# Patient Record
Sex: Male | Born: 1945 | Race: White | Hispanic: No | Marital: Married | Smoking: Former smoker
Health system: Southern US, Community
[De-identification: ages and names within clinical notes are randomized; demographics above are authoritative.]

## PROBLEM LIST (undated history)

## (undated) DIAGNOSIS — C801 Malignant (primary) neoplasm, unspecified: Secondary | ICD-10-CM

## (undated) DIAGNOSIS — I10 Essential (primary) hypertension: Secondary | ICD-10-CM

## (undated) DIAGNOSIS — J449 Chronic obstructive pulmonary disease, unspecified: Secondary | ICD-10-CM

## (undated) DIAGNOSIS — G4730 Sleep apnea, unspecified: Secondary | ICD-10-CM

## (undated) HISTORY — PX: EYE SURGERY: SHX253

## (undated) HISTORY — PX: CHOLECYSTECTOMY: SHX55

---

## 2009-01-17 ENCOUNTER — Ambulatory Visit: Payer: Self-pay | Admitting: Ophthalmology

## 2009-01-29 ENCOUNTER — Ambulatory Visit: Payer: Self-pay | Admitting: Ophthalmology

## 2014-11-06 ENCOUNTER — Ambulatory Visit: Payer: Self-pay | Admitting: Ophthalmology

## 2014-11-20 ENCOUNTER — Ambulatory Visit: Payer: Self-pay | Admitting: Ophthalmology

## 2015-03-20 NOTE — Op Note (Signed)
PATIENT NAME:  James Hodge, James Hodge MR#:  [id number] DATE OF BIRTH:  02-16-1946  DATE OF PROCEDURE:  11/20/2014  PREOPERATIVE DIAGNOSIS:  Nuclear sclerotic cataract of the right eye.   POSTOPERATIVE DIAGNOSIS:  Nuclear sclerotic cataract of the right eye.   OPERATIVE PROCEDURE:  Cataract extraction by phacoemulsification with implant of intraocular lens to right eye.   SURGEON:  Birder Robson, MD.   ANESTHESIA:  1. Managed anesthesia care.  2. Topical tetracaine drops followed by 2% Xylocaine jelly applied in the preoperative holding area.   COMPLICATIONS:  None.   TECHNIQUE:   Stop and chop.    DESCRIPTION OF PROCEDURE:  The patient was examined and consented in the preoperative holding area where the aforementioned topical anesthesia was applied to the right eye and then brought back to the Operating Room where the right eye was prepped and draped in the usual sterile ophthalmic fashion and a lid speculum was placed. A paracentesis was created with the side port blade and the anterior chamber was filled with viscoelastic. A near clear corneal incision was performed with the steel keratome. A continuous curvilinear capsulorrhexis was performed with a cystotome followed by the capsulorrhexis forceps. Hydrodissection and hydrodelineation were carried out with BSS on a blunt cannula. The lens was removed in a stop and chop technique and the remaining cortical material was removed with the irrigation-aspiration handpiece. The capsular bag was inflated with viscoelastic and the Alcon SN60WF 24.0-diopter lens, serial number [id number] was placed in the capsular bag without complication. The remaining viscoelastic was removed from the eye with the irrigation-aspiration handpiece. The wounds were hydrated. The anterior chamber was flushed with Miostat and the eye was inflated to physiologic pressure. 0.1 mL of cefuroxime concentration 10 mg/mL was placed in the anterior chamber. The wounds were found to be  water tight. The eye was dressed with Vigamox. The patient was given protective glasses to wear throughout the day and a shield with which to sleep tonight. The patient was also given drops with which to begin a drop regimen today and will follow-up with me in one day.    ____________________________ Livingston Diones. Lamont Glasscock, MD wlp:bu D: 11/20/2014 13:56:07 ET T: 11/20/2014 14:55:48 ET JOB#: [id number]  cc: Doroteo Nickolson L. Aurelius Gildersleeve, MD, <Dictator> Livingston Diones Avenir Lozinski MD ELECTRONICALLY SIGNED 11/26/2014 9:19

## 2020-03-26 ENCOUNTER — Other Ambulatory Visit: Payer: Self-pay | Admitting: General Surgery

## 2020-04-10 ENCOUNTER — Other Ambulatory Visit: Payer: Self-pay

## 2020-04-10 ENCOUNTER — Other Ambulatory Visit
Admission: RE | Admit: 2020-04-10 | Discharge: 2020-04-10 | Disposition: A | Discharge status: Home / Self Care | Payer: Medicare HMO | Source: Ambulatory Visit | Attending: General Surgery | Admitting: General Surgery

## 2020-04-10 DIAGNOSIS — Z01812 Encounter for preprocedural laboratory examination: Secondary | ICD-10-CM | POA: Diagnosis present

## 2020-04-10 DIAGNOSIS — Z20822 Contact with and (suspected) exposure to covid-19: Secondary | ICD-10-CM | POA: Insufficient documentation

## 2020-04-11 ENCOUNTER — Encounter: Payer: Self-pay | Admitting: General Surgery

## 2020-04-11 LAB — SARS CORONAVIRUS 2 (TAT 6-24 HRS): SARS Coronavirus 2: NEGATIVE

## 2020-04-12 ENCOUNTER — Ambulatory Visit: Payer: Medicare HMO | Admitting: Certified Registered Nurse Anesthetist

## 2020-04-12 ENCOUNTER — Encounter: Payer: Self-pay | Admitting: General Surgery

## 2020-04-12 ENCOUNTER — Other Ambulatory Visit: Payer: Self-pay | Admitting: General Surgery

## 2020-04-12 ENCOUNTER — Other Ambulatory Visit: Payer: Self-pay

## 2020-04-12 ENCOUNTER — Encounter
Admission: RE | Disposition: A | Discharge status: Home / Self Care | Payer: Self-pay | Source: Ambulatory Visit | Attending: General Surgery

## 2020-04-12 ENCOUNTER — Ambulatory Visit
Admission: RE | Admit: 2020-04-12 | Discharge: 2020-04-12 | Disposition: A | Discharge status: Home / Self Care | Payer: Medicare HMO | Source: Ambulatory Visit | Attending: General Surgery | Admitting: General Surgery

## 2020-04-12 DIAGNOSIS — R140 Abdominal distension (gaseous): Secondary | ICD-10-CM

## 2020-04-12 DIAGNOSIS — D509 Iron deficiency anemia, unspecified: Secondary | ICD-10-CM | POA: Diagnosis not present

## 2020-04-12 DIAGNOSIS — J449 Chronic obstructive pulmonary disease, unspecified: Secondary | ICD-10-CM | POA: Diagnosis not present

## 2020-04-12 DIAGNOSIS — K621 Rectal polyp: Secondary | ICD-10-CM | POA: Diagnosis not present

## 2020-04-12 DIAGNOSIS — Z79899 Other long term (current) drug therapy: Secondary | ICD-10-CM | POA: Insufficient documentation

## 2020-04-12 DIAGNOSIS — Z87891 Personal history of nicotine dependence: Secondary | ICD-10-CM | POA: Insufficient documentation

## 2020-04-12 HISTORY — DX: Chronic obstructive pulmonary disease, unspecified: J44.9

## 2020-04-12 HISTORY — PX: COLONOSCOPY WITH PROPOFOL: SHX5780

## 2020-04-12 HISTORY — PX: ESOPHAGOGASTRODUODENOSCOPY (EGD) WITH PROPOFOL: SHX5813

## 2020-04-12 SURGERY — COLONOSCOPY WITH PROPOFOL
Anesthesia: General

## 2020-04-12 MED ORDER — PROPOFOL 10 MG/ML IV BOLUS
INTRAVENOUS | Status: DC | PRN
  Administered 2020-04-12: 60 mg via INTRAVENOUS
  Administered 2020-04-12: 20 mg via INTRAVENOUS

## 2020-04-12 MED ORDER — PROPOFOL 500 MG/50ML IV EMUL
INTRAVENOUS | Status: AC
  Filled 2020-04-12: qty 50

## 2020-04-12 MED ORDER — PHENYLEPHRINE HCL (PRESSORS) 10 MG/ML IV SOLN
INTRAVENOUS | Status: DC | PRN
  Administered 2020-04-12: 100 ug via INTRAVENOUS

## 2020-04-12 MED ORDER — PHENYLEPHRINE HCL (PRESSORS) 10 MG/ML IV SOLN
INTRAVENOUS | Status: AC
  Filled 2020-04-12: qty 1

## 2020-04-12 MED ORDER — ONDANSETRON HCL 4 MG/2ML IJ SOLN
INTRAMUSCULAR | Status: DC | PRN
  Administered 2020-04-12: 4 mg via INTRAVENOUS

## 2020-04-12 MED ORDER — GLYCOPYRROLATE 0.2 MG/ML IJ SOLN
INTRAMUSCULAR | Status: AC
  Filled 2020-04-12: qty 1

## 2020-04-12 MED ORDER — LIDOCAINE HCL (CARDIAC) PF 100 MG/5ML IV SOSY
PREFILLED_SYRINGE | INTRAVENOUS | Status: DC | PRN
  Administered 2020-04-12: 100 mg via INTRAVENOUS

## 2020-04-12 MED ORDER — LIDOCAINE HCL (PF) 2 % IJ SOLN
INTRAMUSCULAR | Status: AC
  Filled 2020-04-12: qty 5

## 2020-04-12 MED ORDER — PROPOFOL 500 MG/50ML IV EMUL
INTRAVENOUS | Status: DC | PRN
  Administered 2020-04-12: 200 ug/kg/min via INTRAVENOUS

## 2020-04-12 MED ORDER — SODIUM CHLORIDE 0.9 % IV SOLN
INTRAVENOUS | Status: DC
  Administered 2020-04-12: 1000 mL via INTRAVENOUS

## 2020-04-12 MED ORDER — ONDANSETRON HCL 4 MG/2ML IJ SOLN
INTRAMUSCULAR | Status: AC
  Filled 2020-04-12: qty 2

## 2020-04-12 NOTE — Anesthesia Preprocedure Evaluation (Signed)
Anesthesia Evaluation  Patient identified by MRN, date of birth, ID band Patient awake    Reviewed: Allergy & Precautions, H&P , NPO status , Patient's Chart, lab work & pertinent test results, reviewed documented beta blocker date and time   Airway Mallampati: II   Neck ROM: full    Dental  (+) Poor Dentition   Pulmonary COPD,  COPD inhaler, former smoker,    Pulmonary exam normal        Cardiovascular Exercise Tolerance: Good negative cardio ROS Normal cardiovascular exam Rhythm:regular Rate:Normal     Neuro/Psych negative neurological ROS  negative psych ROS   GI/Hepatic negative GI ROS, Neg liver ROS,   Endo/Other  negative endocrine ROS  Renal/GU negative Renal ROS  negative genitourinary   Musculoskeletal   Abdominal   Peds  Hematology negative hematology ROS (+)   Anesthesia Other Findings Past Medical History: No date: COPD (chronic obstructive pulmonary disease) ([place]) Past Surgical History: No date: CHOLECYSTECTOMY     Comment:  Around 2001 No date: EYE SURGERY BMI    Body Mass Index: 27.76 kg/m     Reproductive/Obstetrics negative OB ROS                             Anesthesia Physical Anesthesia Plan  ASA: III  Anesthesia Plan: General   Post-op Pain Management:    Induction:   PONV Risk Score and Plan:   Airway Management Planned:   Additional Equipment:   Intra-op Plan:   Post-operative Plan:   Informed Consent: I have reviewed the patients History and Physical, chart, labs and discussed the procedure including the risks, benefits and alternatives for the proposed anesthesia with the patient or authorized representative who has indicated his/her understanding and acceptance.     Dental Advisory Given  Plan Discussed with: CRNA  Anesthesia Plan Comments:         Anesthesia Quick Evaluation

## 2020-04-12 NOTE — Op Note (Signed)
[facility] Gastroenterology Patient Name: James Hodge Procedure Date: 04/12/2020 7:21 AM MRN: [id number] Account #: [id number] Date of Birth: [birth year] Admit Type: Outpatient Age: 74 Room: [facility] ENDO ROOM 1 Gender: Male Note Status: Finalized Procedure:             Upper GI endoscopy Indications:           Iron deficiency anemia Providers:             Robert Bellow, MD Referring MD:          Leona Carry. Hall Busing, MD (Referring MD) Medicines:             Monitored Anesthesia Care Complications:         No immediate complications. Procedure:             Pre-Anesthesia Assessment:                        - Prior to the procedure, a History and Physical was                         performed, and patient medications, allergies and                         sensitivities were reviewed. The patient's tolerance                         of previous anesthesia was reviewed.                        - The risks and benefits of the procedure and the                         sedation options and risks were discussed with the                         patient. All questions were answered and informed                         consent was obtained.                        After obtaining informed consent, the endoscope was                         passed under direct vision. Throughout the procedure,                         the patient's blood pressure, pulse, and oxygen                         saturations were monitored continuously. The Endoscope                         was introduced through the mouth, and advanced to the                         third part of duodenum. The upper GI endoscopy was  accomplished without difficulty. The patient tolerated                         the procedure well. Findings:      The esophagus was normal.      The stomach was normal.      The examined duodenum was normal. Impression:            - Normal esophagus.    - Normal stomach.                        - Normal examined duodenum.                        - No specimens collected. Recommendation:        - Perform a colonoscopy today. Procedure Code(s):     --- Professional ---                        [phone], Esophagogastroduodenoscopy, flexible,                         transoral; diagnostic, including collection of                         specimen(s) by brushing or washing, when performed                         (separate procedure) Diagnosis Code(s):     --- Professional ---                        D50.9, Iron deficiency anemia, unspecified CPT copyright 2019 American Medical Association. All rights reserved. The codes documented in this report are preliminary and upon coder review may  be revised to meet current compliance requirements. Robert Bellow, MD 04/12/2020 8:14:53 AM This report has been signed electronically. Number of Addenda: 0 Note Initiated On: 04/12/2020 7:21 AM      [facility]

## 2020-04-12 NOTE — Op Note (Signed)
[facility] Gastroenterology Patient Name: James Hodge Procedure Date: 04/12/2020 7:20 AM MRN: [id number] Account #: [id number] Date of Birth: 05-12-1946 Admit Type: Outpatient Age: 74 Room: [facility] ENDO ROOM 1 Gender: Male Note Status: Finalized Procedure:             Colonoscopy Indications:           Iron deficiency anemia Providers:             Robert Bellow, MD Referring MD:          Leona Carry. Hall Busing, MD (Referring MD) Medicines:             Monitored Anesthesia Care Complications:         No immediate complications. Procedure:             Pre-Anesthesia Assessment:                        - Prior to the procedure, a History and Physical was                         performed, and patient medications, allergies and                         sensitivities were reviewed. The patient's tolerance                         of previous anesthesia was reviewed.                        - The risks and benefits of the procedure and the                         sedation options and risks were discussed with the                         patient. All questions were answered and informed                         consent was obtained.                        After obtaining informed consent, the colonoscope was                         passed under direct vision. Throughout the procedure,                         the patient's blood pressure, pulse, and oxygen                         saturations were monitored continuously. The                         Colonoscope was introduced through the anus with the                         intention of advancing to the ascending colon. The                         scope was advanced  to the hepatic flexure before the                         procedure was aborted. Medications were given. The                         colonoscopy was somewhat difficult due to inadequate                         bowel prep. Successful completion of the procedure was                    aided by changing the patient to a supine position.                         The patient tolerated the procedure well. The quality                         of the bowel preparation was unsatisfactory. Findings:      A 12 mm polyp was found in the rectum. The polyp was sessile. Impression:            - Preparation of the colon was unsatisfactory.                        - One 12 mm polyp in the rectum.                        - No specimens collected. Recommendation:        - Repeat colonoscopy at appointment to be scheduled                         because the examination was incomplete and because the                         bowel preparation was poor. Procedure Code(s):     --- Professional ---                        [phone], 38, Colonoscopy, flexible; diagnostic,                         including collection of specimen(s) by brushing or                         washing, when performed (separate procedure) Diagnosis Code(s):     --- Professional ---                        K62.1, Rectal polyp                        D50.9, Iron deficiency anemia, unspecified CPT copyright 2019 American Medical Association. All rights reserved. The codes documented in this report are preliminary and upon coder review may  be revised to meet current compliance requirements. Robert Bellow, MD 04/12/2020 8:43:50 AM This report has been signed electronically. Number of Addenda: 0 Note Initiated On: 04/12/2020 7:20 AM Total Procedure Duration: 0 hours 23 minutes 32 seconds  Estimated Blood Loss:  Estimated blood loss: none.      [place]  [facility]

## 2020-04-12 NOTE — H&P (Signed)
Kalyan Scavetta [id number] July 14, 1946     HPI:  New onset anemia, HGB 8.2.  Vomited with prep, but reports stools clear. For upper and lower endoscopy.   Medications Prior to Admission  Medication Sig Dispense Refill Last Dose  . lisinopril (ZESTRIL) 10 MG tablet Take 10 mg by mouth daily.   04/11/2020 at Unknown time  . tiotropium (SPIRIVA) 18 MCG inhalation capsule Place 18 mcg into inhaler and inhale as needed.   04/11/2020 at Unknown time   Not on File Past Medical History:  Diagnosis Date  . COPD (chronic obstructive pulmonary disease) ([place])    Past Surgical History:  Procedure Laterality Date  . CHOLECYSTECTOMY     Around 2001  . EYE SURGERY     Social History   Socioeconomic History  . Marital status: Married    Spouse name: Not on file  . Number of children: Not on file  . Years of education: Not on file  . Highest education level: Not on file  Occupational History  . Not on file  Tobacco Use  . Smoking status: Former Smoker    Years: 3.00    Types: Cigarettes    Quit date: 11/23/1968    Years since quitting: 51.4  . Smokeless tobacco: Never Used  Substance and Sexual Activity  . Alcohol use: Yes  . Drug use: Never  . Sexual activity: Not on file  Other Topics Concern  . Not on file  Social History Narrative  . Not on file   Social Determinants of Health   Financial Resource Strain:   . Difficulty of Paying Living Expenses:   Food Insecurity:   . Worried About Charity fundraiser in the Last Year:   . Arboriculturist in the Last Year:   Transportation Needs:   . Film/video editor (Medical):   Marland Kitchen Lack of Transportation (Non-Medical):   Physical Activity:   . Days of Exercise per Week:   . Minutes of Exercise per Session:   Stress:   . Feeling of Stress :   Social Connections:   . Frequency of Communication with Friends and Family:   . Frequency of Social Gatherings with Friends and Family:   . Attends Religious Services:   . Active Member of Clubs  or Organizations:   . Attends Archivist Meetings:   Marland Kitchen Marital Status:   Intimate Partner Violence:   . Fear of Current or Ex-Partner:   . Emotionally Abused:   Marland Kitchen Physically Abused:   . Sexually Abused:    Social History   Social History Narrative  . Not on file     ROS: Negative.     PE: HEENT: Negative. Lungs: Clear. Cardio: RR.   Assessment/Plan:  Proceed with planned endoscopy.   Forest Gleason [facility] 04/12/2020

## 2020-04-12 NOTE — Transfer of Care (Signed)
Immediate Anesthesia Transfer of Care Note  Patient: James Hodge  Procedure(s) Performed: COLONOSCOPY WITH PROPOFOL (N/A ) ESOPHAGOGASTRODUODENOSCOPY (EGD) WITH PROPOFOL (N/A )  Patient Location: PACU  Anesthesia Type:General  Level of Consciousness: sedated  Airway & Oxygen Therapy: Patient Spontanous Breathing  Post-op Assessment: Report given to RN and Post -op Vital signs reviewed and stable  Post vital signs: Reviewed and stable  Last Vitals:  Vitals Value Taken Time  BP    Temp    Pulse    Resp    SpO2      Last Pain:  Vitals:   04/12/20 0705  TempSrc: Temporal  PainSc: 0-No pain         Complications: No apparent anesthesia complications

## 2020-04-12 NOTE — Anesthesia Postprocedure Evaluation (Signed)
Anesthesia Post Note  Patient: James Hodge  Procedure(s) Performed: COLONOSCOPY WITH PROPOFOL (N/A ) ESOPHAGOGASTRODUODENOSCOPY (EGD) WITH PROPOFOL (N/A )  Patient location during evaluation: PACU Anesthesia Type: General Level of consciousness: awake and alert Pain management: pain level controlled Vital Signs Assessment: post-procedure vital signs reviewed and stable Respiratory status: spontaneous breathing, nonlabored ventilation, respiratory function stable and patient connected to nasal cannula oxygen Cardiovascular status: blood pressure returned to baseline and stable Postop Assessment: no apparent nausea or vomiting Anesthetic complications: no     Last Vitals:  Vitals:   04/12/20 0855 04/12/20 0905  BP: 103/63 108/62  Pulse: 84 80  Resp: 12   Temp:    SpO2: 96% 100%    Last Pain:  Vitals:   04/12/20 0905  TempSrc:   PainSc: 0-No pain                 Molli Barrows

## 2020-04-15 ENCOUNTER — Other Ambulatory Visit: Payer: Self-pay | Admitting: General Surgery

## 2020-04-15 ENCOUNTER — Encounter: Payer: Self-pay | Admitting: *Deleted

## 2020-04-19 ENCOUNTER — Other Ambulatory Visit: Payer: Self-pay

## 2020-04-19 ENCOUNTER — Ambulatory Visit
Admission: RE | Admit: 2020-04-19 | Discharge: 2020-04-19 | Disposition: A | Discharge status: Home / Self Care | Payer: Medicare HMO | Source: Ambulatory Visit | Attending: General Surgery | Admitting: General Surgery

## 2020-04-19 DIAGNOSIS — D509 Iron deficiency anemia, unspecified: Secondary | ICD-10-CM | POA: Diagnosis present

## 2020-04-19 DIAGNOSIS — R140 Abdominal distension (gaseous): Secondary | ICD-10-CM

## 2020-04-23 ENCOUNTER — Encounter: Payer: Self-pay | Admitting: General Surgery

## 2020-04-23 ENCOUNTER — Other Ambulatory Visit: Payer: Self-pay

## 2020-04-23 ENCOUNTER — Other Ambulatory Visit
Admission: RE | Admit: 2020-04-23 | Discharge: 2020-04-23 | Disposition: A | Discharge status: Home / Self Care | Payer: Medicare HMO | Source: Ambulatory Visit | Attending: General Surgery | Admitting: General Surgery

## 2020-04-23 DIAGNOSIS — Z20822 Contact with and (suspected) exposure to covid-19: Secondary | ICD-10-CM | POA: Diagnosis not present

## 2020-04-23 DIAGNOSIS — Z01812 Encounter for preprocedural laboratory examination: Secondary | ICD-10-CM | POA: Insufficient documentation

## 2020-04-23 LAB — SARS CORONAVIRUS 2 (TAT 6-24 HRS): SARS Coronavirus 2: NEGATIVE

## 2020-04-24 ENCOUNTER — Other Ambulatory Visit
Admission: RE | Admit: 2020-04-24 | Discharge: 2020-04-24 | Disposition: A | Discharge status: Home / Self Care | Payer: Medicare HMO | Source: Home / Self Care | Attending: General Surgery | Admitting: General Surgery

## 2020-04-24 ENCOUNTER — Ambulatory Visit: Payer: Medicare HMO | Admitting: Certified Registered Nurse Anesthetist

## 2020-04-24 ENCOUNTER — Ambulatory Visit
Admission: RE | Admit: 2020-04-24 | Discharge: 2020-04-24 | Disposition: A | Discharge status: Home / Self Care | Payer: Medicare HMO | Attending: General Surgery | Admitting: General Surgery

## 2020-04-24 ENCOUNTER — Other Ambulatory Visit: Payer: Self-pay

## 2020-04-24 ENCOUNTER — Encounter
Admission: RE | Disposition: A | Discharge status: Home / Self Care | Payer: Self-pay | Source: Home / Self Care | Attending: General Surgery

## 2020-04-24 DIAGNOSIS — K5669 Other partial intestinal obstruction: Secondary | ICD-10-CM | POA: Insufficient documentation

## 2020-04-24 DIAGNOSIS — Z87891 Personal history of nicotine dependence: Secondary | ICD-10-CM | POA: Insufficient documentation

## 2020-04-24 DIAGNOSIS — Z79899 Other long term (current) drug therapy: Secondary | ICD-10-CM | POA: Diagnosis not present

## 2020-04-24 DIAGNOSIS — J449 Chronic obstructive pulmonary disease, unspecified: Secondary | ICD-10-CM | POA: Diagnosis not present

## 2020-04-24 DIAGNOSIS — C182 Malignant neoplasm of ascending colon: Secondary | ICD-10-CM | POA: Insufficient documentation

## 2020-04-24 DIAGNOSIS — D509 Iron deficiency anemia, unspecified: Secondary | ICD-10-CM | POA: Insufficient documentation

## 2020-04-24 HISTORY — PX: COLONOSCOPY WITH PROPOFOL: SHX5780

## 2020-04-24 SURGERY — COLONOSCOPY WITH PROPOFOL
Anesthesia: General

## 2020-04-24 MED ORDER — PROPOFOL 500 MG/50ML IV EMUL
INTRAVENOUS | Status: DC | PRN
  Administered 2020-04-24: 150 ug/kg/min via INTRAVENOUS

## 2020-04-24 MED ORDER — PROPOFOL 500 MG/50ML IV EMUL
INTRAVENOUS | Status: AC
  Filled 2020-04-24: qty 50

## 2020-04-24 MED ORDER — PROPOFOL 10 MG/ML IV BOLUS
INTRAVENOUS | Status: DC | PRN
  Administered 2020-04-24: 20 mg via INTRAVENOUS
  Administered 2020-04-24: 80 mg via INTRAVENOUS

## 2020-04-24 MED ORDER — LIDOCAINE HCL (PF) 2 % IJ SOLN
INTRAMUSCULAR | Status: AC
  Filled 2020-04-24: qty 5

## 2020-04-24 MED ORDER — ONDANSETRON HCL 4 MG/2ML IJ SOLN
INTRAMUSCULAR | Status: AC
  Filled 2020-04-24: qty 2

## 2020-04-24 MED ORDER — ONDANSETRON HCL 4 MG/2ML IJ SOLN
INTRAMUSCULAR | Status: DC | PRN
  Administered 2020-04-24: 4 mg via INTRAVENOUS

## 2020-04-24 MED ORDER — SODIUM CHLORIDE 0.9 % IV SOLN
INTRAVENOUS | Status: DC
  Administered 2020-04-24: 1000 mL via INTRAVENOUS

## 2020-04-24 MED ORDER — LIDOCAINE HCL (CARDIAC) PF 100 MG/5ML IV SOSY
PREFILLED_SYRINGE | INTRAVENOUS | Status: DC | PRN
  Administered 2020-04-24: 50 mg via INTRAVENOUS

## 2020-04-24 MED ORDER — PHENYLEPHRINE HCL (PRESSORS) 10 MG/ML IV SOLN
INTRAVENOUS | Status: DC | PRN
  Administered 2020-04-24: 200 ug via INTRAVENOUS
  Administered 2020-04-24 (×4): 100 ug via INTRAVENOUS

## 2020-04-24 NOTE — Op Note (Signed)
[facility] Gastroenterology Patient Name: James Hodge Procedure Date: 04/24/2020 11:17 AM MRN: [id number] Account #: [id number] Date of Birth: 1946-07-03 Admit Type: Outpatient Age: 74 Room: [facility] ENDO ROOM 1 Gender: Male Note Status: Finalized Procedure:             Colonoscopy Indications:           Iron deficiency anemia Providers:             Robert Bellow, MD Referring MD:          Leona Carry. Hall Busing, MD (Referring MD) Medicines:             Monitored Anesthesia Care Complications:         No immediate complications. Procedure:             Pre-Anesthesia Assessment:                        - Prior to the procedure, a History and Physical was                         performed, and patient medications, allergies and                         sensitivities were reviewed. The patient's tolerance                         of previous anesthesia was reviewed.                        - The risks and benefits of the procedure and the                         sedation options and risks were discussed with the                         patient. All questions were answered and informed                         consent was obtained.                        After obtaining informed consent, the colonoscope was                         passed under direct vision. Throughout the procedure,                         the patient's blood pressure, pulse, and oxygen                         saturations were monitored continuously. The                         Colonoscope was introduced through the anus and                         advanced to the the cecum, identified by the ileocecal                         valve. The colonoscopy  was performed without                         difficulty. The patient tolerated the procedure well.                         The quality of the bowel preparation was good. Findings:      A 12 mm polyp was found in the rectum. The polyp was sessile. The polyp       was  removed with a hot snare. Resection and retrieval were complete.      A polypoid partially obstructing large mass was found in the ascending       colon. The mass was circumferential. No bleeding was present. Biopsies       were taken with a cold forceps for histology.      The retroflexed view of the distal rectum and anal verge was normal and       showed no anal or rectal abnormalities. Impression:            - One 12 mm polyp in the rectum, removed with a hot                         snare. Resected and retrieved.                        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 Biopsied.                        - The distal rectum and anal verge are normal on                         retroflexion view. Recommendation:        - Telephone endoscopist for pathology results in 1                         week. Procedure Code(s):     --- Professional ---                        [phone], Colonoscopy, flexible; with removal of                         tumor(s), polyp(s), or other lesion(s) by snare                         technique                        [id number], 59, Colonoscopy, flexible; with biopsy, single                         or multiple Diagnosis Code(s):     --- Professional ---                        K62.1, Rectal polyp                        C18.2, Malignant  neoplasm of ascending colon                        K56.690, Other partial intestinal obstruction                        D50.9, Iron deficiency anemia, unspecified CPT copyright 2019 American Medical Association. All rights reserved. The codes documented in this report are preliminary and upon coder review may  be revised to meet current compliance requirements. Robert Bellow, MD 04/24/2020 11:59:58 AM This report has been signed electronically. Number of Addenda: 0 Note Initiated On: 04/24/2020 11:17 AM Scope Withdrawal Time: 0 hours 15 minutes 40 seconds  Total Procedure Duration: 0  hours 26 minutes 5 seconds       [facility]

## 2020-04-24 NOTE — Transfer of Care (Signed)
Immediate Anesthesia Transfer of Care Note  Patient: James Hodge  Procedure(s) Performed: COLONOSCOPY WITH PROPOFOL (N/A )  Patient Location: PACU  Anesthesia Type:General  Level of Consciousness: sedated  Airway & Oxygen Therapy: Patient Spontanous Breathing and Patient connected to nasal cannula oxygen  Post-op Assessment: Report given to RN and Post -op Vital signs reviewed and stable  Post vital signs: Reviewed and stable  Last Vitals:  Vitals Value Taken Time  BP 93/57 04/24/20 1205  Temp    Pulse 73 04/24/20 1205  Resp 15 04/24/20 1205  SpO2 100 % 04/24/20 1205    Last Pain:  Vitals:   04/24/20 1202  TempSrc:   PainSc: Asleep         Complications: No apparent anesthesia complications

## 2020-04-24 NOTE — Anesthesia Procedure Notes (Signed)
Date/Time: 04/24/2020 11:20 AM Performed by: Johnna Acosta, CRNA Pre-anesthesia Checklist: Patient identified, Emergency Drugs available, Suction available, Patient being monitored and Timeout performed Patient Re-evaluated:Patient Re-evaluated prior to induction Oxygen Delivery Method: Nasal cannula Preoxygenation: Pre-oxygenation with 100% oxygen Induction Type: IV induction

## 2020-04-24 NOTE — Anesthesia Preprocedure Evaluation (Signed)
Anesthesia Evaluation  Patient identified by MRN, date of birth, ID band Patient awake    Reviewed: Allergy & Precautions, H&P , NPO status , Patient's Chart, lab work & pertinent test results, reviewed documented beta blocker date and time   History of Anesthesia Complications Negative for: history of anesthetic complications  Airway Mallampati: II  TM Distance: >3 FB Neck ROM: full    Dental  (+) Edentulous Lower, Edentulous Upper   Pulmonary neg sleep apnea, COPD,  COPD inhaler, Patient abstained from smoking.Not current smoker, former smoker,    Pulmonary exam normal breath sounds clear to auscultation       Cardiovascular Exercise Tolerance: Good METShypertension, (-) CAD and (-) Past MI Normal cardiovascular exam(-) dysrhythmias  Rhythm:regular Rate:Normal     Neuro/Psych negative neurological ROS  negative psych ROS   GI/Hepatic negative GI ROS, Neg liver ROS, neg GERD  ,  Endo/Other  negative endocrine ROSneg diabetes  Renal/GU negative Renal ROS  negative genitourinary   Musculoskeletal   Abdominal   Peds  Hematology negative hematology ROS (+)   Anesthesia Other Findings Past Medical History: No date: COPD (chronic obstructive pulmonary disease) ([facility]) Past Surgical History: No date: CHOLECYSTECTOMY     Comment:  Around 2001 No date: EYE SURGERY BMI    Body Mass Index: 27.76 kg/m     Reproductive/Obstetrics negative OB ROS                             Anesthesia Physical  Anesthesia Plan  ASA: II  Anesthesia Plan: General   Post-op Pain Management:    Induction: Intravenous  PONV Risk Score and Plan: 2 and Ondansetron, Propofol infusion and TIVA  Airway Management Planned: Nasal Cannula  Additional Equipment: None  Intra-op Plan:   Post-operative Plan:   Informed Consent: I have reviewed the patients History and Physical, chart, labs and discussed the  procedure including the risks, benefits and alternatives for the proposed anesthesia with the patient or authorized representative who has indicated his/her understanding and acceptance.     Dental Advisory Given  Plan Discussed with: CRNA  Anesthesia Plan Comments: (Discussed risks of anesthesia with patient, including possibility of difficulty with spontaneous ventilation under anesthesia necessitating airway intervention, PONV, and rare risks such as cardiac or respiratory or neurological events. Patient understands.)        Anesthesia Quick Evaluation

## 2020-04-24 NOTE — H&P (Signed)
James Hodge [id number] [birth year]     HPI: Patient with poor prep on May 21. Exam for anemia, microcytic.  Recent SBFT suggests abnormality in the cecum.  Tolerated re-prep well. For colonoscopy.   Medications Prior to Admission  Medication Sig Dispense Refill Last Dose  . lisinopril (ZESTRIL) 10 MG tablet Take 10 mg by mouth daily.   Past Week at Unknown time  . tiotropium (SPIRIVA) 18 MCG inhalation capsule Place 18 mcg into inhaler and inhale as needed.   Past Week at Unknown time   Not on File Past Medical History:  Diagnosis Date  . COPD (chronic obstructive pulmonary disease) ([place])    Past Surgical History:  Procedure Laterality Date  . CHOLECYSTECTOMY     Around 2001  . COLONOSCOPY WITH PROPOFOL N/A 04/12/2020   Procedure: COLONOSCOPY WITH PROPOFOL;  Surgeon: Robert Bellow, MD;  Location: [facility] ENDOSCOPY;  Service: Endoscopy;  Laterality: N/A;  . ESOPHAGOGASTRODUODENOSCOPY (EGD) WITH PROPOFOL N/A 04/12/2020   Procedure: ESOPHAGOGASTRODUODENOSCOPY (EGD) WITH PROPOFOL;  Surgeon: Robert Bellow, MD;  Location: [facility] ENDOSCOPY;  Service: Endoscopy;  Laterality: N/A;  . EYE SURGERY     Social History   Socioeconomic History  . Marital status: Married    Spouse name: Not on file  . Number of children: Not on file  . Years of education: Not on file  . Highest education level: Not on file  Occupational History  . Not on file  Tobacco Use  . Smoking status: Former Smoker    Years: 3.00    Types: Cigarettes    Quit date: 11/23/1968    Years since quitting: 51.4  . Smokeless tobacco: Never Used  Substance and Sexual Activity  . Alcohol use: Yes  . Drug use: Never  . Sexual activity: Not on file  Other Topics Concern  . Not on file  Social History Narrative  . Not on file   Social Determinants of Health   Financial Resource Strain:   . Difficulty of Paying Living Expenses:   Food Insecurity:   . Worried About Charity fundraiser in the Last Year:   . Youth worker in the Last Year:   Transportation Needs:   . Film/video editor (Medical):   Marland Kitchen Lack of Transportation (Non-Medical):   Physical Activity:   . Days of Exercise per Week:   . Minutes of Exercise per Session:   Stress:   . Feeling of Stress :   Social Connections:   . Frequency of Communication with Friends and Family:   . Frequency of Social Gatherings with Friends and Family:   . Attends Religious Services:   . Active Member of Clubs or Organizations:   . Attends Archivist Meetings:   Marland Kitchen Marital Status:   Intimate Partner Violence:   . Fear of Current or Ex-Partner:   . Emotionally Abused:   Marland Kitchen Physically Abused:   . Sexually Abused:    Social History   Social History Narrative  . Not on file     ROS: Negative.     PE: HEENT: Negative. Lungs: Clear. Cardio: RR.   Assessment/Plan:  Proceed with planned endoscopy.   Forest Gleason [facility] 04/24/2020

## 2020-04-24 NOTE — Anesthesia Postprocedure Evaluation (Signed)
Anesthesia Post Note  Patient: James Hodge  Procedure(s) Performed: COLONOSCOPY WITH PROPOFOL (N/A )  Patient location during evaluation: Endoscopy Anesthesia Type: General Level of consciousness: awake and alert Pain management: pain level controlled Vital Signs Assessment: post-procedure vital signs reviewed and stable Respiratory status: spontaneous breathing, nonlabored ventilation, respiratory function stable and patient connected to nasal cannula oxygen Cardiovascular status: blood pressure returned to baseline and stable Postop Assessment: no apparent nausea or vomiting Anesthetic complications: no     Last Vitals:  Vitals:   04/24/20 1222 04/24/20 1232  BP: 104/62 101/63  Pulse:    Resp: 16   Temp:    SpO2:      Last Pain:  Vitals:   04/24/20 1232  TempSrc:   PainSc: 0-No pain                 Arita Miss

## 2020-04-25 ENCOUNTER — Other Ambulatory Visit: Payer: Self-pay | Admitting: General Surgery

## 2020-04-25 ENCOUNTER — Encounter: Payer: Self-pay | Admitting: *Deleted

## 2020-04-25 ENCOUNTER — Other Ambulatory Visit: Payer: Self-pay | Admitting: Anatomic Pathology & Clinical Pathology

## 2020-04-25 DIAGNOSIS — C182 Malignant neoplasm of ascending colon: Secondary | ICD-10-CM

## 2020-04-25 LAB — CEA: CEA: 2.8 ng/mL (ref 0.0–4.7)

## 2020-04-25 LAB — SURGICAL PATHOLOGY

## 2020-04-25 NOTE — Progress Notes (Signed)
ct 

## 2020-05-02 ENCOUNTER — Other Ambulatory Visit: Payer: Medicare HMO

## 2020-05-02 NOTE — Progress Notes (Signed)
Tumor Board Documentation  James Hodge was presented by Dr Bary Castilla at our Tumor Board on 05/02/2020, which included representatives from medical oncology, radiation oncology, surgical, radiology, pathology, navigation, internal medicine, research, pharmacy, genetics.  James Hodge currently presents as an external consult, for [place], for new positive pathology with history of the following treatments: surgical intervention(s).  Additionally, we reviewed previous medical and familial history, history of present illness, and recent lab results along with all available histopathologic and imaging studies. The tumor board considered available treatment options and made the following recommendations: Additional screening, Surgery    The following procedures/referrals were also placed: No orders of the defined types were placed in this encounter.   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  Tumor board is a meeting of clinicians from various specialty areas who evaluate and discuss patients for whom a multidisciplinary approach is being considered. Final determinations in the plan of care are those of the provider(s). The responsibility for follow up of recommendations given during tumor board is that of the provider.   Today's extended care, comprehensive team conference, Jacoby was not present for the discussion and was not examined.   Multidisciplinary Tumor Board is a multidisciplinary case peer review process.  Decisions discussed in the Multidisciplinary Tumor Board reflect the opinions of the specialists present at the conference without having examined the patient.  Ultimately, treatment and diagnostic decisions rest with the primary provider(s) and the patient.

## 2020-05-06 ENCOUNTER — Other Ambulatory Visit: Payer: Self-pay

## 2020-05-06 ENCOUNTER — Ambulatory Visit
Admission: RE | Admit: 2020-05-06 | Discharge: 2020-05-06 | Disposition: A | Discharge status: Home / Self Care | Payer: Medicare HMO | Source: Ambulatory Visit | Attending: General Surgery | Admitting: General Surgery

## 2020-05-06 DIAGNOSIS — C182 Malignant neoplasm of ascending colon: Secondary | ICD-10-CM | POA: Diagnosis present

## 2020-05-06 HISTORY — DX: Malignant (primary) neoplasm, unspecified: C80.1

## 2020-05-06 LAB — POCT I-STAT CREATININE: Creatinine, Ser: 0.7 mg/dL (ref 0.61–1.24)

## 2020-05-06 MED ORDER — IOHEXOL 300 MG/ML  SOLN
100.0000 mL | Freq: Once | INTRAMUSCULAR | Status: AC | PRN
  Administered 2020-05-06: 100 mL via INTRAVENOUS

## 2020-05-09 ENCOUNTER — Other Ambulatory Visit: Payer: Self-pay | Admitting: General Surgery

## 2020-05-14 ENCOUNTER — Other Ambulatory Visit: Payer: Self-pay

## 2020-05-14 ENCOUNTER — Encounter
Admission: RE | Admit: 2020-05-14 | Discharge: 2020-05-14 | Disposition: A | Discharge status: Home / Self Care | Payer: Medicare HMO | Source: Ambulatory Visit | Attending: General Surgery | Admitting: General Surgery

## 2020-05-14 DIAGNOSIS — Z20822 Contact with and (suspected) exposure to covid-19: Secondary | ICD-10-CM | POA: Insufficient documentation

## 2020-05-14 DIAGNOSIS — Z01818 Encounter for other preprocedural examination: Secondary | ICD-10-CM | POA: Diagnosis present

## 2020-05-14 HISTORY — DX: Essential (primary) hypertension: I10

## 2020-05-14 MED ORDER — ONDANSETRON HCL 4 MG/2ML IJ SOLN
4.0000 mg | Freq: Once | INTRAMUSCULAR | Status: DC | PRN
  Filled 2020-05-14: qty 2

## 2020-05-14 NOTE — Patient Instructions (Signed)
Your procedure is scheduled on: Mon. 6/28 Report to Day Surgery. To find out your arrival time please call [phone] between 1PM - 3PM on Friday 6/25 .  Remember: Instructions that are not followed completely may result in serious medical risk,  up to and including death, or upon the discretion of your surgeon and anesthesiologist your  surgery may need to be rescheduled.     _X__ 1. Do not eat food after midnight the night before your procedure.                 No gum chewing or hard candies. You may drink clear liquids up to 2 hours                 before you are scheduled to arrive for your surgery- DO not drink clear                 liquids within 2 hours of the start of your surgery.                 Clear Liquids include:  water, apple juice without pulp, clear Gatorade, G2 or                  Gatorade Zero (avoid Red/Purple/Blue), Black Coffee or Tea (Do not add                 anything to coffee or tea). _____2.   Complete the carbohydrate drink provided to you, 2 hours before arrival.  __X__2.  On the morning of surgery brush your teeth with toothpaste and water, you                may rinse your mouth with mouthwash if you wish.  Do not swallow any toothpaste of mouthwash.     ___ 3.  No Alcohol for 24 hours before or after surgery.   ___ 4.  Do Not Smoke or use e-cigarettes For 24 Hours Prior to Your Surgery.                 Do not use any chewable tobacco products for at least 6 hours prior to                 Surgery.  ___  5.  Do not use any recreational drugs (marijuana, cocaine, heroin, ecstacy, MDMA or other)                For at least one week prior to your surgery.  Combination of these drugs with anesthesia                May have life threatening results.  ____  6.  Bring all medications with you on the day of surgery if instructed.   __x__  7.  Notify your doctor if there is any change in your medical condition      (cold, fever,  infections).     Do not wear jewelry, Do not wear lotions, powders, . Do not shave 48 hours prior to surgery. Men may shave face and neck. Do not bring valuables to the hospital.    [facility] is not responsible for any belongings or valuables.  Contacts, dentures or bridgework may not be worn into surgery. Leave your suitcase in the car. After surgery it may be brought to your room. For patients admitted to the hospital, discharge time is determined by your treatment team.   Patients discharged the day of surgery will  not be allowed to drive home.   Make arrangements for someone to be with you for the first 24 hours of your Same Day Discharge.    Please read over the following fact sheets that you were given:    _x___ Take these medicines the morning of surgery with A SIP OF WATER:    1. none  2.   3.   4.  5.  6.  ____ Fleet Enema (as directed)   _x___ Use CHG Soap (or wipes) as directed  ____ Use Benzoyl Peroxide Gel as instructed  ____ Use inhalers on the day of surgery  ____ Stop metformin 2 days prior to surgery    ____ Take 1/2 of usual insulin dose the night before surgery. No insulin the morning          of surgery.   ____ Stop Coumadin/Plavix/aspirin on   _x___ Stop Anti-inflammatories ibuprofen aleve and aspirin products today   ____ Stop supplements until after surgery.    ____ Bring C-Pap to the hospital.

## 2020-05-16 ENCOUNTER — Other Ambulatory Visit: Payer: Self-pay

## 2020-05-16 ENCOUNTER — Encounter
Admission: RE | Admit: 2020-05-16 | Discharge: 2020-05-16 | Disposition: A | Discharge status: Home / Self Care | Payer: Medicare HMO | Source: Ambulatory Visit | Attending: General Surgery | Admitting: General Surgery

## 2020-05-16 ENCOUNTER — Other Ambulatory Visit: Payer: Medicare HMO

## 2020-05-16 ENCOUNTER — Other Ambulatory Visit: Payer: Self-pay | Admitting: General Surgery

## 2020-05-16 DIAGNOSIS — R9431 Abnormal electrocardiogram [ECG] [EKG]: Secondary | ICD-10-CM | POA: Insufficient documentation

## 2020-05-16 DIAGNOSIS — Z01818 Encounter for other preprocedural examination: Secondary | ICD-10-CM | POA: Diagnosis not present

## 2020-05-16 DIAGNOSIS — Z20822 Contact with and (suspected) exposure to covid-19: Secondary | ICD-10-CM | POA: Insufficient documentation

## 2020-05-16 DIAGNOSIS — R7989 Other specified abnormal findings of blood chemistry: Secondary | ICD-10-CM | POA: Insufficient documentation

## 2020-05-16 LAB — SARS CORONAVIRUS 2 (TAT 6-24 HRS): SARS Coronavirus 2: NEGATIVE

## 2020-05-16 LAB — BASIC METABOLIC PANEL
Anion gap: 9 (ref 5–15)
BUN: 15 mg/dL (ref 8–23)
CO2: 24 mmol/L (ref 22–32)
Calcium: 8.5 mg/dL — ABNORMAL LOW (ref 8.9–10.3)
Chloride: 103 mmol/L (ref 98–111)
Creatinine, Ser: 0.76 mg/dL (ref 0.61–1.24)
GFR calc Af Amer: >60 mL/min (ref >60)
GFR calc non Af Amer: >60 mL/min (ref >60)
Glucose, Bld: 118 mg/dL — ABNORMAL HIGH (ref 70–99)
Potassium: 3.9 mmol/L (ref 3.5–5.1)
Sodium: 136 mmol/L (ref 135–145)

## 2020-05-16 LAB — CBC
HCT: 22.2 % — ABNORMAL LOW (ref 39.0–52.0)
Hemoglobin: 6.3 g/dL — ABNORMAL LOW (ref 13.0–17.0)
MCH: 19.4 pg — ABNORMAL LOW (ref 26.0–34.0)
MCHC: 28.4 g/dL — ABNORMAL LOW (ref 30.0–36.0)
MCV: 68.5 fL — ABNORMAL LOW (ref 80.0–100.0)
Platelets: 458 10*3/uL — ABNORMAL HIGH (ref 150–400)
RBC: 3.24 MIL/uL — ABNORMAL LOW (ref 4.22–5.81)
RDW: 17.2 % — ABNORMAL HIGH (ref 11.5–15.5)
WBC: 14.7 10*3/uL — ABNORMAL HIGH (ref 4.0–10.5)
nRBC: 0 % (ref 0.0–0.2)

## 2020-05-16 MED ORDER — FUROSEMIDE 10 MG/ML IJ SOLN: 20.0000 mg | Freq: Once | INTRAMUSCULAR | Status: DC

## 2020-05-16 MED ORDER — SODIUM CHLORIDE 0.9% IV SOLUTION
Freq: Once | INTRAVENOUS | Status: DC
Start: 2020-05-16 — End: 2020-05-16

## 2020-05-16 NOTE — Progress Notes (Signed)
  [facility] Perioperative Services: Pre-Admission/Anesthesia Testing  Abnormal Lab Notification  Date: 05/16/20  Name: James Hodge MRN:   [id number]  Re: Abnormal labs noted during PAT appointment  Provider Notified: Robert Bellow, MD Notification mode: Spoke with MD via [facility] secure chat.  Labs of concern:  WBC 14.7  Hgb 6.3, Hct 22.2, MCV 68.5, MCH 19.4, and platelets 458,0000  Disposition: Patient has no previous CBCs available for my review in [facility] or Care Everywhere. After speaking with Dr. Bary Castilla to make him aware of the above, MD advised of plans to have patient come in tomorrow for a pre-operative transfusion.   [name], MSN, APRN, FNP-C, CEN [facility]  Peri-operative Services Nurse Practitioner Phone: [phone] 05/16/20 12:19 PM

## 2020-05-17 ENCOUNTER — Ambulatory Visit
Admission: RE | Admit: 2020-05-17 | Discharge: 2020-05-17 | Disposition: A | Discharge status: Home / Self Care | Payer: Medicare HMO | Source: Ambulatory Visit | Attending: General Surgery | Admitting: General Surgery

## 2020-05-17 LAB — HEMOGLOBIN AND HEMATOCRIT, BLOOD
HCT: 27.5 % — ABNORMAL LOW (ref 39.0–52.0)
Hemoglobin: 8.2 g/dL — ABNORMAL LOW (ref 13.0–17.0)

## 2020-05-17 LAB — PREPARE RBC (CROSSMATCH)

## 2020-05-17 MED ORDER — FUROSEMIDE 10 MG/ML IJ SOLN: 20.0000 mg | Freq: Once | INTRAMUSCULAR | Status: AC

## 2020-05-17 MED ORDER — FUROSEMIDE 10 MG/ML IJ SOLN
INTRAMUSCULAR | Status: AC
  Administered 2020-05-17: 20 mg via INTRAVENOUS
  Filled 2020-05-17: qty 2

## 2020-05-17 MED ORDER — SODIUM CHLORIDE 0.9% IV SOLUTION: Freq: Once | INTRAVENOUS | Status: DC

## 2020-05-17 MED ORDER — FUROSEMIDE 10 MG/ML IJ SOLN
INTRAMUSCULAR | Status: AC
  Filled 2020-05-17: qty 2

## 2020-05-17 MED ORDER — SODIUM CHLORIDE FLUSH 0.9 % IV SOLN
INTRAVENOUS | Status: AC
  Filled 2020-05-17: qty 20

## 2020-05-18 LAB — TYPE AND SCREEN
ABO/RH(D): A NEG
Antibody Screen: NEGATIVE
Unit division: 0
Unit division: 0

## 2020-05-18 LAB — BPAM RBC
Blood Product Expiration Date: 202107072359
Blood Product Expiration Date: 202107072359
ISSUE DATE / TIME: 202106251024
ISSUE DATE / TIME: 202106251228
Unit Type and Rh: 600
Unit Type and Rh: 600

## 2020-05-19 MED ORDER — SODIUM CHLORIDE 0.9 % IV SOLN
1.0000 g | INTRAVENOUS | Status: AC
  Administered 2020-05-20: 1 g via INTRAVENOUS
  Filled 2020-05-19: qty 1

## 2020-05-20 ENCOUNTER — Inpatient Hospital Stay: Payer: Medicare HMO | Admitting: Urgent Care

## 2020-05-20 ENCOUNTER — Inpatient Hospital Stay
Admission: RE | Admit: 2020-05-20 | Discharge: 2020-05-26 | DRG: 330 | Disposition: A | Discharge status: Home / Self Care | Payer: Medicare HMO | Attending: General Surgery | Admitting: General Surgery

## 2020-05-20 ENCOUNTER — Encounter: Payer: Self-pay | Admitting: General Surgery

## 2020-05-20 ENCOUNTER — Other Ambulatory Visit: Payer: Self-pay

## 2020-05-20 ENCOUNTER — Encounter
Admission: RE | Disposition: A | Discharge status: Home / Self Care | Payer: Self-pay | Source: Home / Self Care | Attending: General Surgery

## 2020-05-20 DIAGNOSIS — K567 Ileus, unspecified: Secondary | ICD-10-CM | POA: Diagnosis not present

## 2020-05-20 DIAGNOSIS — Z7951 Long term (current) use of inhaled steroids: Secondary | ICD-10-CM

## 2020-05-20 DIAGNOSIS — J449 Chronic obstructive pulmonary disease, unspecified: Secondary | ICD-10-CM | POA: Diagnosis present

## 2020-05-20 DIAGNOSIS — Z79899 Other long term (current) drug therapy: Secondary | ICD-10-CM

## 2020-05-20 DIAGNOSIS — Z20822 Contact with and (suspected) exposure to covid-19: Secondary | ICD-10-CM | POA: Diagnosis present

## 2020-05-20 DIAGNOSIS — Z87891 Personal history of nicotine dependence: Secondary | ICD-10-CM | POA: Diagnosis not present

## 2020-05-20 DIAGNOSIS — C182 Malignant neoplasm of ascending colon: Secondary | ICD-10-CM | POA: Diagnosis present

## 2020-05-20 DIAGNOSIS — I10 Essential (primary) hypertension: Secondary | ICD-10-CM | POA: Diagnosis present

## 2020-05-20 DIAGNOSIS — D509 Iron deficiency anemia, unspecified: Secondary | ICD-10-CM | POA: Diagnosis present

## 2020-05-20 HISTORY — PX: LAPAROSCOPIC PARTIAL COLECTOMY: SHX5907

## 2020-05-20 LAB — CBC
HCT: 30.6 % — ABNORMAL LOW (ref 39.0–52.0)
Hemoglobin: 9.2 g/dL — ABNORMAL LOW (ref 13.0–17.0)
MCH: 22.3 pg — ABNORMAL LOW (ref 26.0–34.0)
MCHC: 30.1 g/dL (ref 30.0–36.0)
MCV: 74.1 fL — ABNORMAL LOW (ref 80.0–100.0)
Platelets: 463 10*3/uL — ABNORMAL HIGH (ref 150–400)
RBC: 4.13 MIL/uL — ABNORMAL LOW (ref 4.22–5.81)
RDW: 21.6 % — ABNORMAL HIGH (ref 11.5–15.5)
WBC: 14.5 10*3/uL — ABNORMAL HIGH (ref 4.0–10.5)
nRBC: 0 % (ref 0.0–0.2)

## 2020-05-20 LAB — PREPARE RBC (CROSSMATCH)

## 2020-05-20 SURGERY — LAPAROSCOPIC PARTIAL COLECTOMY
Anesthesia: General | Laterality: Right

## 2020-05-20 MED ORDER — DEXTROSE IN LACTATED RINGERS 5 % IV SOLN: INTRAVENOUS | Status: DC

## 2020-05-20 MED ORDER — BUPIVACAINE-EPINEPHRINE (PF) 0.5% -1:200000 IJ SOLN
INTRAMUSCULAR | Status: DC | PRN
  Administered 2020-05-20: 30 mL

## 2020-05-20 MED ORDER — FAMOTIDINE 20 MG PO TABS
ORAL_TABLET | ORAL | Status: AC
  Administered 2020-05-20: 20 mg via ORAL
  Filled 2020-05-20: qty 1

## 2020-05-20 MED ORDER — ACETAMINOPHEN 10 MG/ML IV SOLN
INTRAVENOUS | Status: DC | PRN
  Administered 2020-05-20: 1000 mg via INTRAVENOUS

## 2020-05-20 MED ORDER — PROPOFOL 10 MG/ML IV BOLUS
INTRAVENOUS | Status: DC | PRN
  Administered 2020-05-20: 130 mg via INTRAVENOUS

## 2020-05-20 MED ORDER — SEVOFLURANE IN SOLN
RESPIRATORY_TRACT | Status: AC
  Filled 2020-05-20: qty 250

## 2020-05-20 MED ORDER — SUGAMMADEX SODIUM 200 MG/2ML IV SOLN
INTRAVENOUS | Status: DC | PRN
  Administered 2020-05-20: 100 mg via INTRAVENOUS

## 2020-05-20 MED ORDER — IBUPROFEN 400 MG PO TABS: 400.0000 mg | ORAL_TABLET | ORAL | Status: DC | PRN

## 2020-05-20 MED ORDER — FENTANYL CITRATE (PF) 100 MCG/2ML IJ SOLN
INTRAMUSCULAR | Status: AC
  Filled 2020-05-20: qty 2

## 2020-05-20 MED ORDER — ONDANSETRON HCL 4 MG/2ML IJ SOLN
INTRAMUSCULAR | Status: AC
  Filled 2020-05-20: qty 2

## 2020-05-20 MED ORDER — ACETAMINOPHEN 10 MG/ML IV SOLN
INTRAVENOUS | Status: AC
  Filled 2020-05-20: qty 100

## 2020-05-20 MED ORDER — ROCURONIUM BROMIDE 10 MG/ML (PF) SYRINGE
PREFILLED_SYRINGE | INTRAVENOUS | Status: AC
  Filled 2020-05-20: qty 10

## 2020-05-20 MED ORDER — CHLORHEXIDINE GLUCONATE 0.12 % MT SOLN: 15.0000 mL | Freq: Once | OROMUCOSAL | Status: AC

## 2020-05-20 MED ORDER — BUPIVACAINE-EPINEPHRINE (PF) 0.5% -1:200000 IJ SOLN
INTRAMUSCULAR | Status: AC
  Filled 2020-05-20: qty 30

## 2020-05-20 MED ORDER — DEXMEDETOMIDINE HCL IN NACL 200 MCG/50ML IV SOLN
INTRAVENOUS | Status: DC | PRN
Start: 2020-05-20 — End: 2020-05-20
  Administered 2020-05-20 (×3): 4 ug via INTRAVENOUS

## 2020-05-20 MED ORDER — MORPHINE SULFATE (PF) 2 MG/ML IV SOLN
2.0000 mg | INTRAVENOUS | Status: DC | PRN
  Administered 2020-05-20: 2 mg via INTRAVENOUS
  Filled 2020-05-20: qty 1

## 2020-05-20 MED ORDER — MIDAZOLAM HCL 2 MG/2ML IJ SOLN
INTRAMUSCULAR | Status: DC | PRN
  Administered 2020-05-20: 2 mg via INTRAVENOUS

## 2020-05-20 MED ORDER — LISINOPRIL 10 MG PO TABS
10.0000 mg | ORAL_TABLET | Freq: Every day | ORAL | Status: DC
  Administered 2020-05-20 – 2020-05-23 (×4): 10 mg via ORAL
  Filled 2020-05-20 (×7): qty 1

## 2020-05-20 MED ORDER — FAMOTIDINE 20 MG PO TABS: 20.0000 mg | ORAL_TABLET | Freq: Once | ORAL | Status: AC

## 2020-05-20 MED ORDER — LIDOCAINE HCL (PF) 2 % IJ SOLN
INTRAMUSCULAR | Status: AC
  Filled 2020-05-20: qty 5

## 2020-05-20 MED ORDER — ONDANSETRON HCL 4 MG/2ML IJ SOLN
INTRAMUSCULAR | Status: DC | PRN
  Administered 2020-05-20: 4 mg via INTRAVENOUS

## 2020-05-20 MED ORDER — ORAL CARE MOUTH RINSE: 15.0000 mL | Freq: Once | OROMUCOSAL | Status: AC

## 2020-05-20 MED ORDER — OXYCODONE HCL 5 MG PO TABS
5.0000 mg | ORAL_TABLET | ORAL | Status: DC | PRN
  Administered 2020-05-20 – 2020-05-22 (×5): 5 mg via ORAL
  Filled 2020-05-20 (×5): qty 1

## 2020-05-20 MED ORDER — MIDAZOLAM HCL 2 MG/2ML IJ SOLN
INTRAMUSCULAR | Status: AC
  Filled 2020-05-20: qty 2

## 2020-05-20 MED ORDER — ROCURONIUM BROMIDE 100 MG/10ML IV SOLN
INTRAVENOUS | Status: DC | PRN
  Administered 2020-05-20: 50 mg via INTRAVENOUS
  Administered 2020-05-20: 20 mg via INTRAVENOUS
  Administered 2020-05-20: 30 mg via INTRAVENOUS

## 2020-05-20 MED ORDER — LACTATED RINGERS IV SOLN: INTRAVENOUS | Status: DC

## 2020-05-20 MED ORDER — TIOTROPIUM BROMIDE MONOHYDRATE 18 MCG IN CAPS
18.0000 ug | ORAL_CAPSULE | Freq: Every day | RESPIRATORY_TRACT | Status: DC
  Administered 2020-05-23 – 2020-05-25 (×3): 18 ug via RESPIRATORY_TRACT
  Filled 2020-05-20 (×2): qty 5

## 2020-05-20 MED ORDER — ONDANSETRON HCL 4 MG/2ML IJ SOLN
4.0000 mg | INTRAMUSCULAR | Status: DC | PRN
  Administered 2020-05-22 – 2020-05-25 (×2): 4 mg via INTRAVENOUS
  Filled 2020-05-20 (×2): qty 2

## 2020-05-20 MED ORDER — PROPOFOL 10 MG/ML IV BOLUS
INTRAVENOUS | Status: AC
  Filled 2020-05-20: qty 40

## 2020-05-20 MED ORDER — ENOXAPARIN SODIUM 40 MG/0.4ML ~~LOC~~ SOLN
40.0000 mg | SUBCUTANEOUS | Status: DC
  Administered 2020-05-21 – 2020-05-26 (×6): 40 mg via SUBCUTANEOUS
  Filled 2020-05-20 (×6): qty 0.4

## 2020-05-20 MED ORDER — FENTANYL CITRATE (PF) 100 MCG/2ML IJ SOLN
INTRAMUSCULAR | Status: DC | PRN
  Administered 2020-05-20 (×2): 50 ug via INTRAVENOUS

## 2020-05-20 MED ORDER — CHLORHEXIDINE GLUCONATE 0.12 % MT SOLN
OROMUCOSAL | Status: AC
  Administered 2020-05-20: 15 mL via OROMUCOSAL
  Filled 2020-05-20: qty 15

## 2020-05-20 MED ORDER — ACETAMINOPHEN 10 MG/ML IV SOLN
1000.0000 mg | Freq: Four times a day (QID) | INTRAVENOUS | Status: AC
  Administered 2020-05-20 – 2020-05-21 (×4): 1000 mg via INTRAVENOUS
  Filled 2020-05-20 (×5): qty 100

## 2020-05-20 MED ORDER — ALVIMOPAN 12 MG PO CAPS
12.0000 mg | ORAL_CAPSULE | Freq: Two times a day (BID) | ORAL | Status: DC
  Administered 2020-05-20 – 2020-05-22 (×5): 12 mg via ORAL
  Filled 2020-05-20 (×7): qty 1

## 2020-05-20 MED ORDER — PROMETHAZINE HCL 25 MG/ML IJ SOLN: 6.2500 mg | INTRAMUSCULAR | Status: DC | PRN

## 2020-05-20 MED ORDER — DEXAMETHASONE SODIUM PHOSPHATE 10 MG/ML IJ SOLN
INTRAMUSCULAR | Status: AC
  Filled 2020-05-20: qty 1

## 2020-05-20 MED ORDER — FLUTICASONE FUROATE-VILANTEROL 100-25 MCG/INH IN AEPB
1.0000 | INHALATION_SPRAY | Freq: Every day | RESPIRATORY_TRACT | Status: DC
  Administered 2020-05-23 – 2020-05-25 (×3): 1 via RESPIRATORY_TRACT
  Filled 2020-05-20 (×2): qty 28

## 2020-05-20 SURGICAL SUPPLY — 73 items
APPLIER CLIP ROT 10 11.4 M/L (STAPLE)
BLADE SURG 10 STRL SS SAFETY (BLADE) ×3 IMPLANT
BLADE SURG 11 STRL SS SAFETY (MISCELLANEOUS) ×3 IMPLANT
CANISTER SUCT 1200ML W/VALVE (MISCELLANEOUS) ×3 IMPLANT
CANNULA DILATOR 10 W/SLV (CANNULA) ×2 IMPLANT
CANNULA DILATOR 10MM W/SLV (CANNULA) ×1
CHLORAPREP W/TINT 26 (MISCELLANEOUS) ×3 IMPLANT
CLIP APPLIE ROT 10 11.4 M/L (STAPLE) IMPLANT
CLOSURE WOUND 1/2 X4 (GAUZE/BANDAGES/DRESSINGS) ×1
COVER CLAMP SIL LG PBX B (MISCELLANEOUS) IMPLANT
COVER LIGHT HANDLE STERIS (MISCELLANEOUS) ×3 IMPLANT
COVER WAND RF STERILE (DRAPES) ×3 IMPLANT
DRAPE LAP W/FLUID (DRAPES) IMPLANT
DRAPE UNDER BUTTOCK W/FLU (DRAPES) IMPLANT
DRSG OPSITE POSTOP 4X10 (GAUZE/BANDAGES/DRESSINGS) IMPLANT
DRSG OPSITE POSTOP 4X8 (GAUZE/BANDAGES/DRESSINGS) ×3 IMPLANT
DRSG TEGADERM 2-3/8X2-3/4 SM (GAUZE/BANDAGES/DRESSINGS) ×6 IMPLANT
DRSG TEGADERM 4X4.75 (GAUZE/BANDAGES/DRESSINGS) IMPLANT
DRSG TELFA 3X8 NADH (GAUZE/BANDAGES/DRESSINGS) ×3 IMPLANT
ELECT BLADE 6.5 EXT (BLADE) ×3 IMPLANT
ELECT CAUTERY BLADE 6.4 (BLADE) ×3 IMPLANT
ELECT REM PT RETURN 9FT ADLT (ELECTROSURGICAL) ×3
ELECTRODE REM PT RTRN 9FT ADLT (ELECTROSURGICAL) ×1 IMPLANT
GLOVE BIO SURGEON STRL SZ7.5 (GLOVE) ×18 IMPLANT
GLOVE INDICATOR 8.0 STRL GRN (GLOVE) ×18 IMPLANT
GOWN STRL REUS W/ TWL LRG LVL3 (GOWN DISPOSABLE) ×6 IMPLANT
GOWN STRL REUS W/TWL LRG LVL3 (GOWN DISPOSABLE) ×12
HANDLE YANKAUER SUCT BULB TIP (MISCELLANEOUS) ×3 IMPLANT
HOLDER FOLEY CATH W/STRAP (MISCELLANEOUS) IMPLANT
IRRIGATION STRYKERFLOW (MISCELLANEOUS) IMPLANT
IRRIGATOR STRYKERFLOW (MISCELLANEOUS)
IV LACTATED RINGERS 1000ML (IV SOLUTION) IMPLANT
KIT PINK PAD W/HEAD ARE REST (MISCELLANEOUS) ×3
KIT PINK PAD W/HEAD ARM REST (MISCELLANEOUS) ×1 IMPLANT
KIT TURNOVER KIT A (KITS) ×3 IMPLANT
LABEL OR SOLS (LABEL) ×3 IMPLANT
LIGASURE LAP MARYLAND 5MM 37CM (ELECTROSURGICAL) ×3 IMPLANT
NDL INSUFF ACCESS 14 VERSASTEP (NEEDLE) ×3 IMPLANT
NEEDLE HYPO 22GX1.5 SAFETY (NEEDLE) ×3 IMPLANT
NS IRRIG 500ML POUR BTL (IV SOLUTION) ×6 IMPLANT
PACK COLON CLEAN CLOSURE (MISCELLANEOUS) ×3 IMPLANT
PACK LAP CHOLECYSTECTOMY (MISCELLANEOUS) ×3 IMPLANT
PAD PREP 24X41 OB/GYN DISP (PERSONAL CARE ITEMS) ×3 IMPLANT
PENCIL ELECTRO HAND CTR (MISCELLANEOUS) ×3 IMPLANT
RELOAD PROXIMATE 75MM BLUE (ENDOMECHANICALS) ×9 IMPLANT
RETRACTOR WOUND ALXS 18CM MED (MISCELLANEOUS) ×1 IMPLANT
RTRCTR WOUND ALEXIS O 18CM MED (MISCELLANEOUS) ×3
SCISSORS METZENBAUM CVD 33 (INSTRUMENTS) ×3 IMPLANT
SET YANKAUER POOLE SUCT (MISCELLANEOUS) ×3 IMPLANT
SLEEVE ENDOPATH XCEL 5M (ENDOMECHANICALS) ×3 IMPLANT
SPONGE LAP 18X18 RF (DISPOSABLE) ×9 IMPLANT
STAPLER GUN LINEAR PROX 60 (STAPLE) ×3 IMPLANT
STAPLER PROXIMATE 75MM BLUE (STAPLE) ×3 IMPLANT
STRIP CLOSURE SKIN 1/2X4 (GAUZE/BANDAGES/DRESSINGS) ×2 IMPLANT
SUT MAXON ABS #0 GS21 30IN (SUTURE) ×9 IMPLANT
SUT SILK 2 0 (SUTURE) ×2
SUT SILK 2-0 30XBRD TIE 12 (SUTURE) ×1 IMPLANT
SUT SILK 3-0 (SUTURE) ×6 IMPLANT
SUT VIC AB 0 CT1 36 (SUTURE) ×3 IMPLANT
SUT VIC AB 2-0 BRD 54 (SUTURE) ×3 IMPLANT
SUT VIC AB 2-0 CT1 27 (SUTURE) ×2
SUT VIC AB 2-0 CT1 TAPERPNT 27 (SUTURE) ×1 IMPLANT
SUT VIC AB 3-0 54X BRD REEL (SUTURE) ×1 IMPLANT
SUT VIC AB 3-0 BRD 54 (SUTURE) ×2
SUT VIC AB 3-0 SH 27 (SUTURE) ×4
SUT VIC AB 3-0 SH 27X BRD (SUTURE) ×2 IMPLANT
SUT VIC AB 4-0 FS2 27 (SUTURE) ×3 IMPLANT
SWABSTK COMLB BENZOIN TINCTURE (MISCELLANEOUS) ×3 IMPLANT
SYR BULB IRRIG 60ML STRL (SYRINGE) ×3 IMPLANT
TRAY FOLEY MTR SLVR 16FR STAT (SET/KITS/TRAYS/PACK) IMPLANT
TROCAR XCEL NON-BLD 11X100MML (ENDOMECHANICALS) ×3 IMPLANT
TROCAR XCEL NON-BLD 5MMX100MML (ENDOMECHANICALS) ×3 IMPLANT
TUBING EVAC SMOKE HEATED PNEUM (TUBING) ×3 IMPLANT

## 2020-05-20 NOTE — Anesthesia Preprocedure Evaluation (Addendum)
Anesthesia Evaluation  Patient identified by MRN, date of birth, ID band Patient awake    Reviewed: Allergy & Precautions, H&P , NPO status , Patient's Chart, lab work & pertinent test results, reviewed documented beta blocker date and time   History of Anesthesia Complications Negative for: history of anesthetic complications  Airway Mallampati: II  TM Distance: >3 FB Neck ROM: full    Dental  (+) Edentulous Lower, Edentulous Upper   Pulmonary shortness of breath, neg sleep apnea, COPD,  COPD inhaler, Patient abstained from smoking.Not current smoker, former smoker,    Pulmonary exam normal breath sounds clear to auscultation       Cardiovascular Exercise Tolerance: Good METShypertension, (-) angina(-) CAD and (-) Past MI Normal cardiovascular exam(-) dysrhythmias (-) Valvular Problems/Murmurs Rhythm:regular Rate:Normal     Neuro/Psych negative neurological ROS  negative psych ROS   GI/Hepatic negative GI ROS, Neg liver ROS, neg GERD  ,  Endo/Other  negative endocrine ROSneg diabetes  Renal/GU negative Renal ROS  negative genitourinary   Musculoskeletal   Abdominal   Peds  Hematology  (+) Blood dyscrasia (Hgb was 6, now up to 8 s/p transfusion), anemia ,   Anesthesia Other Findings Past Medical History: No date: COPD (chronic obstructive pulmonary disease) ([facility]) Past Surgical History: No date: CHOLECYSTECTOMY     Comment:  Around 2001 No date: EYE SURGERY BMI    Body Mass Index: 27.76 kg/m     Patient got transfused on Friday for a hgb of 6, now up to 8.  Will have 2 units available for the OR.  Reproductive/Obstetrics negative OB ROS                            Anesthesia Physical  Anesthesia Plan  ASA: II  Anesthesia Plan: General   Post-op Pain Management:    Induction: Intravenous  PONV Risk Score and Plan: 2 and Ondansetron, Dexamethasone and Treatment may vary due to  age or medical condition  Airway Management Planned: Oral ETT  Additional Equipment: None  Intra-op Plan:   Post-operative Plan: Extubation in OR  Informed Consent: I have reviewed the patients History and Physical, chart, labs and discussed the procedure including the risks, benefits and alternatives for the proposed anesthesia with the patient or authorized representative who has indicated his/her understanding and acceptance.     Dental Advisory Given  Plan Discussed with: CRNA  Anesthesia Plan Comments: (Discussed risks of anesthesia with patient, including possibility of difficulty with spontaneous ventilation under anesthesia necessitating airway intervention, PONV, and rare risks such as cardiac or respiratory or neurological events. Patient understands.)        Anesthesia Quick Evaluation

## 2020-05-20 NOTE — Anesthesia Procedure Notes (Signed)
Procedure Name: Intubation Date/Time: 05/20/2020 8:47 AM Performed by: Gentry Fitz, CRNA Pre-anesthesia Checklist: Patient identified, Emergency Drugs available, Suction available and Patient being monitored Patient Re-evaluated:Patient Re-evaluated prior to induction Oxygen Delivery Method: Circle system utilized Preoxygenation: Pre-oxygenation with 100% oxygen Induction Type: IV induction Ventilation: Mask ventilation without difficulty Laryngoscope Size: McGraph and 3 Grade View: Grade I Tube type: Oral Tube size: 7.5 mm Number of attempts: 2 Placement Confirmation: ETT inserted through vocal cords under direct vision,  positive ETCO2 and breath sounds checked- equal and bilateral Secured at: 22 cm Tube secured with: Tape Dental Injury: Teeth and Oropharynx as per pre-operative assessment

## 2020-05-20 NOTE — Anesthesia Postprocedure Evaluation (Signed)
Anesthesia Post Note  Patient: LEAMON PALAU  Procedure(s) Performed: LAPAROSCOPIC PARTIAL COLECTOMY (Right )  Patient location during evaluation: PACU Anesthesia Type: General Level of consciousness: awake and alert Pain management: pain level controlled Vital Signs Assessment: post-procedure vital signs reviewed and stable Respiratory status: spontaneous breathing, nonlabored ventilation, respiratory function stable and patient connected to nasal cannula oxygen Cardiovascular status: blood pressure returned to baseline and stable Postop Assessment: no apparent nausea or vomiting Anesthetic complications: no   No complications documented.   Last Vitals:  Vitals:   05/20/20 1340 05/20/20 1420  BP: 133/81 139/78  Pulse: 83 87  Resp: 14 17  Temp: 36.7 C 36.9 C  SpO2: 97% 99%    Last Pain:  Vitals:   05/20/20 1420  TempSrc: Oral  PainSc:                  Martha Clan

## 2020-05-20 NOTE — Progress Notes (Signed)
The patient has walked with nurse using the walker 25 ft as that was the only amount he was able to tolerate.

## 2020-05-20 NOTE — H&P (Signed)
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  Tolerated TX of 2 U PRBC 3 days ago well.  Less SOB since then.  Tolerated prep as good as could be expected.   Medications Prior to Admission  Medication Sig Dispense Refill Last Dose  . BREO ELLIPTA 100-25 MCG/INH AEPB Inhale 1 puff into the lungs at bedtime.   05/19/2020 at Unknown time  . lisinopril (ZESTRIL) 10 MG tablet Take 10 mg by mouth daily.   05/19/2020 at Unknown time  . tiotropium (SPIRIVA) 18 MCG inhalation capsule Place 18 mcg into inhaler and inhale at bedtime.    05/19/2020 at Unknown time   No Known Allergies Past Medical History:  Diagnosis Date  . Cancer ([place])   . COPD (chronic obstructive pulmonary disease) ([place])   . Hypertension    Past Surgical History:  Procedure Laterality Date  . CHOLECYSTECTOMY     Around 2001  . COLONOSCOPY WITH PROPOFOL N/A 04/12/2020   Procedure: COLONOSCOPY WITH PROPOFOL;  Surgeon: Robert Bellow, MD;  Location: [facility] ENDOSCOPY;  Service: Endoscopy;  Laterality: N/A;  . COLONOSCOPY WITH PROPOFOL N/A 04/24/2020   Procedure: COLONOSCOPY WITH PROPOFOL;  Surgeon: Robert Bellow, MD;  Location: [facility] ENDOSCOPY;  Service: Endoscopy;  Laterality: N/A;  . ESOPHAGOGASTRODUODENOSCOPY (EGD) WITH PROPOFOL N/A 04/12/2020   Procedure: ESOPHAGOGASTRODUODENOSCOPY (EGD) WITH PROPOFOL;  Surgeon: Robert Bellow, MD;  Location: [facility] ENDOSCOPY;  Service: Endoscopy;  Laterality: N/A;  . EYE SURGERY Bilateral    cataract   Social History   Socioeconomic History  . Marital status: Married    Spouse name: Not on file  . Number of children: Not on file  . Years of education: Not on file  . Highest education level: Not on file  Occupational History  . Not on file  Tobacco Use  . Smoking status: Former Smoker    Years: 3.00    Types: Cigarettes    Quit date: 11/23/1968    Years since quitting: 51.5  . Smokeless tobacco: Never Used  Vaping Use   . Vaping Use: Never used  Substance and Sexual Activity  . Alcohol use: Not Currently  . Drug use: Never  . Sexual activity: Not on file  Other Topics Concern  . Not on file  Social History Narrative  . Not on file   Social Determinants of Health   Financial Resource Strain:   . Difficulty of Paying Living Expenses:   Food Insecurity:   . Worried About Charity fundraiser in the Last Year:   . Arboriculturist in the Last Year:   Transportation Needs:   . Film/video editor (Medical):   Marland Kitchen Lack of Transportation (Non-Medical):   Physical Activity:   . Days of Exercise per Week:   . Minutes of Exercise per Session:   Stress:   . Feeling of Stress :   Social Connections:   . Frequency of Communication with Friends and Family:   . Frequency of Social Gatherings with Friends and Family:   . Attends Religious Services:   . Active Member of Clubs or Organizations:   . Attends Archivist Meetings:   Marland Kitchen Marital Status:   Intimate Partner Violence:   . Fear of Current or Ex-Partner:   . Emotionally Abused:   Marland Kitchen Physically Abused:   . Sexually Abused:    Social History   Social History Narrative  . Not on  file     ROS: Negative.     PE: HEENT: Negative. Lungs: Clear. Cardio: RR.  Assessment/Plan:  Proceed with planned right colectomy.    Forest Gleason [facility] 05/20/2020

## 2020-05-20 NOTE — Transfer of Care (Signed)
Immediate Anesthesia Transfer of Care Note  Patient: James Hodge  Procedure(s) Performed: LAPAROSCOPIC PARTIAL COLECTOMY (Right )  Patient Location: PACU  Anesthesia Type:General  Level of Consciousness: drowsy  Airway & Oxygen Therapy: Patient Spontanous Breathing and Patient connected to face mask oxygen  Post-op Assessment: Report given to RN and Post -op Vital signs reviewed and stable  Post vital signs: Reviewed and stable  Last Vitals:  Vitals Value Taken Time  BP 123/82 05/20/20 1221  Temp 36.6 C 05/20/20 1221  Pulse 83 05/20/20 1223  Resp 17 05/20/20 1223  SpO2 100 % 05/20/20 1223  Vitals shown include unvalidated device data.  Last Pain:  Vitals:   05/20/20 1221  TempSrc:   PainSc: 0-No pain         Complications: No complications documented.

## 2020-05-20 NOTE — Op Note (Signed)
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  Postoperative diagnosis: Same with involvement of the adjacent small bowel.  Operative procedure: Laparoscopically assisted right hemicolectomy with associated small bowel resection, ileotransverse colostomy.  Operating surgeon: Hervey Ard, MD.  Assistant: Elsie Stain, RNFA.  Anesthesia: General endotracheal.  Marcaine 0.5% with 1: 200,000 units of epinephrine, 30 cc.  Estimated blood loss: 200 cc.  Fluids: Crystalloid, 800 cc  Clinical note: This 74 year old male was noted to have anemia and subsequent colonoscopy showed a partially obstructing mass in the ascending colon.  Preoperative CT showed enlarged mesenteric lymph nodes.  No evidence of hepatic disease.  Normal preoperative CEA.  He is felt to be a candidate for resection.  He underwent formal bowel prep.  He received Invanz prior to the procedure.  SCD stockings for DVT prevention.  Operative note: The abdomen was cleansed with ChloraPrep and draped.  In Trendelenburg position a varies needle was placed through a transumbilical incision.  After assuring intra-abdominal location with a hanging drop test the abdomen was insufflated with CO2 at 10 mmHg pressure.  A 10 mm Neck step port was expanded.  Inspection showed no evidence of injury from initial port placement.  There were was a band of adhesions to the right anterior abdominal wall, possibly related to his distant cholecystectomy.  The were extensive adhesions in the right upper quadrant from the same cholecystectomy to the omentum and hepatic flexure.  A 5 mm port was placed in the epigastrium and as well as into the left upper quadrant under direct vision.  The patient was rolled to the left and attention turned towards freeing the omentum from the transverse colon.  A LigaSure device was used for this.  Adhesions of the colon to the undersurface of the liver were divided in a similar fashion.  The hepatic flexure  was swept inferiorly and the white line of Toldt divided.  There was a large baseball mass size tumor that encompassed at least 1 loop of small bowel evident.  The 5 mm left upper quadrant port was swapped for an 11 mm XL port to allow further mobilization.  The inferior aspect of the tumor mass was freed and rolled as far as possible to allow full mobilization.  There was no evidence of liver involvement but visualization was impaired by adhesions.  At this time the abdomen was desufflated and a 9 cm incision was made beginning at the infraumbilical port site cephalad.  This was done after local anesthesia was infiltrated.  The skin was incised sharply and the remaining dissection completed with electrocautery.  The midline fascia was divided with cutting current.  The sigmoid colon was underneath the incision and the cautery touch this.  This area was reinforced with a 3-0 Vicryl figure-of-eight suture.  No evidence of muscular involvement.  An [place] wound protector was placed.  There were found to be additional posterior adhesions from the tumor mass and these were mobilized making use of the harmonic scalpel.  Once the entire mass was eviscerated it was found that there was a loop of distal small bowel adherent to the main tumor mass.  It was elected to transect this and take it with the specimen.  The proximal bowel was divided about mid transverse colon level with a GIA stapler.  The ileal area was divided in a similar fashion.  The harmonic scalpel was used to skeletonize the mesentery.  Vessels were carefully coagulated.  The right colon vessel was controlled with 2-0 silk ties  x2.  Once the specimen was removed better visualization was obtained.  The right upper quadrant was irrigated with no evidence of active bleeding.  Wet lap was placed in this area while the anastomosis was completed.  It was elected to resect an additional 10 cm of small bowel as the mesentery had tethered into the tumor mass.   This was resected and sent a separate specimen with a GIA stapler.  A side-to-side functional end-to-end anastomosis was made.  The ileum was sewn to the tinea of the transverse colon with interrupted 3-0 silk sutures.  GIA stapler was placed and intact suture line obtained with good hemostasis.  The colotomy was closed with a TA 60 stapler with 3.5 mm staples.  The edges were reinforced with 3-0 silk figure-of-eight sutures to reinforce the crotch as well as the ends of the suture line.  The mesentery was closed with a running 3-0 Vicryl suture.  The abdomen was irrigated and good hemostasis again noted.  Gown and gloves were changed as well as instruments.  Betadine was placed onto the skin and the PIP posterior rectus sheath closed with a running 2-0 Vicryl suture.  The fascia was closed with interrupted 0 Maxon figure-of-eight sutures.  The adipose layer was approximated with 2-0 Vicryl suture and the skin closed with a running 4-0 Vicryl subcuticular suture.  Port sites were closed with 4-0 Vicryl subcuticular sutures.  Honeycomb dressing was placed as well as Telfa and Tegaderm to the port sites.  The patient tolerated the procedure well and was taken recovery in stable condition.

## 2020-05-21 ENCOUNTER — Encounter: Payer: Self-pay | Admitting: General Surgery

## 2020-05-21 LAB — TYPE AND SCREEN
ABO/RH(D): A NEG
Antibody Screen: NEGATIVE
Unit division: 0
Unit division: 0

## 2020-05-21 LAB — BPAM RBC
Blood Product Expiration Date: 202107142359
Blood Product Expiration Date: 202107152359
Unit Type and Rh: 600
Unit Type and Rh: 600

## 2020-05-21 NOTE — Progress Notes (Signed)
Initial Nutrition Assessment  DOCUMENTATION CODES:   Not applicable  INTERVENTION:   Recommend Ensure Enlive po TID with diet advancement, each supplement provides 350 kcal and 20 grams of protein  Recommend MVI daily   NUTRITION DIAGNOSIS:   Increased nutrient needs related to chronic illness (COPD) and post-op healing as evidenced by increased estimated needs.  GOAL:   Patient will meet greater than or equal to 90% of their needs  MONITOR:   PO intake, Labs, Weight trends, Skin, I & O's, Diet advancement  REASON FOR ASSESSMENT:   Malnutrition Screening Tool    ASSESSMENT:   74 y/o male with h/o COPD and HTN who is admitted with colon mass now s/p laparoscopically assisted right hemicolectomy with associated small bowel resection & ileotransverse colostomy 6/28   Met with pt in room today. Pt reports good appetite and oral intake at baseline. Pt reports that he did eat some of his clear liquid tray this morning. Pt reports weight loss pta. Per chart, pt has lost 45lbs(18%) over the past year and 10lbs(4%) over the past two months. RD discussed with patient the importance of adequate nutrition needed for post op healing and to preserve lean muscle; pt verbalizes understanding. Would recommend supplements and MVI to help pt meet his estimated needs.    Medications reviewed and include: lovenox, LRS w/ 5% dextrose '@50ml' /hr  Labs reviewed: wbc- 14.5(H), Hgb 9.2(L), Hct 30.6(L), MCV 74.1(L), MCH 22.3(L)  NUTRITION - FOCUSED PHYSICAL EXAM:    Most Recent Value  Orbital Region No depletion  Upper Arm Region Mild depletion  Thoracic and Lumbar Region Mild depletion  Buccal Region No depletion  Temple Region No depletion  Clavicle Bone Region Mild depletion  Clavicle and Acromion Bone Region Mild depletion  Scapular Bone Region Mild depletion  Dorsal Hand Mild depletion  Patellar Region Mild depletion  Anterior Thigh Region Mild depletion  Posterior Calf Region Mild  depletion  Edema (RD Assessment) None  Hair Reviewed  Eyes Reviewed  Mouth Reviewed  Skin Reviewed  Nails Reviewed     Diet Order:   Diet Order            Diet clear liquid Room service appropriate? Yes; Fluid consistency: Thin  Diet effective now                EDUCATION NEEDS:   Education needs have been addressed  Skin:  Skin Assessment: Reviewed RN Assessment (incision abdomen)  Last BM:  6/27  Height:   Ht Readings from Last 1 Encounters:  05/20/20 '6\' 3"'  (1.905 m)    Weight:   Wt Readings from Last 1 Encounters:  05/21/20 95.1 kg    Ideal Body Weight:  89 kg  BMI:  Body mass index is 26.2 kg/m.  Estimated Nutritional Needs:   Kcal:  2300-2700kcal/day  Protein:  115-135g/day  Fluid:  >2.3L/day  Koleen Distance MS, RD, LDN Please refer to [facility] for RD and/or RD on-call/weekend/after hours pager

## 2020-05-21 NOTE — Progress Notes (Signed)
Pt tolerated ambulation with stand-by assist to the bathroom and back to bed, approximately 25 feet. No issues noted.

## 2020-05-22 NOTE — Progress Notes (Signed)
Ambulated pt with front wheel walker and stand-by assist in the hallway this evening, approximately 125 ft. Tolerated well.

## 2020-05-22 NOTE — Progress Notes (Signed)
AVSS.  Mild nausea yesterday, none today.  No flatus/ BM. Modest distension. Lungs: Clear. Inspirex at 1000. Cardio: RR. ABD: Mod distension,mild tenderness. Dressing: Scant old blood. Extrem: soft. Voiding OK. U/O OK. IMP: Mild ileus. Plan: Continue Entereg (using po oxycodone only last 24 hours). Hold diet advance. Check labs in AM.

## 2020-05-23 ENCOUNTER — Inpatient Hospital Stay: Payer: Medicare HMO

## 2020-05-23 LAB — BASIC METABOLIC PANEL
Anion gap: 6 (ref 5–15)
BUN: 10 mg/dL (ref 8–23)
CO2: 28 mmol/L (ref 22–32)
Calcium: 8.3 mg/dL — ABNORMAL LOW (ref 8.9–10.3)
Chloride: 101 mmol/L (ref 98–111)
Creatinine, Ser: 0.78 mg/dL (ref 0.61–1.24)
GFR calc Af Amer: >60 mL/min (ref >60)
GFR calc non Af Amer: >60 mL/min (ref >60)
Glucose, Bld: 114 mg/dL — ABNORMAL HIGH (ref 70–99)
Potassium: 4 mmol/L (ref 3.5–5.1)
Sodium: 135 mmol/L (ref 135–145)

## 2020-05-23 LAB — CBC WITH DIFFERENTIAL/PLATELET
Abs Immature Granulocytes: 0.07 10*3/uL (ref 0.00–0.07)
Basophils Absolute: 0 10*3/uL (ref 0.0–0.1)
Basophils Relative: 0 %
Eosinophils Absolute: 0.2 10*3/uL (ref 0.0–0.5)
Eosinophils Relative: 1 %
HCT: 26.7 % — ABNORMAL LOW (ref 39.0–52.0)
Hemoglobin: 7.8 g/dL — ABNORMAL LOW (ref 13.0–17.0)
Immature Granulocytes: 0 %
Lymphocytes Relative: 22 %
Lymphs Abs: 3.5 10*3/uL (ref 0.7–4.0)
MCH: 22.3 pg — ABNORMAL LOW (ref 26.0–34.0)
MCHC: 29.2 g/dL — ABNORMAL LOW (ref 30.0–36.0)
MCV: 76.5 fL — ABNORMAL LOW (ref 80.0–100.0)
Monocytes Absolute: 2 10*3/uL — ABNORMAL HIGH (ref 0.1–1.0)
Monocytes Relative: 12 %
Neutro Abs: 10.5 10*3/uL — ABNORMAL HIGH (ref 1.7–7.7)
Neutrophils Relative %: 65 %
Platelets: 396 10*3/uL (ref 150–400)
RBC: 3.49 MIL/uL — ABNORMAL LOW (ref 4.22–5.81)
RDW: 22.3 % — ABNORMAL HIGH (ref 11.5–15.5)
Smear Review: NORMAL
WBC: 16.4 10*3/uL — ABNORMAL HIGH (ref 4.0–10.5)
nRBC: 0 % (ref 0.0–0.2)

## 2020-05-23 MED ORDER — ACETAMINOPHEN 325 MG PO TABS: 650.0000 mg | ORAL_TABLET | ORAL | Status: DC | PRN

## 2020-05-23 MED ORDER — ALUM & MAG HYDROXIDE-SIMETH 200-200-20 MG/5ML PO SUSP
30.0000 mL | Freq: Four times a day (QID) | ORAL | Status: DC | PRN
  Administered 2020-05-23: 30 mL via ORAL
  Filled 2020-05-23: qty 30

## 2020-05-23 NOTE — Progress Notes (Addendum)
Afebrile. BP down but normal pulse at 5 AM. Asymptomatic when walking on night shift. Reports no SOB with ambulation as had been noted prior to transfusion pre-op. Minimal pain. Occasional oxycodone usage. No IV MS required.  No nausea. NO FLATUS. Inspirex at 1250. (No cough). Lungs: Distant, but clear. Cardio: RR. ABD: Moderate distension, much softer than yesterday. Few BS noted. Extrem: Soft.  Labs reviewed: Minimal bump in WBC to 16,400 from 14, 500 pre op.  No left shift.  HGB at 7.8, preop 9.2 (post transfusion, 8.2) consistent with known intra-operative blood loss. Lytes, renal function normal.   I&O: No urine output recorded for day shift yesterday,  350 cc  Last PM, similar volume in the urinal noted on rounds this AM.   Path reviewed: 10/ 52 lymph nodes positive. Invasion from cecum into SB mesentery.  T4b,N2b.  IMP: Ileus.  Significant handling of bowel and dissection at the base of the mesentery to remove extensive nodal disease likely culprit.   Two doses of oxycodone yesterday, will d/c entereg.  Plan: Check KUB, continue aggressive ambulation.

## 2020-05-23 NOTE — Care Management Important Message (Signed)
Important Message  Patient Details  Name: James Hodge MRN: [id number] Date of Birth: 1946-04-13   Medicare Important Message Given:  Yes     Dannette Barbara 05/23/2020, 1:59 PM

## 2020-05-24 NOTE — Progress Notes (Signed)
AVSS.  No nausea, mild heartburn relieved w/ maalox. No flatus, had the urge to move his bowels several times yesterday. Lungs: Clear. ABD: Soft, better BS. Wound: Clean. Extrem: Soft. Ambulating frequently by report. I&O record still spotty.  Doing well, hopeful for resolution of ileus in next 24-48 hours.

## 2020-05-24 NOTE — Plan of Care (Signed)
Continuing with plan of care. 

## 2020-05-25 MED ORDER — METOCLOPRAMIDE HCL 5 MG PO TABS
5.0000 mg | ORAL_TABLET | Freq: Three times a day (TID) | ORAL | Status: DC
  Administered 2020-05-25 – 2020-05-26 (×5): 5 mg via ORAL
  Filled 2020-05-25 (×5): qty 1

## 2020-05-25 NOTE — Plan of Care (Signed)
Continuing with plan of care. 

## 2020-05-25 NOTE — Progress Notes (Signed)
Patient has been ambulating around the room throughout the shift and walked out in the hall once; tolerated both well.  Patient had a nap the afternoon, up to eat dinner now.

## 2020-05-25 NOTE — Progress Notes (Signed)
AVSS. Passed flatus multiple times earlier this AM.  No nausea. Hungry. No SOB w/ ambulation. Lungs: Clear. Cardio: RR. ABD: Modest distension, soft. Wound: Clean. Extrem: Soft.  IMP: Slow resolution of post op ileus.  Plan: Advance to full liquids. Encourage ambulation.

## 2020-05-26 MED ORDER — METOCLOPRAMIDE HCL 5 MG PO TABS: ORAL_TABLET | ORAL | 0 refills | Status: DC

## 2020-05-26 MED ORDER — ENOXAPARIN SODIUM 40 MG/0.4ML ~~LOC~~ SOLN: 40.0000 mg | SUBCUTANEOUS | 0 refills | Status: DC

## 2020-05-26 NOTE — Plan of Care (Signed)
Discharge teaching completed with patient who verbalized understanding of teaching.  Patient is in stable condition.  Patient educated on how to self administer Lovenox shots and the usage of alcohol wipes along with leaving the air in the syringe - patient verbalized understanding.

## 2020-05-26 NOTE — Final Progress Note (Signed)
AVSS. Tolerated full liquids without problem.  Multiple stools yesterday, loose as expected.  Denies recurrent heartburn. No nausea. No pain.   Denies bloated sensation.  Ambulating well by RN report.  Lungs: Clear. Inspirex at 1500. (To continue at home).  Cardio: RR.  ABD: Soft, minimal distension. Non-tender.  Wounds: Clean.  Extrem: Soft.   IMP: Doing well.  Plan: Lovenox daily for 2 weeks to minimize DVT risk.   Soft diet at home. Reviewed in detail.

## 2020-05-28 ENCOUNTER — Other Ambulatory Visit: Payer: Self-pay | Admitting: Pathology

## 2020-05-29 LAB — SURGICAL PATHOLOGY

## 2020-05-30 ENCOUNTER — Other Ambulatory Visit: Payer: Medicare HMO

## 2020-05-30 NOTE — Progress Notes (Signed)
Tumor Board Documentation  James Hodge was presented by Dr Rogue Bussing at our Tumor Board on 05/30/2020, which included representatives from medical oncology, radiation oncology, internal medicine, navigation, pathology, radiology, surgical, pharmacy, genetics, research, palliative care, pulmonology.  James Hodge currently presents as a new patient, for [place], for new positive pathology with history of the following treatments: surgical intervention(s), active survellience.  Additionally, we reviewed previous medical and familial history, history of present illness, and recent lab results along with all available histopathologic and imaging studies. The tumor board considered available treatment options and made the following recommendations: Chemotherapy    The following procedures/referrals were also placed: No orders of the defined types were placed in this encounter.   Clinical Trial Status: not discussed   Staging used: AJCC Stage Group  AJCC Staging: T: 4b N: 2b   Group: Mod Invasive Adenocarcinoma of Colon   National site-specific guidelines NCCN were discussed with respect to the case.  Tumor board is a meeting of clinicians from various specialty areas who evaluate and discuss patients for whom a multidisciplinary approach is being considered. Final determinations in the plan of care are those of the provider(s). The responsibility for follow up of recommendations given during tumor board is that of the provider.   Today's extended care, comprehensive team conference, James Hodge was not present for the discussion and was not examined.   Multidisciplinary Tumor Board is a multidisciplinary case peer review process.  Decisions discussed in the Multidisciplinary Tumor Board reflect the opinions of the specialists present at the conference without having examined the patient.  Ultimately, treatment and diagnostic decisions rest with the primary provider(s) and the patient.

## 2020-06-01 ENCOUNTER — Telehealth: Payer: Self-pay | Admitting: Internal Medicine

## 2020-06-01 DIAGNOSIS — C182 Malignant neoplasm of ascending colon: Secondary | ICD-10-CM

## 2020-06-01 NOTE — Telephone Encounter (Signed)
Referral from Dr.Byrnett Dx; colon cancer.   M- please schedule app- in the week of 19th; MD; labs- cbc/cmp/CEA.   Thanks GB

## 2020-06-02 NOTE — Discharge Summary (Signed)
Physician Discharge Summary  Patient ID: James Hodge MRN: [id number] DOB/AGE: [birth year] 74 y.o.  Admit date: 05/20/2020 Discharge date: 06/02/2020  Admission Diagnoses:  Discharge Diagnoses:  Active Problems:   Colon cancer [facility])   Discharged Condition: good  Hospital Course: 74 year old male with microcytic anemia.  Colonoscopy showed a mass in the proximal ascending colon.  He was admitted for planned right hemicolectomy.  Extensive nodal involvement was noted on CT imaging.  Normal CEA.  He underwent a laparoscopically assisted right hemicolectomy the day of admission.  He showed some fairly prominent bloating the first 24 hours and had a modest ileus that precluded early discharge.  He ambulated early and often.  Vigorous pulmonary toilet with incentive spirometer.  IV fluids to maintain adequate electrolyte balance.  Minimal pain.  He was started on clear liquids and advance to a soft diet without difficulty beginning on postoperative day 5.  He required no narcotics in the preceding 48 hours prior to discharge.  He was sent home with oral Reglan.    Consults: None  Significant Diagnostic Studies: Pathology showed a 7+ centimeter tumor with involvement of the adjacent small bowel serosa, 10 of 52 nodes positive.  T4,N4.  Treatments: IV hydration  Discharge Exam: Blood pressure 112/69, pulse 84, temperature 97.9 F (36.6 C), temperature source Oral, resp. rate 20, height 6\' 3"  (1.905 m), weight 95.1 kg, SpO2 97 %. General appearance: cooperative and appears stated age  Pulmonary exam was clear.  Cardiac exam showed a regular rhythm.  Abdominal examination showed near normal profile.  Normal bowel sounds.  No tenderness.  Midline wound healing without evidence of infection or inflammation.  Extremities were soft and nontender.  The patient will be discharged home on 2 weeks of subcu Lovenox to minimize the risk of late DVT.  Arrangements are in place for outpatient  follow-up with both myself and medical oncology.  Disposition: Discharge disposition: 01-Home or Self Care       Discharge Instructions    Diet - low sodium heart healthy   Complete by: As directed    Discharge instructions   Complete by: As directed    OK to shower.   No lifting over 10 pounds.    No driving until pain free.  Soft diet: avoid raw vegetables, fruit skins. Eat small amounts frequently.    Drink at least 32 ounces of liquids daily.  Lovenox shots into abdomen: Once daily for two weeks. (To lower risk of blood clots.)  Reglan (metoclopramide): Before breakfast, mid afternoon and bedtime: To help stomach work better.  Tylenol: If needed for soreness.  Heating pad to abdomen for comfort.   Call me if any questions or concerns:  [phone].  Selinda Eon from the office will call you on Tuesday to arrange a follow up appointment.   Increase activity slowly   Complete by: As directed    No wound care   Complete by: As directed      Allergies as of 05/26/2020   No Known Allergies     Medication List    TAKE these medications   Breo Ellipta 100-25 MCG/INH Aepb Generic drug: fluticasone furoate-vilanterol Inhale 1 puff into the lungs at bedtime.   enoxaparin 40 MG/0.4ML injection Commonly known as: LOVENOX Inject 0.4 mLs (40 mg total) into the skin daily for 14 days.   lisinopril 10 MG tablet Commonly known as: ZESTRIL Take 10 mg by mouth daily.   metoCLOPramide 5 MG tablet Commonly known as: REGLAN Take before breakfast,  mid-afternoon and bedtime.   tiotropium 18 MCG inhalation capsule Commonly known as: SPIRIVA Place 18 mcg into inhaler and inhale at bedtime.       Follow-up Information    [facility] .        Robert Bellow, MD Follow up.   Specialties: General Surgery, Radiology Why: Selinda Eon will contact you on July 6 to arrange follow up visit the week of July 12.  Contact information: [place]  [phone]               Signed: Robert Bellow 06/02/2020, 6:45 PM

## 2020-06-03 NOTE — Addendum Note (Signed)
Addended by: Gloris Ham on: 06/03/2020 08:56 AM   Modules accepted: Orders

## 2020-06-11 ENCOUNTER — Inpatient Hospital Stay: Payer: Medicare HMO | Attending: Internal Medicine | Admitting: Internal Medicine

## 2020-06-11 ENCOUNTER — Inpatient Hospital Stay: Payer: Medicare HMO

## 2020-06-11 ENCOUNTER — Encounter: Payer: Self-pay | Admitting: Internal Medicine

## 2020-06-11 DIAGNOSIS — Z7189 Other specified counseling: Secondary | ICD-10-CM | POA: Insufficient documentation

## 2020-06-11 DIAGNOSIS — K922 Gastrointestinal hemorrhage, unspecified: Secondary | ICD-10-CM | POA: Insufficient documentation

## 2020-06-11 DIAGNOSIS — C182 Malignant neoplasm of ascending colon: Secondary | ICD-10-CM

## 2020-06-11 DIAGNOSIS — J449 Chronic obstructive pulmonary disease, unspecified: Secondary | ICD-10-CM

## 2020-06-11 DIAGNOSIS — D500 Iron deficiency anemia secondary to blood loss (chronic): Secondary | ICD-10-CM | POA: Diagnosis not present

## 2020-06-11 DIAGNOSIS — E611 Iron deficiency: Secondary | ICD-10-CM

## 2020-06-11 LAB — CBC WITH DIFFERENTIAL/PLATELET
Abs Immature Granulocytes: 0.04 10*3/uL (ref 0.00–0.07)
Basophils Absolute: 0.1 10*3/uL (ref 0.0–0.1)
Basophils Relative: 1 %
Eosinophils Absolute: 0.3 10*3/uL (ref 0.0–0.5)
Eosinophils Relative: 3 %
HCT: 26.3 % — ABNORMAL LOW (ref 39.0–52.0)
Hemoglobin: 7.8 g/dL — ABNORMAL LOW (ref 13.0–17.0)
Immature Granulocytes: 0 %
Lymphocytes Relative: 25 %
Lymphs Abs: 2.5 10*3/uL (ref 0.7–4.0)
MCH: 21.8 pg — ABNORMAL LOW (ref 26.0–34.0)
MCHC: 29.7 g/dL — ABNORMAL LOW (ref 30.0–36.0)
MCV: 73.5 fL — ABNORMAL LOW (ref 80.0–100.0)
Monocytes Absolute: 0.9 10*3/uL (ref 0.1–1.0)
Monocytes Relative: 8 %
Neutro Abs: 6.4 10*3/uL (ref 1.7–7.7)
Neutrophils Relative %: 63 %
Platelets: 434 10*3/uL — ABNORMAL HIGH (ref 150–400)
RBC: 3.58 MIL/uL — ABNORMAL LOW (ref 4.22–5.81)
RDW: 21.9 % — ABNORMAL HIGH (ref 11.5–15.5)
WBC: 10.1 10*3/uL (ref 4.0–10.5)
nRBC: 0 % (ref 0.0–0.2)

## 2020-06-11 LAB — COMPREHENSIVE METABOLIC PANEL
ALT: 18 U/L (ref 0–44)
AST: 22 U/L (ref 15–41)
Albumin: 3.1 g/dL — ABNORMAL LOW (ref 3.5–5.0)
Alkaline Phosphatase: 51 U/L (ref 38–126)
Anion gap: 6 (ref 5–15)
BUN: 15 mg/dL (ref 8–23)
CO2: 25 mmol/L (ref 22–32)
Calcium: 8.2 mg/dL — ABNORMAL LOW (ref 8.9–10.3)
Chloride: 110 mmol/L (ref 98–111)
Creatinine, Ser: 0.75 mg/dL (ref 0.61–1.24)
GFR calc Af Amer: >60 mL/min (ref >60)
GFR calc non Af Amer: >60 mL/min (ref >60)
Glucose, Bld: 117 mg/dL — ABNORMAL HIGH (ref 70–99)
Potassium: 3.7 mmol/L (ref 3.5–5.1)
Sodium: 141 mmol/L (ref 135–145)
Total Bilirubin: 0.9 mg/dL (ref 0.3–1.2)
Total Protein: 6.4 g/dL — ABNORMAL LOW (ref 6.5–8.1)

## 2020-06-11 LAB — IRON AND TIBC
Iron: 12 ug/dL — ABNORMAL LOW (ref 45–182)
Saturation Ratios: 3 % — ABNORMAL LOW (ref 17.9–39.5)
TIBC: 420 ug/dL (ref 250–450)
UIBC: 408 ug/dL

## 2020-06-11 LAB — FERRITIN: Ferritin: 9 ng/mL — ABNORMAL LOW (ref 24–336)

## 2020-06-11 NOTE — Progress Notes (Signed)
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  - TEN OF FIFTY-TWO LYMPH NODES POSITIVE FOR METASTATIC ADENOCARCINOMA  (10/52).  - SINGLE INCIDENTAL TUBULOVILLOUS ADENOMA.  - THREE INCIDENTAL TUBULAR ADENOMAS.  - SEE CANCER SUMMARY.   CANCER CASE SUMMARY: COLON AND RECTUM  Procedure: Right hemicolectomy  Tumor Site: Cecum  Tumor Size: Greatest dimension: 12.2 cm  Macroscopic Tumor Perforation: Present  Histologic Type: Adenocarcinoma  Histologic Grade: G2: Moderately differentiated  Tumor Extension: Tumor directly invades adjacent structures (terminal  ileum)  Margins: All margins are uninvolved by invasive carcinoma, high-grade  dysplasia/intramucosal adenocarcinoma, and low-grade dysplasia  Treatment Effect: No known presurgical therapy  Lymphovascular Invasion: Present  Perineural Invasion: Present  Tumor Deposits: Present: Multiple  Regional Lymph Nodes:       Number of Lymph Nodes Involved: 10       Number of Lymph Nodes Examined: 19   # COPD- [Dr.Fleming];   # SURVIVORSHIP:   # GENETICS:   DIAGNOSIS:   STAGE:         ;  GOALS:  CURRENT/MOST RECENT THERAPY :     Cancer of right colon ([place])  05/20/2020 Initial Diagnosis   Cancer of right colon ([place])      HISTORY OF PRESENTING ILLNESS:  James Hodge 74 y.o.  male longstanding history of COPD has been referred to [place] for new diagnosis of colon cancer.  Patient had poor exercise tolerance/worsening shortness of breath for the last few months. Further work-up noted to have anemia and subsequent colonoscopy showed a partially obstructing  mass in the ascending colon.  Preoperative CT showed enlarged mesenteric lymph nodes.  No evidence of hepatic disease.  Normal preoperative CEA. Patient underwent subsequent right hemicolectomy; lymph node harvest.  Patient is recovering fairly well from surgery. Continues to complain of shortness of breath on exertion. And extreme fatigue.   Review of Systems  Constitutional: Positive for malaise/fatigue and weight loss. Negative for chills, diaphoresis and fever.  HENT: Negative for nosebleeds and sore throat.   Eyes: Negative for double vision.  Respiratory: Positive for cough and shortness of breath. Negative for hemoptysis, sputum production and wheezing.   Cardiovascular: Negative for chest pain, palpitations, orthopnea and leg swelling.  Gastrointestinal: Negative for abdominal pain, blood in stool, constipation, diarrhea, heartburn, melena, nausea and vomiting.  Genitourinary: Negative for dysuria, frequency and urgency.  Musculoskeletal: Negative for back pain and joint pain.  Skin: Negative.  Negative for itching and rash.  Neurological: Positive for tingling. Negative for dizziness, focal weakness, weakness and headaches.  Endo/Heme/Allergies: Does not bruise/bleed easily.  Psychiatric/Behavioral: Negative for depression. The patient is not nervous/anxious and does not have insomnia.      MEDICAL HISTORY:  Past Medical History:  Diagnosis Date  . Cancer ([place])   . COPD (chronic obstructive pulmonary disease) ([place])   . Hypertension     SURGICAL HISTORY: Past Surgical History:  Procedure Laterality Date  . CHOLECYSTECTOMY     Around 2001  . COLONOSCOPY WITH PROPOFOL N/A 04/12/2020   Procedure: COLONOSCOPY WITH PROPOFOL;  Surgeon: Robert Bellow, MD;  Location: [facility] ENDOSCOPY;  Service: Endoscopy;  Laterality: N/A;  . COLONOSCOPY WITH  PROPOFOL N/A 04/24/2020   Procedure: COLONOSCOPY WITH PROPOFOL;  Surgeon: Robert Bellow, MD;  Location: [facility] ENDOSCOPY;  Service:  Endoscopy;  Laterality: N/A;  . ESOPHAGOGASTRODUODENOSCOPY (EGD) WITH PROPOFOL N/A 04/12/2020   Procedure: ESOPHAGOGASTRODUODENOSCOPY (EGD) WITH PROPOFOL;  Surgeon: Robert Bellow, MD;  Location: [facility] ENDOSCOPY;  Service: Endoscopy;  Laterality: N/A;  . EYE SURGERY Bilateral    cataract  . LAPAROSCOPIC PARTIAL COLECTOMY Right 05/20/2020   Procedure: LAPAROSCOPIC PARTIAL COLECTOMY;  Surgeon: Robert Bellow, MD;  Location: [facility] ORS;  Service: General;  Laterality: Right;  Floyce Stakes, RNFA to assist    SOCIAL HISTORY: Social History   Socioeconomic History  . Marital status: Married    Spouse name: Not on file  . Number of children: Not on file  . Years of education: Not on file  . Highest education level: Not on file  Occupational History  . Not on file  Tobacco Use  . Smoking status: Former Smoker    Years: 3.00    Types: Cigarettes    Quit date: 11/23/1968    Years since quitting: 51.5  . Smokeless tobacco: Never Used  Vaping Use  . Vaping Use: Never used  Substance and Sexual Activity  . Alcohol use: Not Currently  . Drug use: Never  . Sexual activity: Not on file  Other Topics Concern  . Not on file  Social History Narrative   Lives in [place]; smoked- quit in 70s; beer social; 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; grandson    Social Determinants of Health   Financial Resource Strain:   . Difficulty of Paying Living Expenses:   Food Insecurity:   . Worried About Charity fundraiser in the Last Year:   . Arboriculturist in the Last Year:   Transportation Needs:   . Film/video editor (Medical):   Marland Kitchen Lack of Transportation (Non-Medical):   Physical Activity:   . Days of Exercise per Week:   . Minutes of Exercise per Session:   Stress:   . Feeling of Stress :   Social Connections:   . Frequency of Communication with Friends and Family:   . Frequency of Social Gatherings with Friends and Family:   . Attends Religious Services:   . Active Member of Clubs or  Organizations:   . Attends Archivist Meetings:   Marland Kitchen Marital Status:   Intimate Partner Violence:   . Fear of Current or Ex-Partner:   . Emotionally Abused:   Marland Kitchen Physically Abused:   . Sexually Abused:     FAMILY HISTORY: Family History  Problem Relation Age of Onset  . Multiple sclerosis Brother     ALLERGIES:  has No Known Allergies.  MEDICATIONS:  Current Outpatient Medications  Medication Sig Dispense Refill  . BREO ELLIPTA 100-25 MCG/INH AEPB Inhale 1 puff into the lungs at bedtime.    Marland Kitchen tiotropium (SPIRIVA) 18 MCG inhalation capsule Place 18 mcg into inhaler and inhale at bedtime.     . enoxaparin (LOVENOX) 40 MG/0.4ML injection Inject 0.4 mLs (40 mg total) into the skin daily for 14 days. 5.6 mL 0  . lisinopril (ZESTRIL) 10 MG tablet Take 10 mg by mouth daily. (Patient not taking: Reported on 06/11/2020)    . metoCLOPramide (REGLAN) 5 MG tablet Take before breakfast, mid-afternoon and bedtime. (Patient not taking: Reported on 06/11/2020) 40 tablet 0   No current facility-administered medications for this visit.      Marland Kitchen  PHYSICAL EXAMINATION: ECOG PERFORMANCE STATUS: 1 -  Symptomatic but completely ambulatory  Vitals:   06/11/20 1110  BP: 105/69  Pulse: 91  Resp: 18  Temp: 97.7 F (36.5 C)  SpO2: 99%   Filed Weights   06/11/20 1110  Weight: 209 lb 12.8 oz (95.2 kg)    Physical Exam Constitutional:      Comments: Elderly male patient.  He is in a wheelchair.  Positive for pallor.  Accompanied by his son.  HENT:     Head: Normocephalic and atraumatic.     Mouth/Throat:     Pharynx: No oropharyngeal exudate.  Eyes:     Pupils: Pupils are equal, round, and reactive to light.  Cardiovascular:     Rate and Rhythm: Normal rate and regular rhythm.  Pulmonary:     Effort: No respiratory distress.     Breath sounds: No wheezing.     Comments: Decreased air entry bilaterally.  No wheeze or crackles Abdominal:     General: Bowel sounds are normal. There  is no distension.     Palpations: Abdomen is soft. There is no mass.     Tenderness: There is no abdominal tenderness. There is no guarding or rebound.  Musculoskeletal:        General: No tenderness. Normal range of motion.     Cervical back: Normal range of motion and neck supple.  Skin:    General: Skin is warm.  Neurological:     Mental Status: He is alert and oriented to person, place, and time.  Psychiatric:        Mood and Affect: Affect normal.      LABORATORY DATA:  I have reviewed the data as listed Lab Results  Component Value Date   WBC 10.1 06/11/2020   HGB 7.8 (L) 06/11/2020   HCT 26.3 (L) 06/11/2020   MCV 73.5 (L) 06/11/2020   PLT 434 (H) 06/11/2020   Recent Labs    05/16/20 1025 05/23/20 0503 06/11/20 1206  NA 136 135 141  K 3.9 4.0 3.7  CL 103 101 110  CO2 24 28 25   GLUCOSE 118* 114* 117*  BUN 15 10 15   CREATININE 0.76 0.78 0.75  CALCIUM 8.5* 8.3* 8.2*  GFRNONAA >60 >60 >60  GFRAA >60 >60 >60  PROT  --   --  6.4*  ALBUMIN  --   --  3.1*  AST  --   --  22  ALT  --   --  18  ALKPHOS  --   --  51  BILITOT  --   --  0.9    RADIOGRAPHIC STUDIES: I have personally reviewed the radiological images as listed and agreed with the findings in the report. DG ABD ACUTE 2+V W 1V CHEST  Result Date: 05/23/2020 CLINICAL DATA:  Ileus.  Status post partial colectomy. EXAM: DG ABDOMEN ACUTE W/ 1V CHEST COMPARISON:  None. FINDINGS: Free air is noted underneath both hemidiaphragms consistent with recent abdominal surgery. Status post cholecystectomy. Dilated and fluid-filled large and small bowel loops are noted most consistent with postoperative ileus. Heart size and mediastinal contours are within normal limits. Both lungs are clear. IMPRESSION: Dilated and fluid-filled large and small bowel loops are noted most consistent with postoperative ileus. Electronically Signed   By: Marijo Conception M.D.   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  Baseline CT scan abdomen pelvis negative for metastatic disease.  Recommend PET scan for  further evaluation.  #Patient has high risk of recurrent disease based on high risk features noted on pathology. I would recommend adjuvant FOLFOX for total of 6 months. Understands goal of treatment is cure; however there is risk of recurrent disease even after adjuvant chemotherapy.  # I discussed that FOLFOX chemotherapy is given every 2 weeks; discuss the potential side effects including but not limited to nausea vomiting diarrhea, sores in the mouth, hand-foot syndrome; also tingling and numbness/cold sensitivity with oxaliplatin.  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  # COPD-continue inhalers as per pulmonary.  Thank you Dr. Tollie Pizza for allowing me to participate in the care of your pleasant patient. Please do not hesitate to contact me with questions or concerns in the interim. Patient is interested in chemotherapy; recommend a port. Chemotherapy education next visit.  # DISPOSITION: # labs today- cbc/cmp/;cea; iron studies/ferritin- please order.  # PET ASAP # follow up MD; 1-2 days post PET scan; possible Venofer-Dr.B   All questions were answered. The patient knows to call the clinic with any problems, questions or concerns.    Cammie Sickle, MD 06/11/2020 12:59 PM

## 2020-06-11 NOTE — Assessment & Plan Note (Addendum)
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  Baseline CT scan abdomen pelvis negative for metastatic disease.  Recommend PET scan for further evaluation.  #Patient has high risk of recurrent disease based on high risk features noted on pathology. I would recommend adjuvant FOLFOX for total of 6 months. Understands goal of treatment is cure; however there is risk of recurrent disease even after adjuvant chemotherapy.  # I discussed that FOLFOX chemotherapy is given every 2 weeks; discuss the potential side effects including but not limited to nausea vomiting diarrhea, sores in the mouth, hand-foot syndrome; also tingling and numbness/cold sensitivity with oxaliplatin.  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  # COPD-continue inhalers as per pulmonary.  Thank you Dr. Tollie Pizza for allowing me to participate in the care of your pleasant patient. Please do not hesitate to contact me with questions or concerns in the interim. Patient is interested in chemotherapy; recommend a port. Chemotherapy education next visit.  # DISPOSITION: # labs today- cbc/cmp/;cea; iron studies/ferritin- please order.  # PET ASAP # follow up MD; 1-2 days post PET scan; possible Venofer-Dr.B

## 2020-06-12 LAB — CEA: CEA: 5.3 ng/mL — ABNORMAL HIGH (ref 0.0–4.7)

## 2020-06-13 ENCOUNTER — Other Ambulatory Visit: Payer: Self-pay | Admitting: General Surgery

## 2020-06-14 ENCOUNTER — Inpatient Hospital Stay: Admission: RE | Admit: 2020-06-14 | Payer: Medicare HMO | Source: Ambulatory Visit

## 2020-06-17 ENCOUNTER — Other Ambulatory Visit: Payer: Self-pay

## 2020-06-17 ENCOUNTER — Other Ambulatory Visit
Admission: RE | Admit: 2020-06-17 | Discharge: 2020-06-17 | Disposition: A | Discharge status: Home / Self Care | Payer: Medicare HMO | Source: Ambulatory Visit | Attending: General Surgery | Admitting: General Surgery

## 2020-06-17 DIAGNOSIS — Z20822 Contact with and (suspected) exposure to covid-19: Secondary | ICD-10-CM | POA: Diagnosis not present

## 2020-06-17 DIAGNOSIS — Z01812 Encounter for preprocedural laboratory examination: Secondary | ICD-10-CM | POA: Diagnosis present

## 2020-06-18 LAB — SARS CORONAVIRUS 2 (TAT 6-24 HRS): SARS Coronavirus 2: NEGATIVE

## 2020-06-19 ENCOUNTER — Ambulatory Visit: Payer: Medicare HMO

## 2020-06-19 ENCOUNTER — Ambulatory Visit: Payer: Medicare HMO | Admitting: Anesthesiology

## 2020-06-19 ENCOUNTER — Ambulatory Visit
Admission: RE | Admit: 2020-06-19 | Discharge: 2020-06-19 | Disposition: A | Discharge status: Home / Self Care | Payer: Medicare HMO | Attending: General Surgery | Admitting: General Surgery

## 2020-06-19 ENCOUNTER — Other Ambulatory Visit: Payer: Self-pay

## 2020-06-19 ENCOUNTER — Encounter
Admission: RE | Disposition: A | Discharge status: Home / Self Care | Payer: Self-pay | Source: Home / Self Care | Attending: General Surgery

## 2020-06-19 ENCOUNTER — Encounter: Payer: Self-pay | Admitting: General Surgery

## 2020-06-19 ENCOUNTER — Ambulatory Visit
Admission: RE | Admit: 2020-06-19 | Discharge: 2020-06-19 | Disposition: A | Discharge status: Home / Self Care | Payer: Medicare HMO | Source: Ambulatory Visit | Attending: Internal Medicine | Admitting: Internal Medicine

## 2020-06-19 DIAGNOSIS — Z95828 Presence of other vascular implants and grafts: Secondary | ICD-10-CM

## 2020-06-19 DIAGNOSIS — Z7951 Long term (current) use of inhaled steroids: Secondary | ICD-10-CM | POA: Insufficient documentation

## 2020-06-19 DIAGNOSIS — C189 Malignant neoplasm of colon, unspecified: Secondary | ICD-10-CM | POA: Diagnosis present

## 2020-06-19 DIAGNOSIS — Z87891 Personal history of nicotine dependence: Secondary | ICD-10-CM | POA: Insufficient documentation

## 2020-06-19 DIAGNOSIS — C182 Malignant neoplasm of ascending colon: Secondary | ICD-10-CM

## 2020-06-19 DIAGNOSIS — I10 Essential (primary) hypertension: Secondary | ICD-10-CM | POA: Insufficient documentation

## 2020-06-19 DIAGNOSIS — J449 Chronic obstructive pulmonary disease, unspecified: Secondary | ICD-10-CM | POA: Diagnosis not present

## 2020-06-19 DIAGNOSIS — Z79899 Other long term (current) drug therapy: Secondary | ICD-10-CM | POA: Diagnosis not present

## 2020-06-19 HISTORY — PX: PORTACATH PLACEMENT: SHX2246

## 2020-06-19 LAB — GLUCOSE, CAPILLARY: Glucose-Capillary: 103 mg/dL — ABNORMAL HIGH (ref 70–99)

## 2020-06-19 SURGERY — INSERTION, TUNNELED CENTRAL VENOUS DEVICE, WITH PORT
Anesthesia: Monitor Anesthesia Care | Site: Chest | Laterality: Left

## 2020-06-19 MED ORDER — LIDOCAINE HCL (PF) 2 % IJ SOLN
INTRAMUSCULAR | Status: AC
  Filled 2020-06-19: qty 5

## 2020-06-19 MED ORDER — LIDOCAINE HCL (PF) 1 % IJ SOLN
INTRAMUSCULAR | Status: AC
  Filled 2020-06-19: qty 30

## 2020-06-19 MED ORDER — SODIUM CHLORIDE FLUSH 0.9 % IV SOLN
INTRAVENOUS | Status: AC
  Filled 2020-06-19: qty 50

## 2020-06-19 MED ORDER — FAMOTIDINE 20 MG PO TABS
20.0000 mg | ORAL_TABLET | Freq: Once | ORAL | Status: AC
  Administered 2020-06-19: 20 mg via ORAL

## 2020-06-19 MED ORDER — SODIUM CHLORIDE (PF) 0.9 % IJ SOLN
INTRAMUSCULAR | Status: DC | PRN
  Administered 2020-06-19: 10 mL

## 2020-06-19 MED ORDER — ONDANSETRON HCL 4 MG/2ML IJ SOLN
INTRAMUSCULAR | Status: AC
  Filled 2020-06-19: qty 2

## 2020-06-19 MED ORDER — FENTANYL CITRATE (PF) 100 MCG/2ML IJ SOLN: 25.0000 ug | INTRAMUSCULAR | Status: DC | PRN

## 2020-06-19 MED ORDER — DEXMEDETOMIDINE HCL 200 MCG/2ML IV SOLN
INTRAVENOUS | Status: DC | PRN
  Administered 2020-06-19: 12 ug via INTRAVENOUS

## 2020-06-19 MED ORDER — FLUDEOXYGLUCOSE F - 18 (FDG) INJECTION
10.8000 | Freq: Once | INTRAVENOUS | Status: AC | PRN
  Administered 2020-06-19: 11.28 via INTRAVENOUS

## 2020-06-19 MED ORDER — ORAL CARE MOUTH RINSE: 15.0000 mL | Freq: Once | OROMUCOSAL | Status: AC

## 2020-06-19 MED ORDER — LIDOCAINE HCL 1 % IJ SOLN
INTRAMUSCULAR | Status: DC | PRN
  Administered 2020-06-19: 10 mL

## 2020-06-19 MED ORDER — CHLORHEXIDINE GLUCONATE 0.12 % MT SOLN
15.0000 mL | Freq: Once | OROMUCOSAL | Status: AC
  Administered 2020-06-19: 15 mL via OROMUCOSAL

## 2020-06-19 MED ORDER — FAMOTIDINE 20 MG PO TABS
ORAL_TABLET | ORAL | Status: AC
  Filled 2020-06-19: qty 1

## 2020-06-19 MED ORDER — CEFAZOLIN SODIUM-DEXTROSE 2-4 GM/100ML-% IV SOLN
2.0000 g | INTRAVENOUS | Status: AC
  Administered 2020-06-19: 2 g via INTRAVENOUS

## 2020-06-19 MED ORDER — PROPOFOL 10 MG/ML IV BOLUS
INTRAVENOUS | Status: DC | PRN
  Administered 2020-06-19: 70 mg via INTRAVENOUS

## 2020-06-19 MED ORDER — CEFAZOLIN SODIUM 1 G IJ SOLR
INTRAMUSCULAR | Status: AC
  Filled 2020-06-19: qty 20

## 2020-06-19 MED ORDER — PROPOFOL 500 MG/50ML IV EMUL
INTRAVENOUS | Status: AC
  Filled 2020-06-19: qty 50

## 2020-06-19 MED ORDER — CHLORHEXIDINE GLUCONATE 0.12 % MT SOLN
OROMUCOSAL | Status: AC
  Filled 2020-06-19: qty 15

## 2020-06-19 MED ORDER — LACTATED RINGERS IV SOLN: INTRAVENOUS | Status: DC

## 2020-06-19 MED ORDER — ONDANSETRON HCL 4 MG/2ML IJ SOLN
INTRAMUSCULAR | Status: DC | PRN
  Administered 2020-06-19: 4 mg via INTRAVENOUS

## 2020-06-19 MED ORDER — PROPOFOL 500 MG/50ML IV EMUL
INTRAVENOUS | Status: DC | PRN
  Administered 2020-06-19: 50 ug/kg/min via INTRAVENOUS

## 2020-06-19 MED ORDER — ONDANSETRON HCL 4 MG/2ML IJ SOLN: 4.0000 mg | Freq: Once | INTRAMUSCULAR | Status: DC | PRN

## 2020-06-19 SURGICAL SUPPLY — 33 items
APL PRP STRL LF DISP 70% ISPRP (MISCELLANEOUS) ×1
BAG DECANTER FOR FLEXI CONT (MISCELLANEOUS) ×3 IMPLANT
BLADE SURG 15 STRL SS SAFETY (BLADE) ×3 IMPLANT
CHLORAPREP W/TINT 26 (MISCELLANEOUS) ×3 IMPLANT
CLOSURE WOUND 1/2 X4 (GAUZE/BANDAGES/DRESSINGS) ×1
COVER LIGHT HANDLE STERIS (MISCELLANEOUS) ×6 IMPLANT
COVER WAND RF STERILE (DRAPES) ×3 IMPLANT
DECANTER SPIKE VIAL GLASS SM (MISCELLANEOUS) ×6 IMPLANT
DRAPE C-ARM XRAY 36X54 (DRAPES) ×3 IMPLANT
DRAPE LAPAROTOMY TRNSV 106X77 (MISCELLANEOUS) ×3 IMPLANT
DRSG TEGADERM 2-3/8X2-3/4 SM (GAUZE/BANDAGES/DRESSINGS) ×3 IMPLANT
DRSG TEGADERM 4X4.75 (GAUZE/BANDAGES/DRESSINGS) ×3 IMPLANT
DRSG TELFA 4X3 1S NADH ST (GAUZE/BANDAGES/DRESSINGS) ×3 IMPLANT
ELECT REM PT RETURN 9FT ADLT (ELECTROSURGICAL) ×3
ELECTRODE REM PT RTRN 9FT ADLT (ELECTROSURGICAL) ×1 IMPLANT
GLOVE BIO SURGEON STRL SZ7.5 (GLOVE) ×3 IMPLANT
GLOVE INDICATOR 8.0 STRL GRN (GLOVE) ×3 IMPLANT
GOWN STRL REUS W/ TWL LRG LVL3 (GOWN DISPOSABLE) ×2 IMPLANT
GOWN STRL REUS W/TWL LRG LVL3 (GOWN DISPOSABLE) ×6
IV NS 500ML (IV SOLUTION) ×3
IV NS 500ML BAXH (IV SOLUTION) ×1 IMPLANT
KIT PORT POWER 8FR ISP CVUE (Port) ×3 IMPLANT
KIT TURNOVER KIT A (KITS) ×3 IMPLANT
LABEL OR SOLS (LABEL) ×3 IMPLANT
PACK PORT-A-CATH (MISCELLANEOUS) ×3 IMPLANT
STRIP CLOSURE SKIN 1/2X4 (GAUZE/BANDAGES/DRESSINGS) ×2 IMPLANT
SUT PROLENE 3 0 SH DA (SUTURE) ×3 IMPLANT
SUT VIC AB 3-0 SH 27 (SUTURE) ×3
SUT VIC AB 3-0 SH 27X BRD (SUTURE) ×1 IMPLANT
SUT VIC AB 4-0 FS2 27 (SUTURE) ×3 IMPLANT
SWABSTK COMLB BENZOIN TINCTURE (MISCELLANEOUS) ×3 IMPLANT
SYR 10ML LL (SYRINGE) ×3 IMPLANT
SYR 10ML SLIP (SYRINGE) ×3 IMPLANT

## 2020-06-19 NOTE — Op Note (Signed)
Preoperative diagnosis: Stage IV colon cancer, need for central venous access.  Postoperative diagnosis: Same.  Operative procedure: Left subclavian PowerPort placement with ultrasound and fluoroscopic guidance.  Operating surgeon: Hervey Ard, MD.  Anesthesia: Attended local, 10 cc 1% plain Xylocaine.  Estimated blood loss: 0.  Clinical note: This 74 year old male recently underwent a right hemicolectomy with stage IV disease identified.  No distant metastatic disease appreciated on subsequent PET scan.  Candidate for adjuvant chemotherapy.  Operative note: The patient received Ancef on induction of anesthesia.  The left chest was cleansed with ChloraPrep and draped.  Ultrasound was used to confirm patency of the vein.  The vein was then cannulated ultrasound guidance.  Image obtained.  The guidewire was advanced into the IVC and then pulled back to the SVC/right atrial junction.  The dilator was placed followed by the catheter.  Under fluoroscopic guidance this was positioned at the junction of the SVC and right atrium.  It easily aspirated and irrigated in this location.  It was tunneled to a pocket on the left anterior chest.  The port was anchored to the deep tissue with interrupted 3-0 Prolene sutures x2.  The deep tissue was approximated with a running 3-0 Vicryl suture.  The skin was closed with a running 4-0 Vicryl subcuticular suture.  Benzoin, Steri-Strips, Telfa and Tegaderm dressing applied.  Portable chest x-ray in recovery room showed the catheter tip as described above and no evidence of pneumothorax.

## 2020-06-19 NOTE — Anesthesia Preprocedure Evaluation (Signed)
Anesthesia Evaluation  Patient identified by MRN, date of birth, ID band Patient awake    Reviewed: Allergy & Precautions, H&P , NPO status , Patient's Chart, lab work & pertinent test results, reviewed documented beta blocker date and time   History of Anesthesia Complications Negative for: history of anesthetic complications  Airway Mallampati: II  TM Distance: >3 FB Neck ROM: full    Dental  (+) Edentulous Lower, Edentulous Upper   Pulmonary neg sleep apnea, COPD,  COPD inhaler, Patient abstained from smoking.Not current smoker, former smoker,    Pulmonary exam normal breath sounds clear to auscultation       Cardiovascular Exercise Tolerance: Good METShypertension, (-) CAD and (-) Past MI Normal cardiovascular exam(-) dysrhythmias  Rhythm:regular Rate:Normal     Neuro/Psych negative neurological ROS  negative psych ROS   GI/Hepatic negative GI ROS, Neg liver ROS, neg GERD  ,  Endo/Other  negative endocrine ROSneg diabetes  Renal/GU negative Renal ROS  negative genitourinary   Musculoskeletal   Abdominal   Peds  Hematology negative hematology ROS (+)   Anesthesia Other Findings Past Medical History: No date: COPD (chronic obstructive pulmonary disease) ([place]) Past Surgical History: No date: CHOLECYSTECTOMY     Comment:  Around 2001 No date: EYE SURGERY BMI    Body Mass Index: 27.76 kg/m     Reproductive/Obstetrics negative OB ROS                             Anesthesia Physical  Anesthesia Plan  ASA: II  Anesthesia Plan: General and MAC   Post-op Pain Management:    Induction: Intravenous  PONV Risk Score and Plan: 2 and Ondansetron and TIVA  Airway Management Planned: Nasal Cannula  Additional Equipment: None  Intra-op Plan:   Post-operative Plan:   Informed Consent: I have reviewed the patients History and Physical, chart, labs and discussed the procedure  including the risks, benefits and alternatives for the proposed anesthesia with the patient or authorized representative who has indicated his/her understanding and acceptance.     Dental Advisory Given  Plan Discussed with: CRNA  Anesthesia Plan Comments: (Discussed risks of anesthesia with patient, including possibility of difficulty with spontaneous ventilation under anesthesia necessitating airway intervention, PONV, and rare risks such as cardiac or respiratory or neurological events. Patient understands.)        Anesthesia Quick Evaluation

## 2020-06-19 NOTE — OR Nursing (Signed)
Patient discharged to home via wheelchair with son.  Both verbalized understanding of instructions.

## 2020-06-19 NOTE — Transfer of Care (Signed)
Immediate Anesthesia Transfer of Care Note  Patient: James Hodge  Procedure(s) Performed: INSERTION PORT-A-CATH (Left Chest)  Patient Location: PACU  Anesthesia Type:General  Level of Consciousness: drowsy and patient cooperative  Airway & Oxygen Therapy: Patient Spontanous Breathing and Patient connected to nasal cannula oxygen  Post-op Assessment: Report given to RN and Post -op Vital signs reviewed and stable  Post vital signs: Reviewed and stable  Last Vitals:  Vitals Value Taken Time  BP 98/57 06/19/20 1343  Temp 36.2 C 06/19/20 1342  Pulse 70 06/19/20 1344  Resp 9 06/19/20 1344  SpO2 100 % 06/19/20 1344  Vitals shown include unvalidated device data.  Last Pain:  Vitals:   06/19/20 1342  TempSrc:   PainSc: 0-No pain         Complications: No complications documented.

## 2020-06-19 NOTE — Discharge Instructions (Signed)

## 2020-06-19 NOTE — H&P (Signed)
James Hodge [id number] 11-21-1946     HPI:   Patient with Stage IV colon cancer for adjuvant chemotherapy. Port requested.   Medications Prior to Admission  Medication Sig Dispense Refill Last Dose  . BREO ELLIPTA 100-25 MCG/INH AEPB Inhale 1 puff into the lungs at bedtime.     Marland Kitchen lisinopril (ZESTRIL) 10 MG tablet Take 10 mg by mouth daily.      Marland Kitchen tiotropium (SPIRIVA) 18 MCG inhalation capsule Place 18 mcg into inhaler and inhale at bedtime.      . enoxaparin (LOVENOX) 40 MG/0.4ML injection Inject 0.4 mLs (40 mg total) into the skin daily for 14 days. 5.6 mL 0   . metoCLOPramide (REGLAN) 5 MG tablet Take before breakfast, mid-afternoon and bedtime. (Patient not taking: Reported on 06/11/2020) 40 tablet 0    No Known Allergies Past Medical History:  Diagnosis Date  . Cancer ([place])   . COPD (chronic obstructive pulmonary disease) ([place])   . Hypertension    Past Surgical History:  Procedure Laterality Date  . CHOLECYSTECTOMY     Around 2001  . COLONOSCOPY WITH PROPOFOL N/A 04/12/2020   Procedure: COLONOSCOPY WITH PROPOFOL;  Surgeon: Robert Bellow, MD;  Location: [facility] ENDOSCOPY;  Service: Endoscopy;  Laterality: N/A;  . COLONOSCOPY WITH PROPOFOL N/A 04/24/2020   Procedure: COLONOSCOPY WITH PROPOFOL;  Surgeon: Robert Bellow, MD;  Location: [facility] ENDOSCOPY;  Service: Endoscopy;  Laterality: N/A;  . ESOPHAGOGASTRODUODENOSCOPY (EGD) WITH PROPOFOL N/A 04/12/2020   Procedure: ESOPHAGOGASTRODUODENOSCOPY (EGD) WITH PROPOFOL;  Surgeon: Robert Bellow, MD;  Location: [facility] ENDOSCOPY;  Service: Endoscopy;  Laterality: N/A;  . EYE SURGERY Bilateral    cataract  . LAPAROSCOPIC PARTIAL COLECTOMY Right 05/20/2020   Procedure: LAPAROSCOPIC PARTIAL COLECTOMY;  Surgeon: Robert Bellow, MD;  Location: [facility] ORS;  Service: General;  Laterality: Right;  Floyce Stakes, RNFA to assist   Social History   Socioeconomic History  . Marital status: Married    Spouse name: Not on file  . Number of  children: Not on file  . Years of education: Not on file  . Highest education level: Not on file  Occupational History  . Not on file  Tobacco Use  . Smoking status: Former Smoker    Years: 3.00    Types: Cigarettes    Quit date: 11/23/1968    Years since quitting: 51.6  . Smokeless tobacco: Never Used  Vaping Use  . Vaping Use: Never used  Substance and Sexual Activity  . Alcohol use: Not Currently  . Drug use: Never  . Sexual activity: Not on file  Other Topics Concern  . Not on file  Social History Narrative   Lives in [place]; smoked- quit in 70s; beer social; 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; grandson    Social Determinants of Health   Financial Resource Strain:   . Difficulty of Paying Living Expenses:   Food Insecurity:   . Worried About Charity fundraiser in the Last Year:   . Arboriculturist in the Last Year:   Transportation Needs:   . Film/video editor (Medical):   Marland Kitchen Lack of Transportation (Non-Medical):   Physical Activity:   . Days of Exercise per Week:   . Minutes of Exercise per Session:   Stress:   . Feeling of Stress :   Social Connections:   . Frequency of Communication with Friends and Family:   . Frequency of Social Gatherings with Friends and Family:   . Attends Religious Services:   .  Active Member of Clubs or Organizations:   . Attends Archivist Meetings:   Marland Kitchen Marital Status:   Intimate Partner Violence:   . Fear of Current or Ex-Partner:   . Emotionally Abused:   Marland Kitchen Physically Abused:   . 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     PE: HEENT: Negative. Lungs: Clear. Cardio: RRForest Gleason Germain Koopmann 06/19/2020   Assessment/Plan:  Proceed with planned port placement.

## 2020-06-19 NOTE — Anesthesia Postprocedure Evaluation (Signed)
Anesthesia Post Note  Patient: James Hodge  Procedure(s) Performed: INSERTION PORT-A-CATH (Left Chest)  Patient location during evaluation: PACU Anesthesia Type: MAC Level of consciousness: awake and alert and oriented Pain management: pain level controlled Vital Signs Assessment: post-procedure vital signs reviewed and stable Respiratory status: spontaneous breathing Cardiovascular status: blood pressure returned to baseline Anesthetic complications: no   No complications documented.   Last Vitals:  Vitals:   06/19/20 1412 06/19/20 1428  BP: 122/67 127/74  Pulse: 52 73  Resp: 16 18  Temp: (!) 36.2 C 36.7 C  SpO2: 99% 98%    Last Pain:  Vitals:   06/19/20 1428  TempSrc: Temporal  PainSc: 0-No pain                 Chandler Stofer

## 2020-06-20 ENCOUNTER — Encounter: Payer: Self-pay | Admitting: Internal Medicine

## 2020-06-20 ENCOUNTER — Other Ambulatory Visit: Payer: Self-pay | Admitting: *Deleted

## 2020-06-20 ENCOUNTER — Other Ambulatory Visit: Payer: Self-pay

## 2020-06-20 DIAGNOSIS — Z95828 Presence of other vascular implants and grafts: Secondary | ICD-10-CM

## 2020-06-20 MED ORDER — LIDOCAINE-PRILOCAINE 2.5-2.5 % EX CREA: 1.0000 "application " | TOPICAL_CREAM | CUTANEOUS | 3 refills | Status: DC | PRN

## 2020-06-21 ENCOUNTER — Inpatient Hospital Stay: Payer: Medicare HMO

## 2020-06-21 ENCOUNTER — Inpatient Hospital Stay: Payer: Medicare HMO | Admitting: Internal Medicine

## 2020-06-21 ENCOUNTER — Encounter: Payer: Self-pay | Admitting: Internal Medicine

## 2020-06-21 VITALS — BP 110/72 | HR 91 | Temp 98.0°F | Resp 18 | Ht 75.0 in | Wt 213.0 lb

## 2020-06-21 VITALS — BP 120/67 | HR 60 | Temp 96.0°F | Resp 18

## 2020-06-21 DIAGNOSIS — C182 Malignant neoplasm of ascending colon: Secondary | ICD-10-CM | POA: Diagnosis not present

## 2020-06-21 DIAGNOSIS — E611 Iron deficiency: Secondary | ICD-10-CM

## 2020-06-21 MED ORDER — HEPARIN SOD (PORK) LOCK FLUSH 100 UNIT/ML IV SOLN
500.0000 [IU] | Freq: Once | INTRAVENOUS | Status: AC | PRN
  Administered 2020-06-21: 500 [IU]
  Filled 2020-06-21: qty 5

## 2020-06-21 MED ORDER — IRON SUCROSE 20 MG/ML IV SOLN
200.0000 mg | Freq: Once | INTRAVENOUS | Status: AC
  Administered 2020-06-21: 200 mg via INTRAVENOUS
  Filled 2020-06-21: qty 10

## 2020-06-21 MED ORDER — SODIUM CHLORIDE 0.9 % IV SOLN
Freq: Once | INTRAVENOUS | Status: AC
  Filled 2020-06-21: qty 250

## 2020-06-21 MED ORDER — HEPARIN SOD (PORK) LOCK FLUSH 100 UNIT/ML IV SOLN
INTRAVENOUS | Status: AC
  Filled 2020-06-21: qty 5

## 2020-06-21 MED ORDER — SODIUM CHLORIDE 0.9 % IV SOLN: 200.0000 mg | Freq: Once | INTRAVENOUS | Status: DC

## 2020-06-21 MED ORDER — PROCHLORPERAZINE MALEATE 10 MG PO TABS
10.0000 mg | ORAL_TABLET | Freq: Four times a day (QID) | ORAL | 1 refills | Status: AC | PRN
Start: 2020-06-21 — End: ?

## 2020-06-21 MED ORDER — SODIUM CHLORIDE 0.9% FLUSH
10.0000 mL | Freq: Once | INTRAVENOUS | Status: AC | PRN
  Administered 2020-06-21: 10 mL
  Filled 2020-06-21: qty 10

## 2020-06-21 MED ORDER — ONDANSETRON HCL 8 MG PO TABS: ORAL_TABLET | ORAL | 1 refills | Status: AC

## 2020-06-21 NOTE — Assessment & Plan Note (Addendum)
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  PET scan-no evidence of distant site disease.  #Patient has high risk of recurrent disease based on high risk features noted on pathology. I would recommend adjuvant FOLFOX for total of 6 months. Understands goal of treatment is cure; however there is risk of recurrent disease even after adjuvant chemotherapy.  # I discussed that FOLFOX chemotherapy is given every 2 weeks; discuss the potential side effects including but not limited to nausea vomiting diarrhea, sores in the mouth, hand-foot syndrome; also tingling and numbness/cold sensitivity with oxaliplatin.  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 Discussed the potential acute infusion reactions with IV iron; which are quite rare.  Patient understands the risk; will proceed with infusions.   # COPD-continue inhalers as per pulmonary.   # DISPOSITION: # Venoder today # chemo education next week-; VEnofer # follow up 9th aug; labs- cbc/cmp;FOLFOX; [new] Venofer-Dr.B

## 2020-06-21 NOTE — Progress Notes (Signed)
New start venofer - hand off given to [place], Therapist, sports

## 2020-06-21 NOTE — Progress Notes (Addendum)
Initial venofer infusion complete, patient tolerated well. No s/s and vitals stable. Newly port accessed, flushes well. No issues. Patient educated and d/c'd.

## 2020-06-21 NOTE — Patient Instructions (Signed)
Oxaliplatin Injection What is this medicine? OXALIPLATIN (ox AL i PLA tin) is a chemotherapy drug. It targets fast dividing cells, like cancer cells, and causes these cells to die. This medicine is used to treat cancers of the colon and rectum, and many other cancers. This medicine may be used for other purposes; ask your health care provider or pharmacist if you have questions. COMMON BRAND NAME(S): Eloxatin What should I tell my health care provider before I take this medicine? They need to know if you have any of these conditions:  heart disease  history of irregular heartbeat  liver disease  low blood counts, like white cells, platelets, or red blood cells  lung or breathing disease, like asthma  take medicines that treat or prevent blood clots  tingling of the fingers or toes, or other nerve disorder  an unusual or allergic reaction to oxaliplatin, other chemotherapy, other medicines, foods, dyes, or preservatives  pregnant or trying to get pregnant  breast-feeding How should I use this medicine? This drug is given as an infusion into a vein. It is administered in a hospital or clinic by a specially trained health care professional. Talk to your pediatrician regarding the use of this medicine in children. Special care may be needed. Overdosage: If you think you have taken too much of this medicine contact a poison control center or emergency room at once. NOTE: This medicine is only for you. Do not share this medicine with others. What if I miss a dose? It is important not to miss a dose. Call your doctor or health care professional if you are unable to keep an appointment. What may interact with this medicine? Do not take this medicine with any of the following medications:  cisapride  dronedarone  pimozide  thioridazine This medicine may also interact with the following medications:  aspirin and aspirin-like medicines  certain medicines that treat or prevent  blood clots like warfarin, apixaban, dabigatran, and rivaroxaban  cisplatin  cyclosporine  diuretics  medicines for infection like acyclovir, adefovir, amphotericin B, bacitracin, cidofovir, foscarnet, ganciclovir, gentamicin, pentamidine, vancomycin  NSAIDs, medicines for pain and inflammation, like ibuprofen or naproxen  other medicines that prolong the QT interval (an abnormal heart rhythm)  pamidronate  zoledronic acid This list may not describe all possible interactions. Give your health care provider a list of all the medicines, herbs, non-prescription drugs, or dietary supplements you use. Also tell them if you smoke, drink alcohol, or use illegal drugs. Some items may interact with your medicine. What should I watch for while using this medicine? Your condition will be monitored carefully while you are receiving this medicine. You may need blood work done while you are taking this medicine. This medicine may make you feel generally unwell. This is not uncommon as chemotherapy can affect healthy cells as well as cancer cells. Report any side effects. Continue your course of treatment even though you feel ill unless your healthcare professional tells you to stop. This medicine can make you more sensitive to cold. Do not drink cold drinks or use ice. Cover exposed skin before coming in contact with cold temperatures or cold objects. When out in cold weather wear warm clothing and cover your mouth and nose to warm the air that goes into your lungs. Tell your doctor if you get sensitive to the cold. Do not become pregnant while taking this medicine or for 9 months after stopping it. Women should inform their health care professional if they wish to become   pregnant or think they might be pregnant. Men should not father a child while taking this medicine and for 6 months after stopping it. There is potential for serious side effects to an unborn child. Talk to your health care professional  for more information. Do not breast-feed a child while taking this medicine or for 3 months after stopping it. This medicine has caused ovarian failure in some women. This medicine may make it more difficult to get pregnant. Talk to your health care professional if you are concerned about your fertility. This medicine has caused decreased sperm counts in some men. This may make it more difficult to father a child. Talk to your health care professional if you are concerned about your fertility. This medicine may increase your risk of getting an infection. Call your health care professional for advice if you get a fever, chills, or sore throat, or other symptoms of a cold or flu. Do not treat yourself. Try to avoid being around people who are sick. Avoid taking medicines that contain aspirin, acetaminophen, ibuprofen, naproxen, or ketoprofen unless instructed by your health care professional. These medicines may hide a fever. Be careful brushing or flossing your teeth or using a toothpick because you may get an infection or bleed more easily. If you have any dental work done, tell your dentist you are receiving this medicine. What side effects may I notice from receiving this medicine? Side effects that you should report to your doctor or health care professional as soon as possible:  allergic reactions like skin rash, itching or hives, swelling of the face, lips, or tongue  breathing problems  cough  low blood counts - this medicine may decrease the number of white blood cells, red blood cells, and platelets. You may be at increased risk for infections and bleeding  nausea, vomiting  pain, redness, or irritation at site where injected  pain, tingling, numbness in the hands or feet  signs and symptoms of bleeding such as bloody or black, tarry stools; red or dark brown urine; spitting up blood or brown material that looks like coffee grounds; red spots on the skin; unusual bruising or bleeding  from the eyes, gums, or nose  signs and symptoms of a dangerous change in heartbeat or heart rhythm like chest pain; dizziness; fast, irregular heartbeat; palpitations; feeling faint or lightheaded; falls  signs and symptoms of infection like fever; chills; cough; sore throat; pain or trouble passing urine  signs and symptoms of liver injury like dark yellow or brown urine; general ill feeling or flu-like symptoms; light-colored stools; loss of appetite; nausea; right upper belly pain; unusually weak or tired; yellowing of the eyes or skin  signs and symptoms of low red blood cells or anemia such as unusually weak or tired; feeling faint or lightheaded; falls  signs and symptoms of muscle injury like dark urine; trouble passing urine or change in the amount of urine; unusually weak or tired; muscle pain; back pain Side effects that usually do not require medical attention (report to your doctor or health care professional if they continue or are bothersome):  changes in taste  diarrhea  gas  hair loss  loss of appetite  mouth sores This list may not describe all possible side effects. Call your doctor for medical advice about side effects. You may report side effects to FDA at 1-800-FDA-1088. Where should I keep my medicine? This drug is given in a hospital or clinic and will not be stored at home. NOTE:   This sheet is a summary. It may not cover all possible information. If you have questions about this medicine, talk to your doctor, pharmacist, or health care provider.  2020 [employer] Standard (2019-03-29 12:20:35) Fluorouracil, 5-FU injection What is this medicine? FLUOROURACIL, 5-FU (flure oh YOOR a sil) is a chemotherapy drug. It slows the growth of cancer cells. This medicine is used to treat many types of cancer like breast cancer, colon or rectal cancer, pancreatic cancer, and stomach cancer. This medicine may be used for other purposes; ask your health care provider or  pharmacist if you have questions. COMMON BRAND NAME(S): Adrucil What should I tell my health care provider before I take this medicine? They need to know if you have any of these conditions:  blood disorders  dihydropyrimidine dehydrogenase (DPD) deficiency  infection (especially a virus infection such as chickenpox, cold sores, or herpes)  kidney disease  liver disease  malnourished, poor nutrition  recent or ongoing radiation therapy  an unusual or allergic reaction to fluorouracil, other chemotherapy, other medicines, foods, dyes, or preservatives  pregnant or trying to get pregnant  breast-feeding How should I use this medicine? This drug is given as an infusion or injection into a vein. It is administered in a hospital or clinic by a specially trained health care professional. Talk to your pediatrician regarding the use of this medicine in children. Special care may be needed. Overdosage: If you think you have taken too much of this medicine contact a poison control center or emergency room at once. NOTE: This medicine is only for you. Do not share this medicine with others. What if I miss a dose? It is important not to miss your dose. Call your doctor or health care professional if you are unable to keep an appointment. What may interact with this medicine?  allopurinol  cimetidine  dapsone  digoxin  hydroxyurea  leucovorin  levamisole  medicines for seizures like ethotoin, fosphenytoin, phenytoin  medicines to increase blood counts like filgrastim, pegfilgrastim, sargramostim  medicines that treat or prevent blood clots like warfarin, enoxaparin, and dalteparin  methotrexate  metronidazole  pyrimethamine  some other chemotherapy drugs like busulfan, cisplatin, estramustine, vinblastine  trimethoprim  trimetrexate  vaccines Talk to your doctor or health care professional before taking any of these  medicines:  acetaminophen  aspirin  ibuprofen  ketoprofen  naproxen This list may not describe all possible interactions. Give your health care provider a list of all the medicines, herbs, non-prescription drugs, or dietary supplements you use. Also tell them if you smoke, drink alcohol, or use illegal drugs. Some items may interact with your medicine. What should I watch for while using this medicine? Visit your doctor for checks on your progress. This drug may make you feel generally unwell. This is not uncommon, as chemotherapy can affect healthy cells as well as cancer cells. Report any side effects. Continue your course of treatment even though you feel ill unless your doctor tells you to stop. In some cases, you may be given additional medicines to help with side effects. Follow all directions for their use. Call your doctor or health care professional for advice if you get a fever, chills or sore throat, or other symptoms of a cold or flu. Do not treat yourself. This drug decreases your body's ability to fight infections. Try to avoid being around people who are sick. This medicine may increase your risk to bruise or bleed. Call your doctor or health care professional if you notice any   unusual bleeding. Be careful brushing and flossing your teeth or using a toothpick because you may get an infection or bleed more easily. If you have any dental work done, tell your dentist you are receiving this medicine. Avoid taking products that contain aspirin, acetaminophen, ibuprofen, naproxen, or ketoprofen unless instructed by your doctor. These medicines may hide a fever. Do not become pregnant while taking this medicine. Women should inform their doctor if they wish to become pregnant or think they might be pregnant. There is a potential for serious side effects to an unborn child. Talk to your health care professional or pharmacist for more information. Do not breast-feed an infant while taking  this medicine. Men should inform their doctor if they wish to father a child. This medicine may lower sperm counts. Do not treat diarrhea with over the counter products. Contact your doctor if you have diarrhea that lasts more than 2 days or if it is severe and watery. This medicine can make you more sensitive to the sun. Keep out of the sun. If you cannot avoid being in the sun, wear protective clothing and use sunscreen. Do not use sun lamps or tanning beds/booths. What side effects may I notice from receiving this medicine? Side effects that you should report to your doctor or health care professional as soon as possible:  allergic reactions like skin rash, itching or hives, swelling of the face, lips, or tongue  low blood counts - this medicine may decrease the number of white blood cells, red blood cells and platelets. You may be at increased risk for infections and bleeding.  signs of infection - fever or chills, cough, sore throat, pain or difficulty passing urine  signs of decreased platelets or bleeding - bruising, pinpoint red spots on the skin, black, tarry stools, blood in the urine  signs of decreased red blood cells - unusually weak or tired, fainting spells, lightheadedness  breathing problems  changes in vision  chest pain  mouth sores  nausea and vomiting  pain, swelling, redness at site where injected  pain, tingling, numbness in the hands or feet  redness, swelling, or sores on hands or feet  stomach pain  unusual bleeding Side effects that usually do not require medical attention (report to your doctor or health care professional if they continue or are bothersome):  changes in finger or toe nails  diarrhea  dry or itchy skin  hair loss  headache  loss of appetite  sensitivity of eyes to the light  stomach upset  unusually teary eyes This list may not describe all possible side effects. Call your doctor for medical advice about side effects.  You may report side effects to FDA at 1-800-FDA-1088. Where should I keep my medicine? This drug is given in a hospital or clinic and will not be stored at home. NOTE: This sheet is a summary. It may not cover all possible information. If you have questions about this medicine, talk to your doctor, pharmacist, or health care provider.  2020 [employer] Standard (2008-03-14 13:53:16) Leucovorin injection What is this medicine? LEUCOVORIN (loo koe VOR in) is used to prevent or treat the harmful effects of some medicines. This medicine is used to treat anemia caused by a low amount of folic acid in the body. It is also used with 5-fluorouracil (5-FU) to treat colon cancer. This medicine may be used for other purposes; ask your health care provider or pharmacist if you have questions. What should I tell my health care   provider before I take this medicine? They need to know if you have any of these conditions:  anemia from low levels of vitamin B-12 in the blood  an unusual or allergic reaction to leucovorin, folic acid, other medicines, foods, dyes, or preservatives  pregnant or trying to get pregnant  breast-feeding How should I use this medicine? This medicine is for injection into a muscle or into a vein. It is given by a health care professional in a hospital or clinic setting. Talk to your pediatrician regarding the use of this medicine in children. Special care may be needed. Overdosage: If you think you have taken too much of this medicine contact a poison control center or emergency room at once. NOTE: This medicine is only for you. Do not share this medicine with others. What if I miss a dose? This does not apply. What may interact with this medicine?  capecitabine  fluorouracil  phenobarbital  phenytoin  primidone  trimethoprim-sulfamethoxazole This list may not describe all possible interactions. Give your health care provider a list of all the medicines, herbs,  non-prescription drugs, or dietary supplements you use. Also tell them if you smoke, drink alcohol, or use illegal drugs. Some items may interact with your medicine. What should I watch for while using this medicine? Your condition will be monitored carefully while you are receiving this medicine. This medicine may increase the side effects of 5-fluorouracil, 5-FU. Tell your doctor or health care professional if you have diarrhea or mouth sores that do not get better or that get worse. What side effects may I notice from receiving this medicine? Side effects that you should report to your doctor or health care professional as soon as possible:  allergic reactions like skin rash, itching or hives, swelling of the face, lips, or tongue  breathing problems  fever, infection  mouth sores  unusual bleeding or bruising  unusually weak or tired Side effects that usually do not require medical attention (report to your doctor or health care professional if they continue or are bothersome):  constipation or diarrhea  loss of appetite  nausea, vomiting This list may not describe all possible side effects. Call your doctor for medical advice about side effects. You may report side effects to FDA at 1-800-FDA-1088. Where should I keep my medicine? This drug is given in a hospital or clinic and will not be stored at home. NOTE: This sheet is a summary. It may not cover all possible information. If you have questions about this medicine, talk to your doctor, pharmacist, or health care provider.  2020 [employer] Standard (2008-05-15 16:50:29)  

## 2020-06-23 NOTE — Progress Notes (Signed)
[name]  Patient Care Team: Albina Billet, MD as PCP - General (Internal Medicine) Clent Jacks, RN as Oncology Nurse Navigator Cammie Sickle, MD as Consulting Physician (Hematology and Oncology)  CHIEF COMPLAINTS/PURPOSE OF CONSULTATION: Colon cancer  #  Oncology History Overview Note  # July 2021- RIGHT COLON ADENO CARCINOMA- Dr.Byrnett s/p right hemicolectomy- pT4b N2b- STAGE III [high risk]; MSI-STABLE.    - INVASIVE MODERATELY DIFFERENTIATED ADENOCARCINOMA.  - TEN OF FIFTY-TWO LYMPH NODES POSITIVE FOR METASTATIC ADENOCARCINOMA  (10/52).  - SINGLE INCIDENTAL TUBULOVILLOUS ADENOMA.  - THREE INCIDENTAL TUBULAR ADENOMAS.  - SEE CANCER SUMMARY.   CANCER CASE SUMMARY: COLON AND RECTUM  Procedure: Right hemicolectomy  Tumor Site: Cecum  Tumor Size: Greatest dimension: 12.2 cm  Macroscopic Tumor Perforation: Present  Histologic Type: Adenocarcinoma  Histologic Grade: G2: Moderately differentiated  Tumor Extension: Tumor directly invades adjacent structures (terminal  ileum)  Margins: All margins are uninvolved by invasive carcinoma, high-grade  dysplasia/intramucosal adenocarcinoma, and low-grade dysplasia  Treatment Effect: No known presurgical therapy  Lymphovascular Invasion: Present  Perineural Invasion: Present  Tumor Deposits: Present: Multiple  Regional Lymph Nodes:       Number of Lymph Nodes Involved: 10       Number of Lymph Nodes Examined: 16   # COPD- [Dr.Fleming];   # SURVIVORSHIP:   # GENETICS:   DIAGNOSIS:   STAGE:         ;  GOALS:  CURRENT/MOST RECENT THERAPY :     Cancer of right colon ([place])  05/20/2020 Initial Diagnosis   Cancer of right colon ([place])   06/24/2020 -  Chemotherapy   The patient had DEXAMETHASONE 4 MG PO TABS, 8 mg, Oral, Daily, 0 of 1 cycle, Start date: --, End date: -- PALONOSETRON HCL INJECTION 0.25 MG/5ML, 0.25 mg, Intravenous,  Once, 0 of 12 cycles leucovorin 904 mg in dextrose 5 %  250 mL infusion, 400 mg/m2, Intravenous,  Once, 0 of 12 cycles oxaliplatin (ELOXATIN) 190 mg in dextrose 5 % 500 mL chemo infusion, 85 mg/m2, Intravenous,  Once, 0 of 12 cycles fluorouracil (ADRUCIL) chemo injection 900 mg, 400 mg/m2, Intravenous,  Once, 0 of 12 cycles fluorouracil (ADRUCIL) 5,400 mg in sodium chloride 0.9 % 142 mL chemo infusion, 2,400 mg/m2, Intravenous, 1 Day/Dose, 0 of 12 cycles  for chemotherapy treatment.       HISTORY OF PRESENTING ILLNESS:  James Hodge 74 y.o.  male newly diagnosed colon cancer status post surgery-is here today with results of the PET scan.  In the interim patient also underwent Mediport placement.  Patient denies any nausea vomiting abdominal pain.  No blood in stools or black or stools.  Review of Systems  Constitutional: Positive for malaise/fatigue and weight loss. Negative for chills, diaphoresis and fever.  HENT: Negative for nosebleeds and sore throat.   Eyes: Negative for double vision.  Respiratory: Positive for cough and shortness of breath. Negative for hemoptysis, sputum production and wheezing.   Cardiovascular: Negative for chest pain, palpitations, orthopnea and leg swelling.  Gastrointestinal: Negative for abdominal pain, blood in stool, constipation, diarrhea, heartburn, melena, nausea and vomiting.  Genitourinary: Negative for dysuria, frequency and urgency.  Musculoskeletal: Negative for back pain and joint pain.  Skin: Negative.  Negative for itching and rash.  Neurological: Positive for tingling. Negative for dizziness, focal weakness, weakness and headaches.  Endo/Heme/Allergies: Does not bruise/bleed easily.  Psychiatric/Behavioral: Negative for depression. The patient is not nervous/anxious and does not have insomnia.  MEDICAL HISTORY:  Past Medical History:  Diagnosis Date  . Cancer ([place])   . COPD (chronic obstructive pulmonary disease) ([place])   . Hypertension     SURGICAL HISTORY: Past Surgical History:   Procedure Laterality Date  . CHOLECYSTECTOMY     Around 2001  . COLONOSCOPY WITH PROPOFOL N/A 04/12/2020   Procedure: COLONOSCOPY WITH PROPOFOL;  Surgeon: Robert Bellow, MD;  Location: [facility] ENDOSCOPY;  Service: Endoscopy;  Laterality: N/A;  . COLONOSCOPY WITH PROPOFOL N/A 04/24/2020   Procedure: COLONOSCOPY WITH PROPOFOL;  Surgeon: Robert Bellow, MD;  Location: [facility] ENDOSCOPY;  Service: Endoscopy;  Laterality: N/A;  . ESOPHAGOGASTRODUODENOSCOPY (EGD) WITH PROPOFOL N/A 04/12/2020   Procedure: ESOPHAGOGASTRODUODENOSCOPY (EGD) WITH PROPOFOL;  Surgeon: Robert Bellow, MD;  Location: [facility] ENDOSCOPY;  Service: Endoscopy;  Laterality: N/A;  . EYE SURGERY Bilateral    cataract  . LAPAROSCOPIC PARTIAL COLECTOMY Right 05/20/2020   Procedure: LAPAROSCOPIC PARTIAL COLECTOMY;  Surgeon: Robert Bellow, MD;  Location: [facility] ORS;  Service: General;  Laterality: Right;  Floyce Stakes, RNFA to assist  . PORTACATH PLACEMENT Left 06/19/2020   Procedure: INSERTION PORT-A-CATH;  Surgeon: Robert Bellow, MD;  Location: [facility] ORS;  Service: General;  Laterality: Left;    SOCIAL HISTORY: Social History   Socioeconomic History  . Marital status: Married    Spouse name: Not on file  . Number of children: Not on file  . Years of education: Not on file  . Highest education level: Not on file  Occupational History  . Not on file  Tobacco Use  . Smoking status: Former Smoker    Years: 3.00    Types: Cigarettes    Quit date: 11/23/1968    Years since quitting: 51.6  . Smokeless tobacco: Never Used  Vaping Use  . Vaping Use: Never used  Substance and Sexual Activity  . Alcohol use: Not Currently  . Drug use: Never  . Sexual activity: Not on file  Other Topics Concern  . Not on file  Social History Narrative   Lives in [place]; smoked- quit in 70s; beer social; 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; grandson    Social Determinants of Health   Financial Resource Strain:   . Difficulty of Paying  Living Expenses:   Food Insecurity:   . Worried About Charity fundraiser in the Last Year:   . Arboriculturist in the Last Year:   Transportation Needs:   . Film/video editor (Medical):   Marland Kitchen Lack of Transportation (Non-Medical):   Physical Activity:   . Days of Exercise per Week:   . Minutes of Exercise per Session:   Stress:   . Feeling of Stress :   Social Connections:   . Frequency of Communication with Friends and Family:   . Frequency of Social Gatherings with Friends and Family:   . Attends Religious Services:   . Active Member of Clubs or Organizations:   . Attends Archivist Meetings:   Marland Kitchen Marital Status:   Intimate Partner Violence:   . Fear of Current or Ex-Partner:   . Emotionally Abused:   Marland Kitchen Physically Abused:   . Sexually Abused:     FAMILY HISTORY: Family History  Problem Relation Age of Onset  . Multiple sclerosis Brother     ALLERGIES:  has No Known Allergies.  MEDICATIONS:  Current Outpatient Medications  Medication Sig Dispense Refill  . BREO ELLIPTA 100-25 MCG/INH AEPB Inhale 1 puff into the lungs at bedtime.    Marland Kitchen  tiotropium (SPIRIVA) 18 MCG inhalation capsule Place 18 mcg into inhaler and inhale at bedtime.     . lidocaine-prilocaine (EMLA) cream Apply 1 application topically as needed. (Patient not taking: Reported on 06/20/2020) 30 g 3  . ondansetron (ZOFRAN) 8 MG tablet One pill every 8 hours as needed for nausea/vomitting. 40 tablet 1  . prochlorperazine (COMPAZINE) 10 MG tablet Take 1 tablet (10 mg total) by mouth every 6 (six) hours as needed for nausea or vomiting. 40 tablet 1   No current facility-administered medications for this visit.      Marland Kitchen  PHYSICAL EXAMINATION: ECOG PERFORMANCE STATUS: 1 - Symptomatic but completely ambulatory  Vitals:   06/21/20 0923  BP: 110/72  Pulse: 91  Resp: 18  Temp: 98 F (36.7 C)  SpO2: 99%   Filed Weights   06/21/20 0923  Weight: (!) 213 lb (96.6 kg)    Physical  Exam Constitutional:      Comments: Elderly male patient.  Patient is ambulating independently.  Accompanied by his son.  HENT:     Head: Normocephalic and atraumatic.     Mouth/Throat:     Pharynx: No oropharyngeal exudate.  Eyes:     Pupils: Pupils are equal, round, and reactive to light.  Cardiovascular:     Rate and Rhythm: Normal rate and regular rhythm.  Pulmonary:     Effort: No respiratory distress.     Breath sounds: No wheezing.     Comments: Decreased air entry bilaterally.  No wheeze or crackles Abdominal:     General: Bowel sounds are normal. There is no distension.     Palpations: Abdomen is soft. There is no mass.     Tenderness: There is no abdominal tenderness. There is no guarding or rebound.  Musculoskeletal:        General: No tenderness. Normal range of motion.     Cervical back: Normal range of motion and neck supple.  Skin:    General: Skin is warm.  Neurological:     Mental Status: He is alert and oriented to person, place, and time.  Psychiatric:        Mood and Affect: Affect normal.      LABORATORY DATA:  I have reviewed the data as listed Lab Results  Component Value Date   WBC 10.1 06/11/2020   HGB 7.8 (L) 06/11/2020   HCT 26.3 (L) 06/11/2020   MCV 73.5 (L) 06/11/2020   PLT 434 (H) 06/11/2020   Recent Labs    05/16/20 1025 05/23/20 0503 06/11/20 1206  NA 136 135 141  K 3.9 4.0 3.7  CL 103 101 110  CO2 _0 GLUCOSE 118* 114* 117*  BUN _1 CREATININE 0.76 0.78 0.75  CALCIUM 8.5* 8.3* 8.2*  GFRNONAA >60 >60 >60  GFRAA >60 >60 >60  PROT  --   --  6.4*  ALBUMIN  --   --  3.1*  AST  --   --  22  ALT  --   --  18  ALKPHOS  --   --  51  BILITOT  --   --  0.9    RADIOGRAPHIC STUDIES: I have personally reviewed the radiological images as listed and agreed with the findings in the report. NM PET Image Initial (PI) Skull Base To Thigh  Result Date: 06/19/2020 CLINICAL DATA:  Initial treatment strategy for right-sided  colon cancer. Colon resection 05/26/2020. No COVID vaccinations. COPD. EXAM: NUCLEAR MEDICINE PET SKULL BASE TO  THIGH TECHNIQUE: 11.3 mCi F-18 FDG was injected intravenously. Full-ring PET imaging was performed from the skull base to thigh after the radiotracer. CT data was obtained and used for attenuation correction and anatomic localization. Fasting blood glucose: 103 mg/dl COMPARISON:  05/06/2020 FINDINGS: Mediastinal blood pool activity: SUV max 2.4 Liver activity: SUV max NA NECK: No areas of abnormal hypermetabolism. Incidental CT findings: Bilateral carotid atherosclerosis. No cervical adenopathy. CHEST: No pulmonary parenchymal or thoracic nodal hypermetabolism. Incidental CT findings: Development of small, left larger than right pleural effusions since the prior abdominal CT. Aortic and lad coronary artery atherosclerosis. Mild cardiomegaly. A subpleural 4 mm right lower lobe pulmonary nodule on 100/3 is most likely a subpleural lymph node. Mild centrilobular emphysema. ABDOMEN/PELVIS: Midline abdominal wall hypermetabolism is likely related to prior laparotomy. Right hemicolectomy. The previously described pericolonic adenopathy has presumably also been resected. No abdominopelvic nodal or parenchymal hypermetabolism. Incidental CT findings: Normal adrenal glands. Large colonic stool burden. Abdominal aortic atherosclerosis. Circumaortic left renal vein. Mild prostatomegaly. SKELETON: No osseous hypermetabolism. A focus of hypermetabolism about the medial left upper extremity, at the junction of the muscular and subcutaneous tissues is without CT correlate. Measures a S.U.V. max of 17.5, including on approximately image 42/3. Incidental CT findings: none IMPRESSION: 1. Status post interval right hemicolectomy without typical findings of hypermetabolic residual or metastatic disease. 2. Focus of hypermetabolism within the medial left upper extremity. Given the lack of definite CT correlate, and the fact  the patient was administered tracer in the left antecubital fossa, this is favored to be related to tracer accumulation. Felt unlikely to represent metastatic disease. This could be either be re-evaluated on follow-up PET, or if more entire characterization is desired, a left upper extremity pre and post-contrast MRI performed. 3. New bilateral pleural effusions. 4. Incidental findings, including: Aortic Atherosclerosis (ICD10-I70.0). Possible constipation. Emphysema (ICD10-J43.9). Coronary artery atherosclerosis. Electronically Signed   By: Abigail Miyamoto M.D.   On: 06/19/2020 14:04   DG Chest Port 1 View  Result Date: 06/19/2020 CLINICAL DATA:  Port-A-Cath placement EXAM: PORTABLE CHEST 1 VIEW COMPARISON:  None. FINDINGS: Power port placed from a left subclavian approach. Catheter tip in the SVC 1 cm above the right atrium. No pneumothorax. Lungs are clear. IMPRESSION: Power port tip in the SVC 1 cm above the right atrium. No pneumothorax. No active disease. Electronically Signed   By: Nelson Chimes M.D.   On: 06/19/2020 14:16   DG C-Arm 1-60 Min-No Report  Result Date: 06/19/2020 Fluoroscopy was utilized by the requesting physician.  No radiographic interpretation.    ASSESSMENT & PLAN:   Cancer of right colon ([place]) #Colon cancer stage III-PT4BN2B- high risk.  PET scan-no evidence of distant site disease.  #Patient has high risk of recurrent disease based on high risk features noted on pathology. I would recommend adjuvant FOLFOX for total of 6 months. Understands goal of treatment is cure; however there is risk of recurrent disease even after adjuvant chemotherapy.  # I discussed that FOLFOX chemotherapy is given every 2 weeks; discuss the potential side effects including but not limited to nausea vomiting diarrhea, sores in the mouth, hand-foot syndrome; also tingling and numbness/cold sensitivity with oxaliplatin.  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 Discussed the potential acute infusion reactions with IV iron; which are quite rare.  Patient understands the risk; will proceed with infusions.   # COPD-continue inhalers as per pulmonary.   # DISPOSITION: # Venoder today # chemo education  next week-; VEnofer # follow up 9th aug; labs- cbc/cmp;FOLFOX; [new] Venofer-Dr.B   All questions were answered. The patient knows to call the clinic with any problems, questions or concerns.    Cammie Sickle, MD 06/23/2020 10:20 PM

## 2020-06-23 NOTE — Progress Notes (Signed)
START ON PATHWAY REGIMEN - Colorectal     A cycle is every 14 days:     Oxaliplatin      Leucovorin      Fluorouracil      Fluorouracil   **Always confirm dose/schedule in your pharmacy ordering system**  Patient Characteristics: Postoperative without Neoadjuvant Therapy (Pathologic Staging), Colon, Stage III, High Risk (pT4 or pN2) Tumor Location: Colon Therapeutic Status: Postoperative without Neoadjuvant Therapy (Pathologic Staging) AJCC M Category: cM0 AJCC T Category: pT4 AJCC N Category: pN2 AJCC 8 Stage Grouping: IIIC Intent of Therapy: Curative Intent, Discussed with Patient

## 2020-06-24 ENCOUNTER — Other Ambulatory Visit: Payer: Self-pay

## 2020-06-24 ENCOUNTER — Inpatient Hospital Stay (HOSPITAL_BASED_OUTPATIENT_CLINIC_OR_DEPARTMENT_OTHER): Payer: Medicare HMO | Admitting: Oncology

## 2020-06-24 ENCOUNTER — Inpatient Hospital Stay: Payer: Medicare HMO | Attending: Internal Medicine

## 2020-06-24 DIAGNOSIS — R630 Anorexia: Secondary | ICD-10-CM | POA: Insufficient documentation

## 2020-06-24 DIAGNOSIS — E860 Dehydration: Secondary | ICD-10-CM | POA: Insufficient documentation

## 2020-06-24 DIAGNOSIS — Z452 Encounter for adjustment and management of vascular access device: Secondary | ICD-10-CM | POA: Insufficient documentation

## 2020-06-24 DIAGNOSIS — C180 Malignant neoplasm of cecum: Secondary | ICD-10-CM | POA: Insufficient documentation

## 2020-06-24 DIAGNOSIS — E876 Hypokalemia: Secondary | ICD-10-CM | POA: Insufficient documentation

## 2020-06-24 DIAGNOSIS — I959 Hypotension, unspecified: Secondary | ICD-10-CM | POA: Insufficient documentation

## 2020-06-24 DIAGNOSIS — C182 Malignant neoplasm of ascending colon: Secondary | ICD-10-CM | POA: Diagnosis not present

## 2020-06-24 DIAGNOSIS — J449 Chronic obstructive pulmonary disease, unspecified: Secondary | ICD-10-CM | POA: Insufficient documentation

## 2020-06-24 DIAGNOSIS — E611 Iron deficiency: Secondary | ICD-10-CM | POA: Diagnosis not present

## 2020-06-24 DIAGNOSIS — D509 Iron deficiency anemia, unspecified: Secondary | ICD-10-CM | POA: Insufficient documentation

## 2020-06-24 DIAGNOSIS — Z5111 Encounter for antineoplastic chemotherapy: Secondary | ICD-10-CM | POA: Insufficient documentation

## 2020-06-24 NOTE — Progress Notes (Signed)
[place]  [phone] [phone]  Patient Care Team: Albina Billet, MD as PCP - General (Internal Medicine) Clent Jacks, RN as Oncology Nurse Navigator Cammie Sickle, MD as Consulting Physician (Hematology and Oncology)   Name of the patient: James Hodge  [id number]  1946/08/16   Date of visit: 06/24/20  Diagnosis-colon cancer  Chief complaint/Reason for visit- Initial Meeting for [facility], preparing for starting chemotherapy  Heme/Onc history:  Oncology History Overview Note  # July 2021- RIGHT COLON ADENO CARCINOMA- Dr.Byrnett s/p right hemicolectomy- pT4b N2b- STAGE III [high risk]; MSI-STABLE.    - INVASIVE MODERATELY DIFFERENTIATED ADENOCARCINOMA.  - TEN OF FIFTY-TWO LYMPH NODES POSITIVE FOR METASTATIC ADENOCARCINOMA  (10/52).  - SINGLE INCIDENTAL TUBULOVILLOUS ADENOMA.  - THREE INCIDENTAL TUBULAR ADENOMAS.  - SEE CANCER SUMMARY.   CANCER CASE SUMMARY: COLON AND RECTUM  Procedure: Right hemicolectomy  Tumor Site: Cecum  Tumor Size: Greatest dimension: 12.2 cm  Macroscopic Tumor Perforation: Present  Histologic Type: Adenocarcinoma  Histologic Grade: G2: Moderately differentiated  Tumor Extension: Tumor directly invades adjacent structures (terminal  ileum)  Margins: All margins are uninvolved by invasive carcinoma, high-grade  dysplasia/intramucosal adenocarcinoma, and low-grade dysplasia  Treatment Effect: No known presurgical therapy  Lymphovascular Invasion: Present  Perineural Invasion: Present  Tumor Deposits: Present: Multiple  Regional Lymph Nodes:       Number of Lymph Nodes Involved: 10       Number of Lymph Nodes Examined: 54   # COPD- [Dr.Fleming];   # SURVIVORSHIP:   # GENETICS:   DIAGNOSIS:   STAGE:         ;  GOALS:  CURRENT/MOST RECENT THERAPY :     Cancer of right colon ([place])  05/20/2020 Initial Diagnosis   Cancer of right colon  ([place])   06/24/2020 -  Chemotherapy   The patient had DEXAMETHASONE 4 MG PO TABS, 8 mg, Oral, Daily, 0 of 1 cycle, Start date: --, End date: -- PALONOSETRON HCL INJECTION 0.25 MG/5ML, 0.25 mg, Intravenous,  Once, 0 of 12 cycles leucovorin 904 mg in dextrose 5 % 250 mL infusion, 400 mg/m2, Intravenous,  Once, 0 of 12 cycles oxaliplatin (ELOXATIN) 190 mg in dextrose 5 % 500 mL chemo infusion, 85 mg/m2, Intravenous,  Once, 0 of 12 cycles fluorouracil (ADRUCIL) chemo injection 900 mg, 400 mg/m2, Intravenous,  Once, 0 of 12 cycles fluorouracil (ADRUCIL) 5,400 mg in sodium chloride 0.9 % 142 mL chemo infusion, 2,400 mg/m2, Intravenous, 1 Day/Dose, 0 of 12 cycles  for chemotherapy treatment.      Interval history-James Hodge is a 74 year old male who presents to chemo care clinic today for initial meeting in preparation for starting chemotherapy. I introduced the chemo care clinic and we discussed that the role of the clinic is to assist those who are at an increased risk of emergency room visits and/or complications during the course of chemotherapy treatment. We discussed that the increased risk takes into account factors such as age, performance status, and co-morbidities. We also discussed that for some, this might include barriers to care such as not having a primary care provider, lack of insurance/transportation, or not being able to afford medications. We discussed that the goal of the program is to help prevent unplanned ER visits and help reduce complications during chemotherapy. We do this by discussing specific risk factors to each individual and identifying ways that we can help improve these risk factors and reduce barriers to  care.   ECOG FS:1 - Symptomatic but completely ambulatory  Review of systems- Review of Systems  Constitutional: Positive for malaise/fatigue and weight loss. Negative for chills and fever.  HENT: Negative for congestion, ear pain and tinnitus.   Eyes: Negative.   Negative for blurred vision and double vision.  Respiratory: Positive for cough and shortness of breath. Negative for sputum production.   Cardiovascular: Negative.  Negative for chest pain, palpitations and leg swelling.  Gastrointestinal: Negative.  Negative for abdominal pain, constipation, diarrhea, nausea and vomiting.  Genitourinary: Negative for dysuria, frequency and urgency.  Musculoskeletal: Negative for back pain and falls.  Skin: Negative.  Negative for rash.  Neurological: Positive for weakness. Negative for headaches.  Endo/Heme/Allergies: Negative.  Does not bruise/bleed easily.  Psychiatric/Behavioral: Negative.  Negative for depression. The patient is not nervous/anxious and does not have insomnia.      Current treatment-FOLFOX  No Known Allergies  Past Medical History:  Diagnosis Date  . Cancer ([place])   . COPD (chronic obstructive pulmonary disease) ([place])   . Hypertension     Past Surgical History:  Procedure Laterality Date  . CHOLECYSTECTOMY     Around 2001  . COLONOSCOPY WITH PROPOFOL N/A 04/12/2020   Procedure: COLONOSCOPY WITH PROPOFOL;  Surgeon: Robert Bellow, MD;  Location: [facility] ENDOSCOPY;  Service: Endoscopy;  Laterality: N/A;  . COLONOSCOPY WITH PROPOFOL N/A 04/24/2020   Procedure: COLONOSCOPY WITH PROPOFOL;  Surgeon: Robert Bellow, MD;  Location: [facility] ENDOSCOPY;  Service: Endoscopy;  Laterality: N/A;  . ESOPHAGOGASTRODUODENOSCOPY (EGD) WITH PROPOFOL N/A 04/12/2020   Procedure: ESOPHAGOGASTRODUODENOSCOPY (EGD) WITH PROPOFOL;  Surgeon: Robert Bellow, MD;  Location: [facility] ENDOSCOPY;  Service: Endoscopy;  Laterality: N/A;  . EYE SURGERY Bilateral    cataract  . LAPAROSCOPIC PARTIAL COLECTOMY Right 05/20/2020   Procedure: LAPAROSCOPIC PARTIAL COLECTOMY;  Surgeon: Robert Bellow, MD;  Location: [facility] ORS;  Service: General;  Laterality: Right;  Floyce Stakes, RNFA to assist  . PORTACATH PLACEMENT Left 06/19/2020   Procedure: INSERTION  PORT-A-CATH;  Surgeon: Robert Bellow, MD;  Location: [facility] ORS;  Service: General;  Laterality: Left;    Social History   Socioeconomic History  . Marital status: Married    Spouse name: Not on file  . Number of children: Not on file  . Years of education: Not on file  . Highest education level: Not on file  Occupational History  . Not on file  Tobacco Use  . Smoking status: Former Smoker    Years: 3.00    Types: Cigarettes    Quit date: 11/23/1968    Years since quitting: 51.6  . Smokeless tobacco: Never Used  Vaping Use  . Vaping Use: Never used  Substance and Sexual Activity  . Alcohol use: Not Currently  . Drug use: Never  . Sexual activity: Not on file  Other Topics Concern  . Not on file  Social History Narrative   Lives in [place]; smoked- quit in 70s; beer social; 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; grandson    Social Determinants of Health   Financial Resource Strain:   . Difficulty of Paying Living Expenses:   Food Insecurity:   . Worried About Charity fundraiser in the Last Year:   . Arboriculturist in the Last Year:   Transportation Needs:   . Film/video editor (Medical):   Marland Kitchen Lack of Transportation (Non-Medical):   Physical Activity:   . Days of Exercise per Week:   . Minutes of  Exercise per Session:   Stress:   . Feeling of Stress :   Social Connections:   . Frequency of Communication with Friends and Family:   . Frequency of Social Gatherings with Friends and Family:   . Attends Religious Services:   . Active Member of Clubs or Organizations:   . Attends Archivist Meetings:   Marland Kitchen Marital Status:   Intimate Partner Violence:   . Fear of Current or Ex-Partner:   . Emotionally Abused:   Marland Kitchen Physically Abused:   . Sexually Abused:     Family History  Problem Relation Age of Onset  . Multiple sclerosis Brother      Current Outpatient Medications:  .  BREO ELLIPTA 100-25 MCG/INH AEPB, Inhale 1 puff into the lungs at bedtime., Disp:  , Rfl:  .  lidocaine-prilocaine (EMLA) cream, Apply 1 application topically as needed. (Patient not taking: Reported on 06/20/2020), Disp: 30 g, Rfl: 3 .  ondansetron (ZOFRAN) 8 MG tablet, One pill every 8 hours as needed for nausea/vomitting., Disp: 40 tablet, Rfl: 1 .  prochlorperazine (COMPAZINE) 10 MG tablet, Take 1 tablet (10 mg total) by mouth every 6 (six) hours as needed for nausea or vomiting., Disp: 40 tablet, Rfl: 1 .  tiotropium (SPIRIVA) 18 MCG inhalation capsule, Place 18 mcg into inhaler and inhale at bedtime. , Disp: , Rfl:   Physical exam: There were no vitals filed for this visit. Physical Exam Constitutional:      Appearance: Normal appearance.  HENT:     Head: Normocephalic and atraumatic.  Eyes:     Pupils: Pupils are equal, round, and reactive to light.  Cardiovascular:     Rate and Rhythm: Normal rate and regular rhythm.     Heart sounds: Normal heart sounds. No murmur heard.   Pulmonary:     Effort: Pulmonary effort is normal.     Breath sounds: Normal breath sounds. No wheezing.  Abdominal:     General: Bowel sounds are normal. There is no distension.     Palpations: Abdomen is soft.     Tenderness: There is no abdominal tenderness.  Musculoskeletal:        General: Normal range of motion.     Cervical back: Normal range of motion.  Skin:    General: Skin is warm and dry.     Findings: No rash.  Neurological:     Mental Status: He is alert and oriented to person, place, and time.  Psychiatric:        Judgment: Judgment normal.      CMP Latest Ref Rng & Units 06/11/2020  Glucose 70 - 99 mg/dL 117(H)  BUN 8 - 23 mg/dL 15  Creatinine 0.61 - 1.24 mg/dL 0.75  Sodium 135 - 145 mmol/L 141  Potassium 3.5 - 5.1 mmol/L 3.7  Chloride 98 - 111 mmol/L 110  CO2 22 - 32 mmol/L 25  Calcium 8.9 - 10.3 mg/dL 8.2(L)  Total Protein 6.5 - 8.1 g/dL 6.4(L)  Total Bilirubin 0.3 - 1.2 mg/dL 0.9  Alkaline Phos 38 - 126 U/L 51  AST 15 - 41 U/L 22  ALT 0 - 44 U/L 18    CBC Latest Ref Rng & Units 06/11/2020  WBC 4.0 - 10.5 K/uL 10.1  Hemoglobin 13.0 - 17.0 g/dL 7.8(L)  Hematocrit 39 - 52 % 26.3(L)  Platelets 150 - 400 K/uL 434(H)    No images are attached to the encounter.  NM PET Image Initial (PI) Skull Base To Thigh  Result Date: 06/19/2020 CLINICAL DATA:  Initial treatment strategy for right-sided colon cancer. Colon resection 05/26/2020. No COVID vaccinations. COPD. EXAM: NUCLEAR MEDICINE PET SKULL BASE TO THIGH TECHNIQUE: 11.3 mCi F-18 FDG was injected intravenously. Full-ring PET imaging was performed from the skull base to thigh after the radiotracer. CT data was obtained and used for attenuation correction and anatomic localization. Fasting blood glucose: 103 mg/dl COMPARISON:  05/06/2020 FINDINGS: Mediastinal blood pool activity: SUV max 2.4 Liver activity: SUV max NA NECK: No areas of abnormal hypermetabolism. Incidental CT findings: Bilateral carotid atherosclerosis. No cervical adenopathy. CHEST: No pulmonary parenchymal or thoracic nodal hypermetabolism. Incidental CT findings: Development of small, left larger than right pleural effusions since the prior abdominal CT. Aortic and lad coronary artery atherosclerosis. Mild cardiomegaly. A subpleural 4 mm right lower lobe pulmonary nodule on 100/3 is most likely a subpleural lymph node. Mild centrilobular emphysema. ABDOMEN/PELVIS: Midline abdominal wall hypermetabolism is likely related to prior laparotomy. Right hemicolectomy. The previously described pericolonic adenopathy has presumably also been resected. No abdominopelvic nodal or parenchymal hypermetabolism. Incidental CT findings: Normal adrenal glands. Large colonic stool burden. Abdominal aortic atherosclerosis. Circumaortic left renal vein. Mild prostatomegaly. SKELETON: No osseous hypermetabolism. A focus of hypermetabolism about the medial left upper extremity, at the junction of the muscular and subcutaneous tissues is without CT correlate.  Measures a S.U.V. max of 17.5, including on approximately image 42/3. Incidental CT findings: none IMPRESSION: 1. Status post interval right hemicolectomy without typical findings of hypermetabolic residual or metastatic disease. 2. Focus of hypermetabolism within the medial left upper extremity. Given the lack of definite CT correlate, and the fact the patient was administered tracer in the left antecubital fossa, this is favored to be related to tracer accumulation. Felt unlikely to represent metastatic disease. This could be either be re-evaluated on follow-up PET, or if more entire characterization is desired, a left upper extremity pre and post-contrast MRI performed. 3. New bilateral pleural effusions. 4. Incidental findings, including: Aortic Atherosclerosis (ICD10-I70.0). Possible constipation. Emphysema (ICD10-J43.9). Coronary artery atherosclerosis. Electronically Signed   By: Abigail Miyamoto M.D.   On: 06/19/2020 14:04   DG Chest Port 1 View  Result Date: 06/19/2020 CLINICAL DATA:  Port-A-Cath placement EXAM: PORTABLE CHEST 1 VIEW COMPARISON:  None. FINDINGS: Power port placed from a left subclavian approach. Catheter tip in the SVC 1 cm above the right atrium. No pneumothorax. Lungs are clear. IMPRESSION: Power port tip in the SVC 1 cm above the right atrium. No pneumothorax. No active disease. Electronically Signed   By: Nelson Chimes M.D.   On: 06/19/2020 14:16   DG C-Arm 1-60 Min-No Report  Result Date: 06/19/2020 Fluoroscopy was utilized by the requesting physician.  No radiographic interpretation.   Assessment and plan- Patient is a 74 y.o. male who presents to [facility] for initial meeting in preparation for starting chemotherapy for the treatment of colon cancer.   1. HPI: Mr. Redmann is a 74 year old male with past medical history significant for tobacco abuse who was recently diagnosed with colon cancer discovered during colonoscopy.  Had right hemicolectomy by Dr. Tollie Pizza on  05/26/2020.  There was extensive nodal involvement on CT imaging.  Additional work-up included a PET scan and a port was placed.  PET scan did not reveal evidence of distant site metastasis.  He is scheduled to begin FOLFOX chemotherapy for total of 6 months on 07/02/2020.   2. Chemo Care Clinic/High Risk for ER/Hospitalization during chemotherapy- We discussed the role of the chemo care  clinic and identified patient specific risk factors. I discussed that patient was identified as high risk primarily based on:  Age and stage of disease.  Patient has past medical history positive for: Past Medical History:  Diagnosis Date  . Cancer ([place])   . COPD (chronic obstructive pulmonary disease) ([place])   . Hypertension     Patient has past surgical history positive for: Past Surgical History:  Procedure Laterality Date  . CHOLECYSTECTOMY     Around 2001  . COLONOSCOPY WITH PROPOFOL N/A 04/12/2020   Procedure: COLONOSCOPY WITH PROPOFOL;  Surgeon: Robert Bellow, MD;  Location: [facility] ENDOSCOPY;  Service: Endoscopy;  Laterality: N/A;  . COLONOSCOPY WITH PROPOFOL N/A 04/24/2020   Procedure: COLONOSCOPY WITH PROPOFOL;  Surgeon: Robert Bellow, MD;  Location: [facility] END[facility];  Service: Endoscopy;  Laterality: N/A;  . ESOPHAGOGASTRODUODENOSCOPY (EGD) WITH PROPOFOL N/A 04/12/2020   Procedure: ESOPHAGOGASTRODUODENOSCOPY (EGD) WITH PROPOFOL;  Surgeon: Robert Bellow, MD;  Location: [facility] END[facility];  Service: Endoscopy;  Laterality: N/A;  . EYE SURGERY Bilateral    cataract  . LAPAROSCOPIC PARTIAL COLECTOMY Right 05/20/2020   Procedure: LAPAROSCOPIC PARTIAL COLECTOMY;  Surgeon: Robert Bellow, MD;  Location: [facility] ORS;  Service: General;  Laterality: Right;  Floyce Stakes, RNFA to assist  . PORTACATH PLACEMENT Left 06/19/2020   Procedure: INSERTION PORT-A-CATH;  Surgeon: Robert Bellow, MD;  Location: [facility] ORS;  Service: General;  Laterality: Left;    Based on our high risk symptom management  report; this patient has a high risk of ED utilization.  The percentage below indicates how "at risk "  this patient based on the factors in this table within one year.   General Risk Score: 5  Values used to calculate this score:   Points  Metrics      1        Age: 16      3        Hospital Admissions: 5      0        ED Visits: 0      1        Has Chronic Obstructive Pulmonary Disease: Yes      0        Has Diabetes: No      0        Has Congestive Heart Failure: No      0        Has liver disease: No      0        Has Depression: No      0        Current PCP: Albina Billet, MD      0        Has Medicaid: No   3. We discussed that social determinants of health may have significant impacts on health and outcomes for cancer patients.  Today we discussed specific social determinants of performance status, alcohol use, depression, financial needs, food insecurity, housing, interpersonal violence, social connections, stress, tobacco use, and transportation.    After lengthy discussion the following were identified as areas of need: None at this time  Outpatient services: We discussed options including home based and outpatient services, DME and care program. We discusssed that patients who participate in regular physical activity report fewer negative impacts of cancer and treatments and report less fatigue.   Financial Concerns: We discussed that living with cancer can create tremendous financial burden.  We discussed options for assistance. I asked that  if assistance is needed in affording medications or paying bills to please let us know so that we can provide assistance. We discussed options for food including social services, [employer] ($50 every 2 weeks) and onsite food pantry.  We will also notify [facility] to see if cancer center can provide additional support.  Referral to Social work: Introduced Education officer, museum Elease Etienne and the services he can provide such as support  with [employer], cell phone and gas vouchers.   Support groups: We discussed options for support groups at the cancer center. If interested, please notify nurse navigator to enroll. We discussed options for managing stress including healthy eating, exercise as well as participating in no charge counseling services at the cancer center and support groups.  If these are of interest, patient can notify either myself or primary nursing team.We discussed options for management including medications and referral to quit Smart program  Transportation: We discussed options for transportation including acta, paratransit, bus routes, link transit, taxi/uber/lyft, and cancer center [place].  I have notified primary oncology team who will help assist with arranging Lucianne Lei transportation for appointments when/if needed. We also discussed options for transportation on short notice/acute visits.  Palliative care services: We have palliative care services available in the cancer center to discuss goals of care and advanced care planning.  Please let us know if you have any questions or would like to speak to our palliative nurse practitioner.  Symptom Management Clinic: We discussed our symptom management clinic which is available for acute concerns while receiving treatment such as nausea, vomiting or diarrhea.  We can be reached via telephone at [phone] or through my chart.  We are available for virtual or in person visits on the same day from 830 to 4 PM Monday through Friday. He denies needing specific assistance at this time and He will be followed by Dr. Rogue Bussing clinical team.  Plan: Discussed symptom management clinic. Discussed palliative care services. Discussed resources that are available here at the cancer center. Discussed medications and new prescriptions to begin treatment such as anti-nausea or steroids.   Disposition: RTC on 07/02/2020 for labs, md assessment prior to Cycle 1 FOLFOX.  Visit  Diagnosis 1. Cancer of right colon ([place])   2. Iron deficiency     Patient expressed understanding and was in agreement with this plan. He also understands that He can call clinic at any time with any questions, concerns, or complaints.   Greater than 50% was spent in counseling and coordination of care with this patient including but not limited to discussion of the relevant topics above (See A&P) including, but not limited to diagnosis and management of acute and chronic medical conditions.   [facility]  CC: Dr. Rogue Bussing

## 2020-06-25 NOTE — Progress Notes (Signed)
Pharmacist Chemotherapy Monitoring - Initial Assessment    Anticipated start date: 07/02/20  Regimen:  . Are orders appropriate based on the patient's diagnosis, regimen, and cycle? Yes . Does the plan date match the patient's scheduled date? Yes . Is the sequencing of drugs appropriate? Yes . Are the premedications appropriate for the patient's regimen? Yes . Prior Authorization for treatment is: Pending o If applicable, is the correct biosimilar selected based on the patient's insurance? not applicable  Organ Function and Labs: Marland Kitchen Are dose adjustments needed based on the patient's renal function, hepatic function, or hematologic function? No . Are appropriate labs ordered prior to the start of patient's treatment? Yes . Other organ system assessment, if indicated: N/A . The following baseline labs, if indicated, have been ordered: N/A  Dose Assessment: . Are the drug doses appropriate? Yes . Are the following correct: o Drug concentrations Yes o IV fluid compatible with drug Yes o Administration routes Yes o Timing of therapy Yes . If applicable, does the patient have documented access for treatment and/or plans for port-a-cath placement? yes . If applicable, have lifetime cumulative doses been properly documented and assessed? no Lifetime Dose Tracking  No doses have been documented on this patient for the following tracked chemicals: Doxorubicin, Epirubicin, Idarubicin, Daunorubicin, Mitoxantrone, Bleomycin, Oxaliplatin, Carboplatin, Liposomal Doxorubicin  o   Toxicity Monitoring/Prevention: . The patient has the following take home antiemetics prescribed: Ondansetron, Prochlorperazine, Dexamethasone and Lorazepam . The patient has the following take home medications prescribed: N/A . Medication allergies and previous infusion related reactions, if applicable, have been reviewed and addressed. Yes . The patient's current medication list has been assessed for drug-drug  interactions with their chemotherapy regimen. no significant drug-drug interactions were identified on review.  Order Review: . Are the treatment plan orders signed? No . Is the patient scheduled to see a provider prior to their treatment? Yes  I verify that I have reviewed each item in the above checklist and answered each question accordingly.  Adelina Mings 06/25/2020 8:54 AM

## 2020-07-01 ENCOUNTER — Ambulatory Visit: Payer: Medicare HMO | Admitting: Internal Medicine

## 2020-07-01 ENCOUNTER — Other Ambulatory Visit: Payer: Medicare HMO

## 2020-07-01 ENCOUNTER — Ambulatory Visit: Payer: Medicare HMO

## 2020-07-02 ENCOUNTER — Other Ambulatory Visit: Payer: Self-pay

## 2020-07-02 ENCOUNTER — Inpatient Hospital Stay (HOSPITAL_BASED_OUTPATIENT_CLINIC_OR_DEPARTMENT_OTHER): Payer: Medicare HMO | Admitting: Internal Medicine

## 2020-07-02 ENCOUNTER — Inpatient Hospital Stay: Payer: Medicare HMO

## 2020-07-02 ENCOUNTER — Encounter: Payer: Self-pay | Admitting: Internal Medicine

## 2020-07-02 DIAGNOSIS — J449 Chronic obstructive pulmonary disease, unspecified: Secondary | ICD-10-CM | POA: Diagnosis not present

## 2020-07-02 DIAGNOSIS — C182 Malignant neoplasm of ascending colon: Secondary | ICD-10-CM

## 2020-07-02 DIAGNOSIS — E876 Hypokalemia: Secondary | ICD-10-CM | POA: Diagnosis not present

## 2020-07-02 DIAGNOSIS — E611 Iron deficiency: Secondary | ICD-10-CM

## 2020-07-02 DIAGNOSIS — Z452 Encounter for adjustment and management of vascular access device: Secondary | ICD-10-CM | POA: Diagnosis not present

## 2020-07-02 DIAGNOSIS — Z5111 Encounter for antineoplastic chemotherapy: Secondary | ICD-10-CM | POA: Diagnosis present

## 2020-07-02 DIAGNOSIS — R630 Anorexia: Secondary | ICD-10-CM | POA: Diagnosis not present

## 2020-07-02 DIAGNOSIS — E860 Dehydration: Secondary | ICD-10-CM | POA: Diagnosis not present

## 2020-07-02 DIAGNOSIS — C180 Malignant neoplasm of cecum: Secondary | ICD-10-CM | POA: Diagnosis present

## 2020-07-02 DIAGNOSIS — D509 Iron deficiency anemia, unspecified: Secondary | ICD-10-CM | POA: Diagnosis not present

## 2020-07-02 DIAGNOSIS — I959 Hypotension, unspecified: Secondary | ICD-10-CM | POA: Diagnosis not present

## 2020-07-02 DIAGNOSIS — Z7189 Other specified counseling: Secondary | ICD-10-CM

## 2020-07-02 LAB — CBC WITH DIFFERENTIAL/PLATELET
Abs Immature Granulocytes: 0.03 10*3/uL (ref 0.00–0.07)
Basophils Absolute: 0.1 10*3/uL (ref 0.0–0.1)
Basophils Relative: 1 %
Eosinophils Absolute: 0.2 10*3/uL (ref 0.0–0.5)
Eosinophils Relative: 2 %
HCT: 28.9 % — ABNORMAL LOW (ref 39.0–52.0)
Hemoglobin: 8.2 g/dL — ABNORMAL LOW (ref 13.0–17.0)
Immature Granulocytes: 0 %
Lymphocytes Relative: 28 %
Lymphs Abs: 2.4 10*3/uL (ref 0.7–4.0)
MCH: 21.8 pg — ABNORMAL LOW (ref 26.0–34.0)
MCHC: 28.4 g/dL — ABNORMAL LOW (ref 30.0–36.0)
MCV: 76.9 fL — ABNORMAL LOW (ref 80.0–100.0)
Monocytes Absolute: 1 10*3/uL (ref 0.1–1.0)
Monocytes Relative: 11 %
Neutro Abs: 5 10*3/uL (ref 1.7–7.7)
Neutrophils Relative %: 58 %
Platelets: 310 10*3/uL (ref 150–400)
RBC: 3.76 MIL/uL — ABNORMAL LOW (ref 4.22–5.81)
RDW: 24.7 % — ABNORMAL HIGH (ref 11.5–15.5)
WBC: 8.7 10*3/uL (ref 4.0–10.5)
nRBC: 0 % (ref 0.0–0.2)

## 2020-07-02 LAB — COMPREHENSIVE METABOLIC PANEL
ALT: 12 U/L (ref 0–44)
AST: 19 U/L (ref 15–41)
Albumin: 3.4 g/dL — ABNORMAL LOW (ref 3.5–5.0)
Alkaline Phosphatase: 47 U/L (ref 38–126)
Anion gap: 8 (ref 5–15)
BUN: 14 mg/dL (ref 8–23)
CO2: 26 mmol/L (ref 22–32)
Calcium: 8.8 mg/dL — ABNORMAL LOW (ref 8.9–10.3)
Chloride: 106 mmol/L (ref 98–111)
Creatinine, Ser: 0.8 mg/dL (ref 0.61–1.24)
GFR calc Af Amer: >60 mL/min (ref >60)
GFR calc non Af Amer: >60 mL/min (ref >60)
Glucose, Bld: 113 mg/dL — ABNORMAL HIGH (ref 70–99)
Potassium: 3.8 mmol/L (ref 3.5–5.1)
Sodium: 140 mmol/L (ref 135–145)
Total Bilirubin: 1.3 mg/dL — ABNORMAL HIGH (ref 0.3–1.2)
Total Protein: 6.9 g/dL (ref 6.5–8.1)

## 2020-07-02 MED ORDER — SODIUM CHLORIDE 0.9 % IV SOLN
10.0000 mg | Freq: Once | INTRAVENOUS | Status: AC
  Administered 2020-07-02: 10 mg via INTRAVENOUS
  Filled 2020-07-02: qty 10

## 2020-07-02 MED ORDER — DEXTROSE 5 % IV SOLN
Freq: Once | INTRAVENOUS | Status: AC
  Filled 2020-07-02: qty 250

## 2020-07-02 MED ORDER — SODIUM CHLORIDE 0.9 % IV SOLN
2400.0000 mg/m2 | INTRAVENOUS | Status: DC
  Administered 2020-07-02: 5400 mg via INTRAVENOUS
  Filled 2020-07-02: qty 108

## 2020-07-02 MED ORDER — SODIUM CHLORIDE 0.9 % IV SOLN
Freq: Once | INTRAVENOUS | Status: DC
  Filled 2020-07-02: qty 250

## 2020-07-02 MED ORDER — LEUCOVORIN CALCIUM INJECTION 350 MG
400.0000 mg/m2 | Freq: Once | INTRAVENOUS | Status: AC
  Administered 2020-07-02: 900 mg via INTRAVENOUS
  Filled 2020-07-02: qty 45

## 2020-07-02 MED ORDER — PALONOSETRON HCL INJECTION 0.25 MG/5ML
0.2500 mg | Freq: Once | INTRAVENOUS | Status: AC
  Administered 2020-07-02: 0.25 mg via INTRAVENOUS
  Filled 2020-07-02: qty 5

## 2020-07-02 MED ORDER — LEUCOVORIN CALCIUM INJECTION 350 MG: 400.0000 mg/m2 | Freq: Once | INTRAVENOUS | Status: DC

## 2020-07-02 MED ORDER — SODIUM CHLORIDE 0.9 % IV SOLN: 200.0000 mg | Freq: Once | INTRAVENOUS | Status: DC

## 2020-07-02 MED ORDER — OXALIPLATIN CHEMO INJECTION 100 MG/20ML
85.0000 mg/m2 | Freq: Once | INTRAVENOUS | Status: AC
  Administered 2020-07-02: 190 mg via INTRAVENOUS
  Filled 2020-07-02: qty 38

## 2020-07-02 MED ORDER — IRON SUCROSE 20 MG/ML IV SOLN
200.0000 mg | Freq: Once | INTRAVENOUS | Status: AC
  Administered 2020-07-02: 200 mg via INTRAVENOUS
  Filled 2020-07-02: qty 10

## 2020-07-02 NOTE — Assessment & Plan Note (Addendum)
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  PET scan-no evidence of distant site disease.  FOLFOX adjuvant chemotherapy.  # proceed with FOLFOX cycle #1 today. Labs today reviewed;  acceptable for treatment today.  Again reviewed the potential side effects including but not limited to nausea vomiting diarrhea/neuropathy cold sensitivity.  Recommend getting Imodium at home  # Iron defciency anemia- proceed with venofer. Hb 8.3.  Overall stable.  # COPD- STABLE; continue inhalers as per pulmonary.  # DISPOSITION: # Venoder today; FOLFOX today # 1 week- labs- cbc/bmp; Venofer # follow up in 2 weeks- MD; labs- cbc/cmp;FOLFOX; Venofer-Dr.B

## 2020-07-02 NOTE — Progress Notes (Signed)
[facility] NOTE  Patient Care Team: Albina Billet, MD as PCP - General (Internal Medicine) Clent Jacks, RN as Oncology Nurse Navigator Cammie Sickle, MD as Consulting Physician (Hematology and Oncology)  CHIEF COMPLAINTS/PURPOSE OF CONSULTATION: Colon cancer  #  Oncology History Overview Note  # July 2021- RIGHT COLON ADENO CARCINOMA- Dr.Byrnett s/p right hemicolectomy- pT4b N2b- STAGE III [high risk]; MSI-STABLE.   # AUG 10th, 2021- FOLFOX [adjuvant]  -------------------------------------------------------------------------------------------   - INVASIVE MODERATELY DIFFERENTIATED ADENOCARCINOMA.  - TEN OF FIFTY-TWO LYMPH NODES POSITIVE FOR METASTATIC ADENOCARCINOMA  (10/52).  - SINGLE INCIDENTAL TUBULOVILLOUS ADENOMA.  - THREE INCIDENTAL TUBULAR ADENOMAS.  - SEE CANCER SUMMARY.   CANCER CASE SUMMARY: COLON AND RECTUM  Procedure: Right hemicolectomy  Tumor Site: Cecum  Tumor Size: Greatest dimension: 12.2 cm  Macroscopic Tumor Perforation: Present  Histologic Type: Adenocarcinoma  Histologic Grade: G2: Moderately differentiated  Tumor Extension: Tumor directly invades adjacent structures (terminal  ileum)  Margins: All margins are uninvolved by invasive carcinoma, high-grade  dysplasia/intramucosal adenocarcinoma, and low-grade dysplasia  Treatment Effect: No known presurgical therapy  Lymphovascular Invasion: Present  Perineural Invasion: Present  Tumor Deposits: Present: Multiple  Regional Lymph Nodes:       Number of Lymph Nodes Involved: 10       Number of Lymph Nodes Examined: 31  -  # COPD- [Dr.Fleming];   # SURVIVORSHIP:   # GENETICS:   DIAGNOSIS: colon cancer  STAGE:   III      ;  GOALS:cure  CURRENT/MOST RECENT THERAPY : FOLFOX     Cancer of right colon ([place])  05/20/2020 Initial Diagnosis   Cancer of right colon ([place])   07/02/2020 -  Chemotherapy   The patient had dexamethasone (DECADRON) 4 MG tablet, 8 mg,  Oral, Daily, 1 of 1 cycle, Start date: --, End date: -- palonosetron (ALOXI) injection 0.25 mg, 0.25 mg, Intravenous,  Once, 1 of 12 cycles leucovorin 904 mg in dextrose 5 % 250 mL infusion, 400 mg/m2 = 904 mg, Intravenous,  Once, 1 of 12 cycles oxaliplatin (ELOXATIN) 190 mg in dextrose 5 % 500 mL chemo infusion, 85 mg/m2 = 190 mg, Intravenous,  Once, 1 of 12 cycles fluorouracil (ADRUCIL) 5,400 mg in sodium chloride 0.9 % 142 mL chemo infusion, 2,400 mg/m2 = 5,400 mg, Intravenous, 1 Day/Dose, 1 of 12 cycles  for chemotherapy treatment.       HISTORY OF PRESENTING ILLNESS:  James Hodge 74 y.o.  male stage III colon cancer-high risk is here proceed with adjuvant FOLFOX chemotherapy.  Patient interim underwent IV iron infusion-notes to have improvement of his energy levels/fatigue.  Denies any more blood in stools or blood per stools.  His appetite is good.    Review of Systems  Constitutional: Positive for malaise/fatigue and weight loss. Negative for chills, diaphoresis and fever.  HENT: Negative for nosebleeds and sore throat.   Eyes: Negative for double vision.  Respiratory: Positive for cough and shortness of breath. Negative for hemoptysis, sputum production and wheezing.   Cardiovascular: Negative for chest pain, palpitations, orthopnea and leg swelling.  Gastrointestinal: Negative for abdominal pain, blood in stool, constipation, diarrhea, heartburn, melena, nausea and vomiting.  Genitourinary: Negative for dysuria, frequency and urgency.  Musculoskeletal: Negative for back pain and joint pain.  Skin: Negative.  Negative for itching and rash.  Neurological: Positive for tingling. Negative for dizziness, focal weakness, weakness and headaches.  Endo/Heme/Allergies: Does not bruise/bleed easily.  Psychiatric/Behavioral: Negative for depression. The patient  is not nervous/anxious and does not have insomnia.      MEDICAL HISTORY:  Past Medical History:  Diagnosis Date  . Cancer  ([place])   . COPD (chronic obstructive pulmonary disease) ([place])   . Hypertension     SURGICAL HISTORY: Past Surgical History:  Procedure Laterality Date  . CHOLECYSTECTOMY     Around 2001  . COLONOSCOPY WITH PROPOFOL N/A 04/12/2020   Procedure: COLONOSCOPY WITH PROPOFOL;  Surgeon: Robert Bellow, MD;  Location: [facility] ENDOSCOPY;  Service: Endoscopy;  Laterality: N/A;  . COLONOSCOPY WITH PROPOFOL N/A 04/24/2020   Procedure: COLONOSCOPY WITH PROPOFOL;  Surgeon: Robert Bellow, MD;  Location: [facility] ENDOSCOPY;  Service: Endoscopy;  Laterality: N/A;  . ESOPHAGOGASTRODUODENOSCOPY (EGD) WITH PROPOFOL N/A 04/12/2020   Procedure: ESOPHAGOGASTRODUODENOSCOPY (EGD) WITH PROPOFOL;  Surgeon: Robert Bellow, MD;  Location: [facility] ENDOSCOPY;  Service: Endoscopy;  Laterality: N/A;  . EYE SURGERY Bilateral    cataract  . LAPAROSCOPIC PARTIAL COLECTOMY Right 05/20/2020   Procedure: LAPAROSCOPIC PARTIAL COLECTOMY;  Surgeon: Robert Bellow, MD;  Location: [facility] ORS;  Service: General;  Laterality: Right;  Floyce Stakes, RNFA to assist  . PORTACATH PLACEMENT Left 06/19/2020   Procedure: INSERTION PORT-A-CATH;  Surgeon: Robert Bellow, MD;  Location: [facility] ORS;  Service: General;  Laterality: Left;    SOCIAL HISTORY: Social History   Socioeconomic History  . Marital status: Married    Spouse name: Not on file  . Number of children: Not on file  . Years of education: Not on file  . Highest education level: Not on file  Occupational History  . Not on file  Tobacco Use  . Smoking status: Former Smoker    Years: 3.00    Types: Cigarettes    Quit date: 11/23/1968    Years since quitting: 51.6  . Smokeless tobacco: Never Used  Vaping Use  . Vaping Use: Never used  Substance and Sexual Activity  . Alcohol use: Not Currently  . Drug use: Never  . Sexual activity: Not on file  Other Topics Concern  . Not on file  Social History Narrative   Lives in [place]; smoked- quit in 70s; beer social;  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; grandson    Social Determinants of Health   Financial Resource Strain:   . Difficulty of Paying Living Expenses:   Food Insecurity:   . Worried About Charity fundraiser in the Last Year:   . Arboriculturist in the Last Year:   Transportation Needs:   . Film/video editor (Medical):   Marland Kitchen Lack of Transportation (Non-Medical):   Physical Activity:   . Days of Exercise per Week:   . Minutes of Exercise per Session:   Stress:   . Feeling of Stress :   Social Connections:   . Frequency of Communication with Friends and Family:   . Frequency of Social Gatherings with Friends and Family:   . Attends Religious Services:   . Active Member of Clubs or Organizations:   . Attends Archivist Meetings:   Marland Kitchen Marital Status:   Intimate Partner Violence:   . Fear of Current or Ex-Partner:   . Emotionally Abused:   Marland Kitchen Physically Abused:   . Sexually Abused:     FAMILY HISTORY: Family History  Problem Relation Age of Onset  . Multiple sclerosis Brother     ALLERGIES:  has No Known Allergies.  MEDICATIONS:  Current Outpatient Medications  Medication Sig Dispense Refill  . BREO ELLIPTA  100-25 MCG/INH AEPB Inhale 1 puff into the lungs at bedtime.    . lidocaine-prilocaine (EMLA) cream Apply 1 application topically as needed. 30 g 3  . ondansetron (ZOFRAN) 8 MG tablet One pill every 8 hours as needed for nausea/vomitting. 40 tablet 1  . prochlorperazine (COMPAZINE) 10 MG tablet Take 1 tablet (10 mg total) by mouth every 6 (six) hours as needed for nausea or vomiting. 40 tablet 1  . tiotropium (SPIRIVA) 18 MCG inhalation capsule Place 18 mcg into inhaler and inhale at bedtime.      No current facility-administered medications for this visit.   Facility-Administered Medications Ordered in Other Visits  Medication Dose Route Frequency Provider Last Rate Last Admin  . 0.9 %  sodium chloride infusion   Intravenous Once Charlaine Dalton R, MD      .  fluorouracil (ADRUCIL) 5,400 mg in sodium chloride 0.9 % 142 mL chemo infusion  2,400 mg/m2 (Treatment Plan Recorded) Intravenous 1 day or 1 dose Charlaine Dalton R, MD      . leucovorin 900 mg in dextrose 5 % 250 mL infusion  400 mg/m2 Intravenous Once Cammie Sickle, MD 148 mL/hr at 07/02/20 1049 900 mg at 07/02/20 1049  . oxaliplatin (ELOXATIN) 190 mg in dextrose 5 % 500 mL chemo infusion  85 mg/m2 (Treatment Plan Recorded) Intravenous Once Cammie Sickle, MD 269 mL/hr at 07/02/20 1051 190 mg at 07/02/20 1051      .  PHYSICAL EXAMINATION: ECOG PERFORMANCE STATUS: 1 - Symptomatic but completely ambulatory  Vitals:   07/02/20 0842  BP: 117/68  Pulse: 80  Resp: 16  Temp: 98.5 F (36.9 C)  SpO2: 98%   Filed Weights   07/02/20 0842  Weight: 210 lb (95.3 kg)    Physical Exam Constitutional:      Comments: Elderly male patient.  Patient is ambulating independently.  Accompanied by his son.  HENT:     Head: Normocephalic and atraumatic.     Mouth/Throat:     Pharynx: No oropharyngeal exudate.  Eyes:     Pupils: Pupils are equal, round, and reactive to light.  Cardiovascular:     Rate and Rhythm: Normal rate and regular rhythm.  Pulmonary:     Effort: No respiratory distress.     Breath sounds: No wheezing.     Comments: Decreased air entry bilaterally.  No wheeze or crackles Abdominal:     General: Bowel sounds are normal. There is no distension.     Palpations: Abdomen is soft. There is no mass.     Tenderness: There is no abdominal tenderness. There is no guarding or rebound.  Musculoskeletal:        General: No tenderness. Normal range of motion.     Cervical back: Normal range of motion and neck supple.  Skin:    General: Skin is warm.  Neurological:     Mental Status: He is alert and oriented to person, place, and time.  Psychiatric:        Mood and Affect: Affect normal.      LABORATORY DATA:  I have reviewed the data as listed Lab Results   Component Value Date   WBC 8.7 07/02/2020   HGB 8.2 (L) 07/02/2020   HCT 28.9 (L) 07/02/2020   MCV 76.9 (L) 07/02/2020   PLT 310 07/02/2020   Recent Labs    05/23/20 0503 06/11/20 1206 07/02/20 0813  NA 135 141 140  K 4.0 3.7 3.8  CL 101 110 106  CO2 _0 GLUCOSE 114* 117* 113*  BUN _1 CREATININE 0.78 0.75 0.80  CALCIUM 8.3* 8.2* 8.8*  GFRNONAA >60 >60 >60  GFRAA >60 >60 >60  PROT  --  6.4* 6.9  ALBUMIN  --  3.1* 3.4*  AST  --  22 19  ALT  --  18 12  ALKPHOS  --  51 47  BILITOT  --  0.9 1.3*    RADIOGRAPHIC STUDIES: I have personally reviewed the radiological images as listed and agreed with the findings in the report. NM PET Image Initial (PI) Skull Base To Thigh  Result Date: 06/19/2020 CLINICAL DATA:  Initial treatment strategy for right-sided colon cancer. Colon resection 05/26/2020. No COVID vaccinations. COPD. EXAM: NUCLEAR MEDICINE PET SKULL BASE TO THIGH TECHNIQUE: 11.3 mCi F-18 FDG was injected intravenously. Full-ring PET imaging was performed from the skull base to thigh after the radiotracer. CT data was obtained and used for attenuation correction and anatomic localization. Fasting blood glucose: 103 mg/dl COMPARISON:  05/06/2020 FINDINGS: Mediastinal blood pool activity: SUV max 2.4 Liver activity: SUV max NA NECK: No areas of abnormal hypermetabolism. Incidental CT findings: Bilateral carotid atherosclerosis. No cervical adenopathy. CHEST: No pulmonary parenchymal or thoracic nodal hypermetabolism. Incidental CT findings: Development of small, left larger than right pleural effusions since the prior abdominal CT. Aortic and lad coronary artery atherosclerosis. Mild cardiomegaly. A subpleural 4 mm right lower lobe pulmonary nodule on 100/3 is most likely a subpleural lymph node. Mild centrilobular emphysema. ABDOMEN/PELVIS: Midline abdominal wall hypermetabolism is likely related to prior laparotomy. Right hemicolectomy. The previously described  pericolonic adenopathy has presumably also been resected. No abdominopelvic nodal or parenchymal hypermetabolism. Incidental CT findings: Normal adrenal glands. Large colonic stool burden. Abdominal aortic atherosclerosis. Circumaortic left renal vein. Mild prostatomegaly. SKELETON: No osseous hypermetabolism. A focus of hypermetabolism about the medial left upper extremity, at the junction of the muscular and subcutaneous tissues is without CT correlate. Measures a S.U.V. max of 17.5, including on approximately image 42/3. Incidental CT findings: none IMPRESSION: 1. Status post interval right hemicolectomy without typical findings of hypermetabolic residual or metastatic disease. 2. Focus of hypermetabolism within the medial left upper extremity. Given the lack of definite CT correlate, and the fact the patient was administered tracer in the left antecubital fossa, this is favored to be related to tracer accumulation. Felt unlikely to represent metastatic disease. This could be either be re-evaluated on follow-up PET, or if more entire characterization is desired, a left upper extremity pre and post-contrast MRI performed. 3. New bilateral pleural effusions. 4. Incidental findings, including: Aortic Atherosclerosis (ICD10-I70.0). Possible constipation. Emphysema (ICD10-J43.9). Coronary artery atherosclerosis. Electronically Signed   By: Abigail Miyamoto M.D.   On: 06/19/2020 14:04   DG Chest Port 1 View  Result Date: 06/19/2020 CLINICAL DATA:  Port-A-Cath placement EXAM: PORTABLE CHEST 1 VIEW COMPARISON:  None. FINDINGS: Power port placed from a left subclavian approach. Catheter tip in the SVC 1 cm above the right atrium. No pneumothorax. Lungs are clear. IMPRESSION: Power port tip in the SVC 1 cm above the right atrium. No pneumothorax. No active disease. Electronically Signed   By: Nelson Chimes M.D.   On: 06/19/2020 14:16   DG C-Arm 1-60 Min-No Report  Result Date: 06/19/2020 Fluoroscopy was utilized by the  requesting physician.  No radiographic interpretation.    ASSESSMENT & PLAN:   Cancer of right colon ([place]) #Colon cancer stage III-PT4BN2B- high risk.  PET scan-no evidence of distant site  disease.  FOLFOX adjuvant chemotherapy.  # proceed with FOLFOX cycle #1 today. Labs today reviewed;  acceptable for treatment today.  Again reviewed the potential side effects including but not limited to nausea vomiting diarrhea/neuropathy cold sensitivity.  Recommend getting Imodium at home  # Iron defciency anemia- proceed with venofer. Hb 8.3.  Overall stable.  # COPD- STABLE; continue inhalers as per pulmonary.  # DISPOSITION: # Venoder today; FOLFOX today # 1 week- labs- cbc/bmp; Venofer # follow up in 2 weeks- MD; labs- cbc/cmp;FOLFOX; Venofer-Dr.B   All questions were answered. The patient knows to call the clinic with any problems, questions or concerns.    Cammie Sickle, MD 07/02/2020 11:31 AM

## 2020-07-03 ENCOUNTER — Telehealth: Payer: Self-pay

## 2020-07-03 NOTE — Telephone Encounter (Signed)
T/C to pt for follow up after receiving first chemo yesterday but no answer and did not switch over to voice mail.   Unable to leave message.

## 2020-07-04 ENCOUNTER — Other Ambulatory Visit: Payer: Self-pay

## 2020-07-04 ENCOUNTER — Inpatient Hospital Stay: Payer: Medicare HMO

## 2020-07-04 DIAGNOSIS — Z5111 Encounter for antineoplastic chemotherapy: Secondary | ICD-10-CM | POA: Diagnosis not present

## 2020-07-04 DIAGNOSIS — Z7189 Other specified counseling: Secondary | ICD-10-CM

## 2020-07-04 DIAGNOSIS — C182 Malignant neoplasm of ascending colon: Secondary | ICD-10-CM

## 2020-07-04 MED ORDER — SODIUM CHLORIDE 0.9% FLUSH
10.0000 mL | INTRAVENOUS | Status: DC | PRN
  Administered 2020-07-04: 10 mL
  Filled 2020-07-04: qty 10

## 2020-07-04 MED ORDER — HEPARIN SOD (PORK) LOCK FLUSH 100 UNIT/ML IV SOLN
500.0000 [IU] | Freq: Once | INTRAVENOUS | Status: AC | PRN
  Administered 2020-07-04: 500 [IU]
  Filled 2020-07-04: qty 5

## 2020-07-04 MED ORDER — HEPARIN SOD (PORK) LOCK FLUSH 100 UNIT/ML IV SOLN
INTRAVENOUS | Status: AC
  Filled 2020-07-04: qty 5

## 2020-07-09 ENCOUNTER — Inpatient Hospital Stay: Payer: Medicare HMO

## 2020-07-09 ENCOUNTER — Other Ambulatory Visit: Payer: Self-pay

## 2020-07-09 VITALS — BP 109/71 | HR 73 | Temp 97.0°F

## 2020-07-09 DIAGNOSIS — Z5111 Encounter for antineoplastic chemotherapy: Secondary | ICD-10-CM | POA: Diagnosis not present

## 2020-07-09 DIAGNOSIS — E611 Iron deficiency: Secondary | ICD-10-CM

## 2020-07-09 DIAGNOSIS — C182 Malignant neoplasm of ascending colon: Secondary | ICD-10-CM

## 2020-07-09 LAB — COMPREHENSIVE METABOLIC PANEL
ALT: 17 U/L (ref 0–44)
AST: 24 U/L (ref 15–41)
Albumin: 3.5 g/dL (ref 3.5–5.0)
Alkaline Phosphatase: 50 U/L (ref 38–126)
Anion gap: 6 (ref 5–15)
BUN: 14 mg/dL (ref 8–23)
CO2: 26 mmol/L (ref 22–32)
Calcium: 8.4 mg/dL — ABNORMAL LOW (ref 8.9–10.3)
Chloride: 106 mmol/L (ref 98–111)
Creatinine, Ser: 0.79 mg/dL (ref 0.61–1.24)
GFR calc Af Amer: >60 mL/min (ref >60)
GFR calc non Af Amer: >60 mL/min (ref >60)
Glucose, Bld: 99 mg/dL (ref 70–99)
Potassium: 3.2 mmol/L — ABNORMAL LOW (ref 3.5–5.1)
Sodium: 138 mmol/L (ref 135–145)
Total Bilirubin: 1.4 mg/dL — ABNORMAL HIGH (ref 0.3–1.2)
Total Protein: 6.7 g/dL (ref 6.5–8.1)

## 2020-07-09 LAB — CBC WITH DIFFERENTIAL/PLATELET
Abs Immature Granulocytes: 0.01 10*3/uL (ref 0.00–0.07)
Basophils Absolute: 0 10*3/uL (ref 0.0–0.1)
Basophils Relative: 1 %
Eosinophils Absolute: 0 10*3/uL (ref 0.0–0.5)
Eosinophils Relative: 1 %
HCT: 27.5 % — ABNORMAL LOW (ref 39.0–52.0)
Hemoglobin: 8.2 g/dL — ABNORMAL LOW (ref 13.0–17.0)
Immature Granulocytes: 0 %
Lymphocytes Relative: 27 %
Lymphs Abs: 1.6 10*3/uL (ref 0.7–4.0)
MCH: 22.4 pg — ABNORMAL LOW (ref 26.0–34.0)
MCHC: 29.8 g/dL — ABNORMAL LOW (ref 30.0–36.0)
MCV: 75.1 fL — ABNORMAL LOW (ref 80.0–100.0)
Monocytes Absolute: 0.9 10*3/uL (ref 0.1–1.0)
Monocytes Relative: 15 %
Neutro Abs: 3.3 10*3/uL (ref 1.7–7.7)
Neutrophils Relative %: 56 %
Platelets: 286 10*3/uL (ref 150–400)
RBC: 3.66 MIL/uL — ABNORMAL LOW (ref 4.22–5.81)
RDW: 23.7 % — ABNORMAL HIGH (ref 11.5–15.5)
WBC: 5.8 10*3/uL (ref 4.0–10.5)
nRBC: 0 % (ref 0.0–0.2)

## 2020-07-09 MED ORDER — SODIUM CHLORIDE 0.9 % IV SOLN: 200.0000 mg | Freq: Once | INTRAVENOUS | Status: DC

## 2020-07-09 MED ORDER — HEPARIN SOD (PORK) LOCK FLUSH 100 UNIT/ML IV SOLN
500.0000 [IU] | Freq: Once | INTRAVENOUS | Status: AC | PRN
  Administered 2020-07-09: 500 [IU]
  Filled 2020-07-09: qty 5

## 2020-07-09 MED ORDER — SODIUM CHLORIDE 0.9% FLUSH
10.0000 mL | Freq: Once | INTRAVENOUS | Status: AC
  Administered 2020-07-09: 10 mL via INTRAVENOUS
  Filled 2020-07-09: qty 10

## 2020-07-09 MED ORDER — IRON SUCROSE 20 MG/ML IV SOLN
200.0000 mg | Freq: Once | INTRAVENOUS | Status: AC
  Administered 2020-07-09: 200 mg via INTRAVENOUS
  Filled 2020-07-09: qty 10

## 2020-07-09 MED ORDER — HEPARIN SOD (PORK) LOCK FLUSH 100 UNIT/ML IV SOLN
INTRAVENOUS | Status: AC
  Filled 2020-07-09: qty 5

## 2020-07-09 MED ORDER — SODIUM CHLORIDE 0.9 % IV SOLN
Freq: Once | INTRAVENOUS | Status: AC
  Filled 2020-07-09: qty 250

## 2020-07-16 ENCOUNTER — Inpatient Hospital Stay (HOSPITAL_BASED_OUTPATIENT_CLINIC_OR_DEPARTMENT_OTHER): Payer: Medicare HMO | Admitting: Internal Medicine

## 2020-07-16 ENCOUNTER — Other Ambulatory Visit: Payer: Self-pay

## 2020-07-16 ENCOUNTER — Inpatient Hospital Stay: Payer: Medicare HMO

## 2020-07-16 ENCOUNTER — Telehealth: Payer: Self-pay

## 2020-07-16 ENCOUNTER — Encounter: Payer: Self-pay | Admitting: Internal Medicine

## 2020-07-16 VITALS — BP 76/50 | HR 91 | Temp 98.2°F | Resp 18 | Ht 75.0 in | Wt 203.8 lb

## 2020-07-16 VITALS — BP 111/64 | HR 60 | Resp 18

## 2020-07-16 DIAGNOSIS — I959 Hypotension, unspecified: Secondary | ICD-10-CM

## 2020-07-16 DIAGNOSIS — E876 Hypokalemia: Secondary | ICD-10-CM

## 2020-07-16 DIAGNOSIS — C182 Malignant neoplasm of ascending colon: Secondary | ICD-10-CM

## 2020-07-16 DIAGNOSIS — Z5111 Encounter for antineoplastic chemotherapy: Secondary | ICD-10-CM | POA: Diagnosis not present

## 2020-07-16 DIAGNOSIS — E860 Dehydration: Secondary | ICD-10-CM

## 2020-07-16 DIAGNOSIS — Z7189 Other specified counseling: Secondary | ICD-10-CM

## 2020-07-16 LAB — COMPREHENSIVE METABOLIC PANEL
ALT: 16 U/L (ref 0–44)
AST: 33 U/L (ref 15–41)
Albumin: 3.2 g/dL — ABNORMAL LOW (ref 3.5–5.0)
Alkaline Phosphatase: 61 U/L (ref 38–126)
Anion gap: 10 (ref 5–15)
BUN: 16 mg/dL (ref 8–23)
CO2: 24 mmol/L (ref 22–32)
Calcium: 8 mg/dL — ABNORMAL LOW (ref 8.9–10.3)
Chloride: 105 mmol/L (ref 98–111)
Creatinine, Ser: 0.88 mg/dL (ref 0.61–1.24)
GFR calc Af Amer: >60 mL/min (ref >60)
GFR calc non Af Amer: >60 mL/min (ref >60)
Glucose, Bld: 129 mg/dL — ABNORMAL HIGH (ref 70–99)
Potassium: 2.7 mmol/L — CL (ref 3.5–5.1)
Sodium: 139 mmol/L (ref 135–145)
Total Bilirubin: 1.1 mg/dL (ref 0.3–1.2)
Total Protein: 6.6 g/dL (ref 6.5–8.1)

## 2020-07-16 LAB — CBC WITH DIFFERENTIAL/PLATELET
Abs Immature Granulocytes: 0.02 10*3/uL (ref 0.00–0.07)
Basophils Absolute: 0 10*3/uL (ref 0.0–0.1)
Basophils Relative: 0 %
Eosinophils Absolute: 0 10*3/uL (ref 0.0–0.5)
Eosinophils Relative: 0 %
HCT: 33.1 % — ABNORMAL LOW (ref 39.0–52.0)
Hemoglobin: 9.8 g/dL — ABNORMAL LOW (ref 13.0–17.0)
Immature Granulocytes: 0 %
Lymphocytes Relative: 18 %
Lymphs Abs: 0.9 10*3/uL (ref 0.7–4.0)
MCH: 23.2 pg — ABNORMAL LOW (ref 26.0–34.0)
MCHC: 29.6 g/dL — ABNORMAL LOW (ref 30.0–36.0)
MCV: 78.4 fL — ABNORMAL LOW (ref 80.0–100.0)
Monocytes Absolute: 0.5 10*3/uL (ref 0.1–1.0)
Monocytes Relative: 11 %
Neutro Abs: 3.6 10*3/uL (ref 1.7–7.7)
Neutrophils Relative %: 71 %
Platelets: 181 10*3/uL (ref 150–400)
RBC: 4.22 MIL/uL (ref 4.22–5.81)
RDW: 26.1 % — ABNORMAL HIGH (ref 11.5–15.5)
WBC: 5 10*3/uL (ref 4.0–10.5)
nRBC: 0 % (ref 0.0–0.2)

## 2020-07-16 LAB — MAGNESIUM: Magnesium: 2 mg/dL (ref 1.7–2.4)

## 2020-07-16 MED ORDER — SODIUM CHLORIDE 0.9% FLUSH
10.0000 mL | Freq: Once | INTRAVENOUS | Status: AC
  Administered 2020-07-16: 10 mL via INTRAVENOUS
  Filled 2020-07-16: qty 10

## 2020-07-16 MED ORDER — POTASSIUM CHLORIDE CRYS ER 20 MEQ PO TBCR: EXTENDED_RELEASE_TABLET | ORAL | 3 refills | Status: DC

## 2020-07-16 MED ORDER — SODIUM CHLORIDE 0.9 % IV SOLN
INTRAVENOUS | Status: DC
  Filled 2020-07-16 (×2): qty 250

## 2020-07-16 MED ORDER — SODIUM CHLORIDE 0.9 % IV SOLN
INTRAVENOUS | Status: DC
  Filled 2020-07-16 (×2): qty 100

## 2020-07-16 MED ORDER — DEXAMETHASONE SODIUM PHOSPHATE 10 MG/ML IJ SOLN
10.0000 mg | Freq: Once | INTRAMUSCULAR | Status: AC
  Administered 2020-07-16: 10 mg via INTRAVENOUS
  Filled 2020-07-16: qty 1

## 2020-07-16 MED ORDER — HEPARIN SOD (PORK) LOCK FLUSH 100 UNIT/ML IV SOLN
500.0000 [IU] | Freq: Once | INTRAVENOUS | Status: AC
  Administered 2020-07-16: 500 [IU] via INTRAVENOUS
  Filled 2020-07-16: qty 5

## 2020-07-16 NOTE — Assessment & Plan Note (Addendum)
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  PET scan-no evidence of distant site disease.  FOLFOX adjuvant chemotherapy; s/p FOLFOX cycle #1 week ago; tolerating poorly- see below.  #Discussed with patient son that would reevaluate adjuvant chemotherapy; when the poor tolerance would consider discontinuation of oxaliplatin; discontinue 5-FU.  #Hypotension-- 68-25R systolic.  Secondary dehydration/poor p.o. intake.  Do not suspect sepsis clinically.  Dex 10 mg IV.  S/p IV bolus-at time of discharge blood pressure 120s.  Discussed regarding increased fluid intake.  #Severe hypokalemia potassium 2.7.  Will check magnesium.  Add 20 of potassium chloride IV; will send prescription.  # Iron defciency anemia-s/p Venofer; Hb 9.6 stable.  # COPD-stable continue inhalers as per pulmonary.  # DISPOSITION: # HOLD venofer today; IVF; KCL; IV DEX 10 mg # [facility]-8/25-lab-  bmp;mag;- IVF over 1 hour.  # as planned- follow up in 1 weeks- MD; labs- cbc/cmp;FOLFOX; Venofer-Dr.B

## 2020-07-16 NOTE — Telephone Encounter (Signed)
Lovena Le, Cma spoke with patient's son with these instructions.

## 2020-07-16 NOTE — Progress Notes (Signed)
2.7 critical potassium called by Vista Lawman, in cancer center lab at 0913 am

## 2020-07-16 NOTE — Progress Notes (Signed)
Verbal order to run potassium 20 meq IV piggyback with NS over 1 hour. Patient has a port a cath per Dr. Rogue Bussing.

## 2020-07-16 NOTE — Telephone Encounter (Signed)
H/T- please remind the son to pick the script for kdur/potassium from pharamacy today; and also to start imdoium for diarrhea-Thanks  Son has been informed of the above request and expressed understanding. Will pick up potassium now from pharmacy.

## 2020-07-16 NOTE — Progress Notes (Signed)
Pt tolerated Infusions today. Reported feeling stronger at time of discharge. Port needle flushed and secured as pt will be returning to clinic tomorrow. RN assisted pt to truck. BP improved 111/ 64. Denies any dizziness at discharge.

## 2020-07-16 NOTE — Progress Notes (Signed)
[place] [facility] [place] NOTE  Patient Care Team: Albina Billet, MD as PCP - General (Internal Medicine) Clent Jacks, RN as Oncology Nurse Navigator Cammie Sickle, MD as Consulting Physician (Hematology and Oncology)  CHIEF COMPLAINTS/PURPOSE OF CONSULTATION: Colon cancer  #  Oncology History Overview Note  # July 2021- RIGHT COLON ADENO CARCINOMA- Dr.Byrnett s/p right hemicolectomy- pT4b N2b- STAGE III [high risk]; MSI-STABLE.   # AUG 10th, 2021- FOLFOX [adjuvant]  -------------------------------------------------------------------------------------------   - INVASIVE MODERATELY DIFFERENTIATED ADENOCARCINOMA.  - TEN OF FIFTY-TWO LYMPH NODES POSITIVE FOR METASTATIC ADENOCARCINOMA  (10/52).  - SINGLE INCIDENTAL TUBULOVILLOUS ADENOMA.  - THREE INCIDENTAL TUBULAR ADENOMAS.  - SEE CANCER SUMMARY.   CANCER CASE SUMMARY: COLON AND RECTUM  Procedure: Right hemicolectomy  Tumor Site: Cecum  Tumor Size: Greatest dimension: 12.2 cm  Macroscopic Tumor Perforation: Present  Histologic Type: Adenocarcinoma  Histologic Grade: G2: Moderately differentiated  Tumor Extension: Tumor directly invades adjacent structures (terminal  ileum)  Margins: All margins are uninvolved by invasive carcinoma, high-grade  dysplasia/intramucosal adenocarcinoma, and low-grade dysplasia  Treatment Effect: No known presurgical therapy  Lymphovascular Invasion: Present  Perineural Invasion: Present  Tumor Deposits: Present: Multiple  Regional Lymph Nodes:       Number of Lymph Nodes Involved: 10       Number of Lymph Nodes Examined: 1  -  # COPD- [Dr.Fleming];   # SURVIVORSHIP:   # GENETICS:   DIAGNOSIS: colon cancer  STAGE:   III      ;  GOALS:cure  CURRENT/MOST RECENT THERAPY : FOLFOX     Cancer of right colon ([place])  05/20/2020 Initial Diagnosis   Cancer of right colon ([place])   07/02/2020 -  Chemotherapy   The patient had dexamethasone (DECADRON) 4 MG tablet, 8 mg,  Oral, Daily, 1 of 1 cycle, Start date: --, End date: -- palonosetron (ALOXI) injection 0.25 mg, 0.25 mg, Intravenous,  Once, 1 of 12 cycles Administration: 0.25 mg (07/02/2020) leucovorin 904 mg in dextrose 5 % 250 mL infusion, 400 mg/m2 = 904 mg, Intravenous,  Once, 1 of 12 cycles oxaliplatin (ELOXATIN) 190 mg in dextrose 5 % 500 mL chemo infusion, 85 mg/m2 = 190 mg, Intravenous,  Once, 1 of 12 cycles Administration: 190 mg (07/02/2020) fluorouracil (ADRUCIL) 5,400 mg in sodium chloride 0.9 % 142 mL chemo infusion, 2,400 mg/m2 = 5,400 mg, Intravenous, 1 Day/Dose, 1 of 12 cycles Administration: 5,400 mg (07/02/2020)  for chemotherapy treatment.       HISTORY OF PRESENTING ILLNESS:  James Hodge 74 y.o.  male stage III colon cancer-high risk status post FOLFOX cycle #1 is here for follow-up.  Patient had chemotherapy approximately 1 week ago.  Complains of poor appetite.  Complains of nausea episode of vomiting.  Loose stools 1-2 a day.  Not been taking Imodium.  Feels weak.  Feels dizzy.  No syncopal episodes.  Review of Systems  Constitutional: Positive for malaise/fatigue and weight loss. Negative for chills, diaphoresis and fever.  HENT: Negative for nosebleeds and sore throat.   Eyes: Negative for double vision.  Respiratory: Positive for cough and shortness of breath. Negative for hemoptysis, sputum production and wheezing.   Cardiovascular: Negative for chest pain, palpitations, orthopnea and leg swelling.  Gastrointestinal: Negative for abdominal pain, blood in stool, constipation, diarrhea, heartburn, melena, nausea and vomiting.  Genitourinary: Negative for dysuria, frequency and urgency.  Musculoskeletal: Negative for back pain and joint pain.  Skin: Negative.  Negative for itching and rash.  Neurological: Positive  for tingling. Negative for dizziness, focal weakness, weakness and headaches.  Endo/Heme/Allergies: Does not bruise/bleed easily.  Psychiatric/Behavioral: Negative  for depression. The patient is not nervous/anxious and does not have insomnia.      MEDICAL HISTORY:  Past Medical History:  Diagnosis Date  . Cancer ([place])   . COPD (chronic obstructive pulmonary disease) ([place])   . Hypertension     SURGICAL HISTORY: Past Surgical History:  Procedure Laterality Date  . CHOLECYSTECTOMY     Around 2001  . COLONOSCOPY WITH PROPOFOL N/A 04/12/2020   Procedure: COLONOSCOPY WITH PROPOFOL;  Surgeon: Robert Bellow, MD;  Location: [facility] ENDOSCOPY;  Service: Endoscopy;  Laterality: N/A;  . COLONOSCOPY WITH PROPOFOL N/A 04/24/2020   Procedure: COLONOSCOPY WITH PROPOFOL;  Surgeon: Robert Bellow, MD;  Location: [facility] ENDOSCOPY;  Service: Endoscopy;  Laterality: N/A;  . ESOPHAGOGASTRODUODENOSCOPY (EGD) WITH PROPOFOL N/A 04/12/2020   Procedure: ESOPHAGOGASTRODUODENOSCOPY (EGD) WITH PROPOFOL;  Surgeon: Robert Bellow, MD;  Location: [facility] ENDOSCOPY;  Service: Endoscopy;  Laterality: N/A;  . EYE SURGERY Bilateral    cataract  . LAPAROSCOPIC PARTIAL COLECTOMY Right 05/20/2020   Procedure: LAPAROSCOPIC PARTIAL COLECTOMY;  Surgeon: Robert Bellow, MD;  Location: [facility] ORS;  Service: General;  Laterality: Right;  Floyce Stakes, RNFA to assist  . PORTACATH PLACEMENT Left 06/19/2020   Procedure: INSERTION PORT-A-CATH;  Surgeon: Robert Bellow, MD;  Location: [facility] ORS;  Service: General;  Laterality: Left;    SOCIAL HISTORY: Social History   Socioeconomic History  . Marital status: Married    Spouse name: Not on file  . Number of children: Not on file  . Years of education: Not on file  . Highest education level: Not on file  Occupational History  . Not on file  Tobacco Use  . Smoking status: Former Smoker    Years: 3.00    Types: Cigarettes    Quit date: 11/23/1968    Years since quitting: 51.6  . Smokeless tobacco: Never Used  Vaping Use  . Vaping Use: Never used  Substance and Sexual Activity  . Alcohol use: Not Currently  . Drug use: Never   . Sexual activity: Not on file  Other Topics Concern  . Not on file  Social History Narrative   Lives in [place]; smoked- quit in 70s; beer social; 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; grandson    Social Determinants of Health   Financial Resource Strain:   . Difficulty of Paying Living Expenses: Not on file  Food Insecurity:   . Worried About Charity fundraiser in the Last Year: Not on file  . Ran Out of Food in the Last Year: Not on file  Transportation Needs:   . Lack of Transportation (Medical): Not on file  . Lack of Transportation (Non-Medical): Not on file  Physical Activity:   . Days of Exercise per Week: Not on file  . Minutes of Exercise per Session: Not on file  Stress:   . Feeling of Stress : Not on file  Social Connections:   . Frequency of Communication with Friends and Family: Not on file  . Frequency of Social Gatherings with Friends and Family: Not on file  . Attends Religious Services: Not on file  . Active Member of Clubs or Organizations: Not on file  . Attends Archivist Meetings: Not on file  . Marital Status: Not on file  Intimate Partner Violence:   . Fear of Current or Ex-Partner: Not on file  . Emotionally Abused: Not  on file  . Physically Abused: Not on file  . Sexually Abused: Not on file    FAMILY HISTORY: Family History  Problem Relation Age of Onset  . Multiple sclerosis Brother     ALLERGIES:  has No Known Allergies.  MEDICATIONS:  Current Outpatient Medications  Medication Sig Dispense Refill  . BREO ELLIPTA 100-25 MCG/INH AEPB Inhale 1 puff into the lungs at bedtime.    . lidocaine-prilocaine (EMLA) cream Apply 1 application topically as needed. 30 g 3  . ondansetron (ZOFRAN) 8 MG tablet One pill every 8 hours as needed for nausea/vomitting. 40 tablet 1  . prochlorperazine (COMPAZINE) 10 MG tablet Take 1 tablet (10 mg total) by mouth every 6 (six) hours as needed for nausea or vomiting. 40 tablet 1  . tiotropium (SPIRIVA) 18  MCG inhalation capsule Place 18 mcg into inhaler and inhale at bedtime.      No current facility-administered medications for this visit.   Facility-Administered Medications Ordered in Other Visits  Medication Dose Route Frequency Provider Last Rate Last Admin  . 0.9 %  sodium chloride infusion   Intravenous Continuous Cammie Sickle, MD   Stopped at 07/16/20 1142  . sodium chloride 0.9 % 100 mL with potassium chloride 20 mEq infusion   Intravenous Continuous Cammie Sickle, MD   Stopped at 07/16/20 1118      .  PHYSICAL EXAMINATION: ECOG PERFORMANCE STATUS: 1 - Symptomatic but completely ambulatory  Vitals:   07/16/20 0916 07/16/20 0919  BP: (!) 86/65 (!) 76/50  Pulse: 91   Resp: 18   Temp: 98.2 F (36.8 C)   SpO2: 97%    Filed Weights   07/16/20 0916  Weight: 203 lb 12.8 oz (92.4 kg)    Physical Exam Constitutional:      Comments: Elderly male patient.  Patient is in a wheelchair.  Accompanied by his son.  HENT:     Head: Normocephalic and atraumatic.     Mouth/Throat:     Pharynx: No oropharyngeal exudate.  Eyes:     Pupils: Pupils are equal, round, and reactive to light.  Cardiovascular:     Rate and Rhythm: Normal rate and regular rhythm.  Pulmonary:     Effort: No respiratory distress.     Breath sounds: No wheezing.     Comments: Decreased air entry bilaterally.  No wheeze or crackles Abdominal:     General: Bowel sounds are normal. There is no distension.     Palpations: Abdomen is soft. There is no mass.     Tenderness: There is no abdominal tenderness. There is no guarding or rebound.  Musculoskeletal:        General: No tenderness. Normal range of motion.     Cervical back: Normal range of motion and neck supple.  Skin:    General: Skin is warm.  Neurological:     Mental Status: He is alert and oriented to person, place, and time.  Psychiatric:        Mood and Affect: Affect normal.      LABORATORY DATA:  I have reviewed the data  as listed Lab Results  Component Value Date   WBC 5.0 07/16/2020   HGB 9.8 (L) 07/16/2020   HCT 33.1 (L) 07/16/2020   MCV 78.4 (L) 07/16/2020   PLT 181 07/16/2020   Recent Labs    07/02/20 0813 07/09/20 1327 07/16/20 0846  NA 140 138 139  K 3.8 3.2* 2.7*  CL 106 106 105  CO2  _0 GLUCOSE 113* 99 129*  BUN _1 CREATININE 0.80 0.79 0.88  CALCIUM 8.8* 8.4* 8.0*  GFRNONAA >60 >60 >60  GFRAA >60 >60 >60  PROT 6.9 6.7 6.6  ALBUMIN 3.4* 3.5 3.2*  AST 19 24 33  ALT _2 ALKPHOS 47 50 61  BILITOT 1.3* 1.4* 1.1    RADIOGRAPHIC STUDIES: I have personally reviewed the radiological images as listed and agreed with the findings in the report. NM PET Image Initial (PI) Skull Base To Thigh  Result Date: 06/19/2020 CLINICAL DATA:  Initial treatment strategy for right-sided colon cancer. Colon resection 05/26/2020. No COVID vaccinations. COPD. EXAM: NUCLEAR MEDICINE PET SKULL BASE TO THIGH TECHNIQUE: 11.3 mCi F-18 FDG was injected intravenously. Full-ring PET imaging was performed from the skull base to thigh after the radiotracer. CT data was obtained and used for attenuation correction and anatomic localization. Fasting blood glucose: 103 mg/dl COMPARISON:  05/06/2020 FINDINGS: Mediastinal blood pool activity: SUV max 2.4 Liver activity: SUV max NA NECK: No areas of abnormal hypermetabolism. Incidental CT findings: Bilateral carotid atherosclerosis. No cervical adenopathy. CHEST: No pulmonary parenchymal or thoracic nodal hypermetabolism. Incidental CT findings: Development of small, left larger than right pleural effusions since the prior abdominal CT. Aortic and lad coronary artery atherosclerosis. Mild cardiomegaly. A subpleural 4 mm right lower lobe pulmonary nodule on 100/3 is most likely a subpleural lymph node. Mild centrilobular emphysema. ABDOMEN/PELVIS: Midline abdominal wall hypermetabolism is likely related to prior laparotomy. Right hemicolectomy. The previously  described pericolonic adenopathy has presumably also been resected. No abdominopelvic nodal or parenchymal hypermetabolism. Incidental CT findings: Normal adrenal glands. Large colonic stool burden. Abdominal aortic atherosclerosis. Circumaortic left renal vein. Mild prostatomegaly. SKELETON: No osseous hypermetabolism. A focus of hypermetabolism about the medial left upper extremity, at the junction of the muscular and subcutaneous tissues is without CT correlate. Measures a S.U.V. max of 17.5, including on approximately image 42/3. Incidental CT findings: none IMPRESSION: 1. Status post interval right hemicolectomy without typical findings of hypermetabolic residual or metastatic disease. 2. Focus of hypermetabolism within the medial left upper extremity. Given the lack of definite CT correlate, and the fact the patient was administered tracer in the left antecubital fossa, this is favored to be related to tracer accumulation. Felt unlikely to represent metastatic disease. This could be either be re-evaluated on follow-up PET, or if more entire characterization is desired, a left upper extremity pre and post-contrast MRI performed. 3. New bilateral pleural effusions. 4. Incidental findings, including: Aortic Atherosclerosis (ICD10-I70.0). Possible constipation. Emphysema (ICD10-J43.9). Coronary artery atherosclerosis. Electronically Signed   By: Abigail Miyamoto M.D.   On: 06/19/2020 14:04   DG Chest Port 1 View  Result Date: 06/19/2020 CLINICAL DATA:  Port-A-Cath placement EXAM: PORTABLE CHEST 1 VIEW COMPARISON:  None. FINDINGS: Power port placed from a left subclavian approach. Catheter tip in the SVC 1 cm above the right atrium. No pneumothorax. Lungs are clear. IMPRESSION: Power port tip in the SVC 1 cm above the right atrium. No pneumothorax. No active disease. Electronically Signed   By: Nelson Chimes M.D.   On: 06/19/2020 14:16   DG C-Arm 1-60 Min-No Report  Result Date: 06/19/2020 Fluoroscopy was  utilized by the requesting physician.  No radiographic interpretation.    ASSESSMENT & PLAN:   Cancer of right colon ([place]) #Colon cancer stage III-PT4BN2B- high risk.  PET scan-no evidence of distant site disease.  FOLFOX adjuvant chemotherapy; s/p FOLFOX cycle #1 week ago; tolerating poorly-  see below.  #Discussed with patient son that would reevaluate adjuvant chemotherapy; when the poor tolerance would consider discontinuation of oxaliplatin; discontinue 5-FU.  #Hypotension-- 48-47U systolic.  Secondary dehydration/poor p.o. intake.  Do not suspect sepsis clinically.  Dex 10 mg IV.  S/p IV bolus-at time of discharge blood pressure 120s.  Discussed regarding increased fluid intake.  #Severe hypokalemia potassium 2.7.  Will check magnesium.  Add 20 of potassium chloride IV; will send prescription.  # Iron defciency anemia-s/p Venofer; Hb 9.6 stable.  # COPD-stable continue inhalers as per pulmonary.  # DISPOSITION: # HOLD venofer today; IVF; KCL; IV DEX 10 mg # [facility]-8/25-lab-  bmp;mag;- IVF over 1 hour.  # as planned- follow up in 1 weeks- MD; labs- cbc/cmp;FOLFOX; Venofer-Dr.B   All questions were answered. The patient knows to call the clinic with any problems, questions or concerns.    Cammie Sickle, MD 07/16/2020 2:02 PM

## 2020-07-17 ENCOUNTER — Inpatient Hospital Stay (HOSPITAL_BASED_OUTPATIENT_CLINIC_OR_DEPARTMENT_OTHER): Payer: Medicare HMO | Admitting: Oncology

## 2020-07-17 ENCOUNTER — Inpatient Hospital Stay: Payer: Medicare HMO

## 2020-07-17 ENCOUNTER — Inpatient Hospital Stay: Payer: Medicare HMO | Attending: Oncology

## 2020-07-17 VITALS — BP 119/60 | HR 53

## 2020-07-17 VITALS — BP 102/66 | HR 77 | Temp 96.9°F | Resp 16 | Wt 206.0 lb

## 2020-07-17 DIAGNOSIS — C182 Malignant neoplasm of ascending colon: Secondary | ICD-10-CM

## 2020-07-17 DIAGNOSIS — E876 Hypokalemia: Secondary | ICD-10-CM

## 2020-07-17 DIAGNOSIS — Z79899 Other long term (current) drug therapy: Secondary | ICD-10-CM | POA: Insufficient documentation

## 2020-07-17 DIAGNOSIS — E860 Dehydration: Secondary | ICD-10-CM

## 2020-07-17 DIAGNOSIS — Z5111 Encounter for antineoplastic chemotherapy: Secondary | ICD-10-CM | POA: Diagnosis not present

## 2020-07-17 DIAGNOSIS — Z95828 Presence of other vascular implants and grafts: Secondary | ICD-10-CM

## 2020-07-17 LAB — BASIC METABOLIC PANEL
Anion gap: 7 (ref 5–15)
BUN: 18 mg/dL (ref 8–23)
CO2: 26 mmol/L (ref 22–32)
Calcium: 8.4 mg/dL — ABNORMAL LOW (ref 8.9–10.3)
Chloride: 110 mmol/L (ref 98–111)
Creatinine, Ser: 0.82 mg/dL (ref 0.61–1.24)
GFR calc Af Amer: >60 mL/min (ref >60)
GFR calc non Af Amer: >60 mL/min (ref >60)
Glucose, Bld: 110 mg/dL — ABNORMAL HIGH (ref 70–99)
Potassium: 3.7 mmol/L (ref 3.5–5.1)
Sodium: 143 mmol/L (ref 135–145)

## 2020-07-17 LAB — MAGNESIUM: Magnesium: 2 mg/dL (ref 1.7–2.4)

## 2020-07-17 MED ORDER — DEXAMETHASONE SODIUM PHOSPHATE 10 MG/ML IJ SOLN
10.0000 mg | Freq: Once | INTRAMUSCULAR | Status: AC
  Administered 2020-07-17: 10 mg via INTRAVENOUS
  Filled 2020-07-17: qty 1

## 2020-07-17 MED ORDER — SODIUM CHLORIDE 0.9% FLUSH
10.0000 mL | Freq: Once | INTRAVENOUS | Status: AC
  Administered 2020-07-17: 10 mL via INTRAVENOUS
  Filled 2020-07-17: qty 10

## 2020-07-17 MED ORDER — HEPARIN SOD (PORK) LOCK FLUSH 100 UNIT/ML IV SOLN
500.0000 [IU] | Freq: Once | INTRAVENOUS | Status: AC
  Filled 2020-07-17: qty 5

## 2020-07-17 MED ORDER — HEPARIN SOD (PORK) LOCK FLUSH 100 UNIT/ML IV SOLN
500.0000 [IU] | Freq: Once | INTRAVENOUS | Status: AC
  Administered 2020-07-17: 500 [IU] via INTRAVENOUS
  Filled 2020-07-17: qty 5

## 2020-07-17 MED ORDER — SODIUM CHLORIDE 0.9 % IV SOLN
Freq: Once | INTRAVENOUS | Status: AC
  Filled 2020-07-17: qty 250

## 2020-07-17 MED ORDER — SODIUM CHLORIDE 0.9 % IV SOLN
INTRAVENOUS | Status: DC
  Filled 2020-07-17: qty 250

## 2020-07-17 NOTE — Progress Notes (Signed)
Symptom Management Consult note [facility]  [phone] [phone]  Patient Care Team: Albina Billet, MD as PCP - General (Internal Medicine) Clent Jacks, RN as Oncology Nurse Navigator Cammie Sickle, MD as Consulting Physician (Hematology and Oncology)   Name of the patient: James Hodge  [id number]  07/26/1946   Date of visit: 07/17/2020   Diagnosis- Colon cancer   Chief complaint/ Reason for visit- Follow-up labs  Heme/Onc history:  Oncology History Overview Note  # July 2021- RIGHT COLON ADENO CARCINOMA- Dr.Byrnett s/p right hemicolectomy- pT4b N2b- STAGE III [high risk]; MSI-STABLE.   # AUG 10th, 2021- FOLFOX [adjuvant]  -------------------------------------------------------------------------------------------   - INVASIVE MODERATELY DIFFERENTIATED ADENOCARCINOMA.  - TEN OF FIFTY-TWO LYMPH NODES POSITIVE FOR METASTATIC ADENOCARCINOMA  (10/52).  - SINGLE INCIDENTAL TUBULOVILLOUS ADENOMA.  - THREE INCIDENTAL TUBULAR ADENOMAS.  - SEE CANCER SUMMARY.   CANCER CASE SUMMARY: COLON AND RECTUM  Procedure: Right hemicolectomy  Tumor Site: Cecum  Tumor Size: Greatest dimension: 12.2 cm  Macroscopic Tumor Perforation: Present  Histologic Type: Adenocarcinoma  Histologic Grade: G2: Moderately differentiated  Tumor Extension: Tumor directly invades adjacent structures (terminal  ileum)  Margins: All margins are uninvolved by invasive carcinoma, high-grade  dysplasia/intramucosal adenocarcinoma, and low-grade dysplasia  Treatment Effect: No known presurgical therapy  Lymphovascular Invasion: Present  Perineural Invasion: Present  Tumor Deposits: Present: Multiple  Regional Lymph Nodes:       Number of Lymph Nodes Involved: 10       Number of Lymph Nodes Examined: 40  -  # COPD- [Dr.Fleming];   # SURVIVORSHIP:   # GENETICS:   DIAGNOSIS: colon cancer  STAGE:   III      ;  GOALS:cure  CURRENT/MOST RECENT  THERAPY : FOLFOX     Cancer of right colon ([place])  05/20/2020 Initial Diagnosis   Cancer of right colon ([place])   07/02/2020 -  Chemotherapy   The patient had dexamethasone (DECADRON) 4 MG tablet, 8 mg, Oral, Daily, 1 of 1 cycle, Start date: --, End date: -- palonosetron (ALOXI) injection 0.25 mg, 0.25 mg, Intravenous,  Once, 1 of 12 cycles Administration: 0.25 mg (07/02/2020) leucovorin 904 mg in dextrose 5 % 250 mL infusion, 400 mg/m2 = 904 mg, Intravenous,  Once, 1 of 12 cycles oxaliplatin (ELOXATIN) 190 mg in dextrose 5 % 500 mL chemo infusion, 85 mg/m2 = 190 mg, Intravenous,  Once, 1 of 12 cycles Administration: 190 mg (07/02/2020) fluorouracil (ADRUCIL) 5,400 mg in sodium chloride 0.9 % 142 mL chemo infusion, 2,400 mg/m2 = 5,400 mg, Intravenous, 1 Day/Dose, 1 of 12 cycles Administration: 5,400 mg (07/02/2020)  for chemotherapy treatment.     Interval history-James Hodge is a 74 year old male with past medical history significant for iron deficiency and hypokalemia who was recently diagnosed with right colon cancer. Colon cancer discovered during recent colonoscopy. Has history of tobacco abuse. Had right hemocolectomy with Dr. Tollie Pizza on 05/26/2020. There was extensive nodal involvement on CT imaging. PET scan did not reveal evidence of distant site metastasis. He is status post 1 cycle of FOLFOX given on 07/02/2020.  He was seen by Dr. Rogue Bussing on 07/16/2020 to assess tolerance of treatment.  He was severely hypertensive with systolic blood pressure in the 70s-80s.  He was given an IV bolus and 10 mg of IV dexamethasone.  Blood pressure improved.  He was also found to be severely hypokalemic with a potassium of 2.7.  He was given 20 mEq of potassium IV  and sent home with potassium supplements.  He was scheduled to return to clinic the next day for additional IV fluids and assessment.  Today, patient feels much better and has been able to tolerate food and liquid.  Still has some weakness and  fatigue but otherwise has improved.  He denies any fever or illness, easy bleeding or bruising.  His appetite is improving.  He denies weight loss, chest pain, nausea, vomiting, constipation or diarrhea.  Having normal bowel movements.  ECOG FS:1 - Symptomatic but completely ambulatory  Review of systems- Review of Systems  Constitutional: Negative.  Negative for chills, fever, malaise/fatigue and weight loss.  HENT: Negative for congestion, ear pain and tinnitus.   Eyes: Negative.  Negative for blurred vision and double vision.  Respiratory: Negative.  Negative for cough, sputum production and shortness of breath.   Cardiovascular: Negative.  Negative for chest pain, palpitations and leg swelling.  Gastrointestinal: Negative.  Negative for abdominal pain, constipation, diarrhea, nausea and vomiting.  Genitourinary: Negative for dysuria, frequency and urgency.  Musculoskeletal: Negative for back pain and falls.  Skin: Negative.  Negative for rash.  Neurological: Negative.  Negative for weakness and headaches.  Endo/Heme/Allergies: Negative.  Does not bruise/bleed easily.  Psychiatric/Behavioral: Negative.  Negative for depression. The patient is not nervous/anxious and does not have insomnia.      Current treatment- s/p 1 cycle FOLFOX  No Known Allergies   Past Medical History:  Diagnosis Date  . Cancer ([place])   . COPD (chronic obstructive pulmonary disease) ([place])   . Hypertension      Past Surgical History:  Procedure Laterality Date  . CHOLECYSTECTOMY     Around 2001  . COLONOSCOPY WITH PROPOFOL N/A 04/12/2020   Procedure: COLONOSCOPY WITH PROPOFOL;  Surgeon: Robert Bellow, MD;  Location: [facility] ENDOSCOPY;  Service: Endoscopy;  Laterality: N/A;  . COLONOSCOPY WITH PROPOFOL N/A 04/24/2020   Procedure: COLONOSCOPY WITH PROPOFOL;  Surgeon: Robert Bellow, MD;  Location: [facility] ENDOSCOPY;  Service: Endoscopy;  Laterality: N/A;  . ESOPHAGOGASTRODUODENOSCOPY (EGD) WITH PROPOFOL  N/A 04/12/2020   Procedure: ESOPHAGOGASTRODUODENOSCOPY (EGD) WITH PROPOFOL;  Surgeon: Robert Bellow, MD;  Location: [facility] ENDOSCOPY;  Service: Endoscopy;  Laterality: N/A;  . EYE SURGERY Bilateral    cataract  . LAPAROSCOPIC PARTIAL COLECTOMY Right 05/20/2020   Procedure: LAPAROSCOPIC PARTIAL COLECTOMY;  Surgeon: Robert Bellow, MD;  Location: [facility] ORS;  Service: General;  Laterality: Right;  Floyce Stakes, RNFA to assist  . PORTACATH PLACEMENT Left 06/19/2020   Procedure: INSERTION PORT-A-CATH;  Surgeon: Robert Bellow, MD;  Location: [facility] ORS;  Service: General;  Laterality: Left;    Social History   Socioeconomic History  . Marital status: Married    Spouse name: Not on file  . Number of children: Not on file  . Years of education: Not on file  . Highest education level: Not on file  Occupational History  . Not on file  Tobacco Use  . Smoking status: Former Smoker    Years: 3.00    Types: Cigarettes    Quit date: 11/23/1968    Years since quitting: 51.6  . Smokeless tobacco: Never Used  Vaping Use  . Vaping Use: Never used  Substance and Sexual Activity  . Alcohol use: Not Currently  . Drug use: Never  . Sexual activity: Not on file  Other Topics Concern  . Not on file  Social History Narrative   Lives in [place]; smoked- quit in 70s; beer social; HVAC retd. Lives  with wife; grandson    Social Determinants of Health   Financial Resource Strain:   . Difficulty of Paying Living Expenses: Not on file  Food Insecurity:   . Worried About Charity fundraiser in the Last Year: Not on file  . Ran Out of Food in the Last Year: Not on file  Transportation Needs:   . Lack of Transportation (Medical): Not on file  . Lack of Transportation (Non-Medical): Not on file  Physical Activity:   . Days of Exercise per Week: Not on file  . Minutes of Exercise per Session: Not on file  Stress:   . Feeling of Stress : Not on file  Social Connections:   . Frequency of  Communication with Friends and Family: Not on file  . Frequency of Social Gatherings with Friends and Family: Not on file  . Attends Religious Services: Not on file  . Active Member of Clubs or Organizations: Not on file  . Attends Archivist Meetings: Not on file  . Marital Status: Not on file  Intimate Partner Violence:   . Fear of Current or Ex-Partner: Not on file  . Emotionally Abused: Not on file  . Physically Abused: Not on file  . Sexually Abused: Not on file    Family History  Problem Relation Age of Onset  . Multiple sclerosis Brother      Current Outpatient Medications:  .  BREO ELLIPTA 100-25 MCG/INH AEPB, Inhale 1 puff into the lungs at bedtime., Disp: , Rfl:  .  lidocaine-prilocaine (EMLA) cream, Apply 1 application topically as needed., Disp: 30 g, Rfl: 3 .  ondansetron (ZOFRAN) 8 MG tablet, One pill every 8 hours as needed for nausea/vomitting., Disp: 40 tablet, Rfl: 1 .  potassium chloride SA (KLOR-CON) 20 MEQ tablet, 1 pill twice a day, Disp: 30 tablet, Rfl: 3 .  prochlorperazine (COMPAZINE) 10 MG tablet, Take 1 tablet (10 mg total) by mouth every 6 (six) hours as needed for nausea or vomiting., Disp: 40 tablet, Rfl: 1 .  tiotropium (SPIRIVA) 18 MCG inhalation capsule, Place 18 mcg into inhaler and inhale at bedtime. , Disp: , Rfl:  No current facility-administered medications for this visit.  Facility-Administered Medications Ordered in Other Visits:  .  heparin lock flush 100 unit/mL, 500 Units, Intravenous, Once, Cammie Sickle, MD  Physical exam:  Vitals:   07/17/20 1338  BP: 102/66  Pulse: 77  Resp: 16  Temp: (!) 96.9 F (36.1 C)  TempSrc: Tympanic  SpO2: 96%  Weight: 206 lb (93.4 kg)   Physical Exam Constitutional:      Appearance: Normal appearance.  HENT:     Head: Normocephalic and atraumatic.  Eyes:     Pupils: Pupils are equal, round, and reactive to light.  Cardiovascular:     Rate and Rhythm: Normal rate and regular  rhythm.     Heart sounds: Normal heart sounds. No murmur heard.   Pulmonary:     Effort: Pulmonary effort is normal.     Breath sounds: Normal breath sounds. No wheezing.  Abdominal:     General: Bowel sounds are normal. There is no distension.     Palpations: Abdomen is soft.     Tenderness: There is no abdominal tenderness.  Musculoskeletal:        General: Normal range of motion.     Cervical back: Normal range of motion.  Skin:    General: Skin is warm and dry.     Findings: No rash.  Neurological:     Mental Status: He is alert and oriented to person, place, and time.  Psychiatric:        Judgment: Judgment normal.      CMP Latest Ref Rng & Units 07/17/2020  Glucose 70 - 99 mg/dL 110(H)  BUN 8 - 23 mg/dL 18  Creatinine 0.61 - 1.24 mg/dL 0.82  Sodium 135 - 145 mmol/L 143  Potassium 3.5 - 5.1 mmol/L 3.7  Chloride 98 - 111 mmol/L 110  CO2 22 - 32 mmol/L 26  Calcium 8.9 - 10.3 mg/dL 8.4(L)  Total Protein 6.5 - 8.1 g/dL -  Total Bilirubin 0.3 - 1.2 mg/dL -  Alkaline Phos 38 - 126 U/L -  AST 15 - 41 U/L -  ALT 0 - 44 U/L -   CBC Latest Ref Rng & Units 07/16/2020  WBC 4.0 - 10.5 K/uL 5.0  Hemoglobin 13.0 - 17.0 g/dL 9.8(L)  Hematocrit 39 - 52 % 33.1(L)  Platelets 150 - 400 K/uL 181    No images are attached to the encounter.  NM PET Image Initial (PI) Skull Base To Thigh  Result Date: 06/19/2020 CLINICAL DATA:  Initial treatment strategy for right-sided colon cancer. Colon resection 05/26/2020. No COVID vaccinations. COPD. EXAM: NUCLEAR MEDICINE PET SKULL BASE TO THIGH TECHNIQUE: 11.3 mCi F-18 FDG was injected intravenously. Full-ring PET imaging was performed from the skull base to thigh after the radiotracer. CT data was obtained and used for attenuation correction and anatomic localization. Fasting blood glucose: 103 mg/dl COMPARISON:  05/06/2020 FINDINGS: Mediastinal blood pool activity: SUV max 2.4 Liver activity: SUV max NA NECK: No areas of abnormal  hypermetabolism. Incidental CT findings: Bilateral carotid atherosclerosis. No cervical adenopathy. CHEST: No pulmonary parenchymal or thoracic nodal hypermetabolism. Incidental CT findings: Development of small, left larger than right pleural effusions since the prior abdominal CT. Aortic and lad coronary artery atherosclerosis. Mild cardiomegaly. A subpleural 4 mm right lower lobe pulmonary nodule on 100/3 is most likely a subpleural lymph node. Mild centrilobular emphysema. ABDOMEN/PELVIS: Midline abdominal wall hypermetabolism is likely related to prior laparotomy. Right hemicolectomy. The previously described pericolonic adenopathy has presumably also been resected. No abdominopelvic nodal or parenchymal hypermetabolism. Incidental CT findings: Normal adrenal glands. Large colonic stool burden. Abdominal aortic atherosclerosis. Circumaortic left renal vein. Mild prostatomegaly. SKELETON: No osseous hypermetabolism. A focus of hypermetabolism about the medial left upper extremity, at the junction of the muscular and subcutaneous tissues is without CT correlate. Measures a S.U.V. max of 17.5, including on approximately image 42/3. Incidental CT findings: none IMPRESSION: 1. Status post interval right hemicolectomy without typical findings of hypermetabolic residual or metastatic disease. 2. Focus of hypermetabolism within the medial left upper extremity. Given the lack of definite CT correlate, and the fact the patient was administered tracer in the left antecubital fossa, this is favored to be related to tracer accumulation. Felt unlikely to represent metastatic disease. This could be either be re-evaluated on follow-up PET, or if more entire characterization is desired, a left upper extremity pre and post-contrast MRI performed. 3. New bilateral pleural effusions. 4. Incidental findings, including: Aortic Atherosclerosis (ICD10-I70.0). Possible constipation. Emphysema (ICD10-J43.9). Coronary artery  atherosclerosis. Electronically Signed   By: Abigail Miyamoto M.D.   On: 06/19/2020 14:04   DG Chest Port 1 View  Result Date: 06/19/2020 CLINICAL DATA:  Port-A-Cath placement EXAM: PORTABLE CHEST 1 VIEW COMPARISON:  None. FINDINGS: Power port placed from a left subclavian approach. Catheter tip in the SVC 1 cm above the right atrium.  No pneumothorax. Lungs are clear. IMPRESSION: Power port tip in the SVC 1 cm above the right atrium. No pneumothorax. No active disease. Electronically Signed   By: Nelson Chimes M.D.   On: 06/19/2020 14:16   DG C-Arm 1-60 Min-No Report  Result Date: 06/19/2020 Fluoroscopy was utilized by the requesting physician.  No radiographic interpretation.   Assessment and plan- Patient is a 74 y.o. male with past medical history significant for tobacco abuse who was recently diagnosed with colon cancer by routine colonoscopy.  He is status post 1 cycle FOLFOX which he did not tolerate well.  He is followed by Dr. Rogue Bussing.  Lab follow-up today shows improvement of electrolytes.  Vital signs are stable blood pressure significantly improved and is within normal limits.  Weight is up 3 pounds.  We will proceed with additional IV fluids and IV dexamethasone.  Hypokalemia-resolved.  Continue supplements  Dehydration-improving.  Encouraged hydration at home.  No nausea or vomiting.  Will give additional IV fluids today.  Anorexia-we will give IV steroids.  Encouraged high-calorie high-protein meals.  Plan: IV fluids. Continue p.o. potassium supplements. 10 mg IV dexamethasone.  Disposition: RTC on 07/23/2020 for lab work and MD assessment prior to cycle 2 FOLFOX.   Visit Diagnosis 1. Cancer of right colon ([place])   2. Dehydration     Patient expressed understanding and was in agreement with this plan. He also understands that He can call clinic at any time with any questions, concerns, or complaints.   Greater than 50% was spent in counseling and coordination of care with  this patient including but not limited to discussion of the relevant topics above (See A&P) including, but not limited to diagnosis and management of acute and chronic medical conditions.   Thank you for allowing me to participate in the care of this very pleasant patient.    Jacquelin Hawking, NP [place] [facility] Cell - [phone] Pager- [phone] 07/19/2020 10:10 AM

## 2020-07-17 NOTE — Progress Notes (Signed)
Pt returns to [facility] for dehydration. He was seen yesterday.

## 2020-07-18 ENCOUNTER — Inpatient Hospital Stay: Payer: Medicare HMO

## 2020-07-22 ENCOUNTER — Other Ambulatory Visit: Payer: Self-pay

## 2020-07-22 NOTE — Progress Notes (Signed)
Prescreening apt call to patient. Spoke with patient prior to his apt with Dr. Rogue Bussing. Patient stated that he is feeling much better. No N&V or diarrhea. He does report mild numbness and tingling in his hands.

## 2020-07-23 ENCOUNTER — Inpatient Hospital Stay: Payer: Medicare HMO

## 2020-07-23 ENCOUNTER — Inpatient Hospital Stay (HOSPITAL_BASED_OUTPATIENT_CLINIC_OR_DEPARTMENT_OTHER): Payer: Medicare HMO | Admitting: Internal Medicine

## 2020-07-23 ENCOUNTER — Other Ambulatory Visit: Payer: Self-pay | Admitting: Internal Medicine

## 2020-07-23 ENCOUNTER — Encounter: Payer: Self-pay | Admitting: Internal Medicine

## 2020-07-23 VITALS — BP 104/68 | HR 57 | Temp 96.8°F | Resp 17

## 2020-07-23 DIAGNOSIS — C182 Malignant neoplasm of ascending colon: Secondary | ICD-10-CM

## 2020-07-23 DIAGNOSIS — E611 Iron deficiency: Secondary | ICD-10-CM

## 2020-07-23 DIAGNOSIS — E876 Hypokalemia: Secondary | ICD-10-CM

## 2020-07-23 DIAGNOSIS — Z7189 Other specified counseling: Secondary | ICD-10-CM

## 2020-07-23 DIAGNOSIS — Z5111 Encounter for antineoplastic chemotherapy: Secondary | ICD-10-CM | POA: Diagnosis not present

## 2020-07-23 LAB — CBC WITH DIFFERENTIAL/PLATELET
Abs Immature Granulocytes: 0 10*3/uL (ref 0.00–0.07)
Basophils Absolute: 0.1 10*3/uL (ref 0.0–0.1)
Basophils Relative: 1 %
Eosinophils Absolute: 0.3 10*3/uL (ref 0.0–0.5)
Eosinophils Relative: 3 %
HCT: 33 % — ABNORMAL LOW (ref 39.0–52.0)
Hemoglobin: 9.8 g/dL — ABNORMAL LOW (ref 13.0–17.0)
Lymphocytes Relative: 13 %
Lymphs Abs: 1.3 10*3/uL (ref 0.7–4.0)
MCH: 24 pg — ABNORMAL LOW (ref 26.0–34.0)
MCHC: 29.7 g/dL — ABNORMAL LOW (ref 30.0–36.0)
MCV: 80.9 fL (ref 80.0–100.0)
Monocytes Absolute: 1 10*3/uL (ref 0.1–1.0)
Monocytes Relative: 10 %
Neutro Abs: 7.2 10*3/uL (ref 1.7–7.7)
Neutrophils Relative %: 73 %
Platelets: 339 10*3/uL (ref 150–400)
RBC: 4.08 MIL/uL — ABNORMAL LOW (ref 4.22–5.81)
RDW: 27.5 % — ABNORMAL HIGH (ref 11.5–15.5)
Smear Review: ADEQUATE
WBC Morphology: ABNORMAL
WBC: 9.9 10*3/uL (ref 4.0–10.5)
nRBC: 0 % (ref 0.0–0.2)

## 2020-07-23 LAB — COMPREHENSIVE METABOLIC PANEL
ALT: 22 U/L (ref 0–44)
AST: 19 U/L (ref 15–41)
Albumin: 2.9 g/dL — ABNORMAL LOW (ref 3.5–5.0)
Alkaline Phosphatase: 64 U/L (ref 38–126)
Anion gap: 9 (ref 5–15)
BUN: 12 mg/dL (ref 8–23)
CO2: 23 mmol/L (ref 22–32)
Calcium: 8.2 mg/dL — ABNORMAL LOW (ref 8.9–10.3)
Chloride: 109 mmol/L (ref 98–111)
Creatinine, Ser: 0.81 mg/dL (ref 0.61–1.24)
GFR calc Af Amer: >60 mL/min (ref >60)
GFR calc non Af Amer: >60 mL/min (ref >60)
Glucose, Bld: 137 mg/dL — ABNORMAL HIGH (ref 70–99)
Potassium: 3.5 mmol/L (ref 3.5–5.1)
Sodium: 141 mmol/L (ref 135–145)
Total Bilirubin: 1 mg/dL (ref 0.3–1.2)
Total Protein: 6.4 g/dL — ABNORMAL LOW (ref 6.5–8.1)

## 2020-07-23 MED ORDER — SODIUM CHLORIDE 0.9 % IV SOLN
10.0000 mg | Freq: Once | INTRAVENOUS | Status: AC
  Administered 2020-07-23: 10 mg via INTRAVENOUS
  Filled 2020-07-23: qty 10

## 2020-07-23 MED ORDER — SODIUM CHLORIDE 0.9 % IV SOLN
Freq: Once | INTRAVENOUS | Status: AC
  Filled 2020-07-23: qty 250

## 2020-07-23 MED ORDER — SODIUM CHLORIDE 0.9 % IV SOLN
2400.0000 mg/m2 | INTRAVENOUS | Status: DC
  Administered 2020-07-23: 5400 mg via INTRAVENOUS
  Filled 2020-07-23: qty 108

## 2020-07-23 MED ORDER — IRON SUCROSE 20 MG/ML IV SOLN
200.0000 mg | Freq: Once | INTRAVENOUS | Status: AC
  Administered 2020-07-23: 200 mg via INTRAVENOUS
  Filled 2020-07-23: qty 10

## 2020-07-23 MED ORDER — SODIUM CHLORIDE 0.9 % IV SOLN: 200.0000 mg | Freq: Once | INTRAVENOUS | Status: DC

## 2020-07-23 MED ORDER — SODIUM CHLORIDE 0.9% FLUSH
10.0000 mL | INTRAVENOUS | Status: DC | PRN
  Administered 2020-07-23: 10 mL via INTRAVENOUS
  Filled 2020-07-23: qty 10

## 2020-07-23 NOTE — Progress Notes (Signed)
[facility] NOTE  Patient Care Team: Albina Billet, MD as PCP - General (Internal Medicine) Clent Jacks, RN as Oncology Nurse Navigator Cammie Sickle, MD as Consulting Physician (Hematology and Oncology)  CHIEF COMPLAINTS/PURPOSE OF CONSULTATION: Colon cancer  #  Oncology History Overview Note  # July 2021- RIGHT COLON ADENO CARCINOMA- Dr.Byrnett s/p right hemicolectomy- pT4b N2b- STAGE III [high risk]; MSI-STABLE.   # AUG 10th, 2021- FOLFOX [adjuvant]  -------------------------------------------------------------------------------------------   - INVASIVE MODERATELY DIFFERENTIATED ADENOCARCINOMA.  - TEN OF FIFTY-TWO LYMPH NODES POSITIVE FOR METASTATIC ADENOCARCINOMA  (10/52).  - SINGLE INCIDENTAL TUBULOVILLOUS ADENOMA.  - THREE INCIDENTAL TUBULAR ADENOMAS.  - SEE CANCER SUMMARY.   CANCER CASE SUMMARY: COLON AND RECTUM  Procedure: Right hemicolectomy  Tumor Site: Cecum  Tumor Size: Greatest dimension: 12.2 cm  Macroscopic Tumor Perforation: Present  Histologic Type: Adenocarcinoma  Histologic Grade: G2: Moderately differentiated  Tumor Extension: Tumor directly invades adjacent structures (terminal  ileum)  Margins: All margins are uninvolved by invasive carcinoma, high-grade  dysplasia/intramucosal adenocarcinoma, and low-grade dysplasia  Treatment Effect: No known presurgical therapy  Lymphovascular Invasion: Present  Perineural Invasion: Present  Tumor Deposits: Present: Multiple  Regional Lymph Nodes:       Number of Lymph Nodes Involved: 10       Number of Lymph Nodes Examined: 40  -  # COPD- [Dr.Fleming];   # SURVIVORSHIP:   # GENETICS:   DIAGNOSIS: colon cancer  STAGE:   III      ;  GOALS:cure  CURRENT/MOST RECENT THERAPY : FOLFOX     Cancer of right colon ([place])  05/20/2020 Initial Diagnosis   Cancer of right colon ([place])   07/02/2020 -  Chemotherapy   The patient had dexamethasone (DECADRON) 4 MG tablet, 8 mg,  Oral, Daily, 1 of 1 cycle, Start date: --, End date: -- palonosetron (ALOXI) injection 0.25 mg, 0.25 mg, Intravenous,  Once, 1 of 1 cycle Administration: 0.25 mg (07/02/2020) leucovorin 904 mg in dextrose 5 % 250 mL infusion, 400 mg/m2 = 904 mg, Intravenous,  Once, 1 of 1 cycle oxaliplatin (ELOXATIN) 190 mg in dextrose 5 % 500 mL chemo infusion, 85 mg/m2 = 190 mg, Intravenous,  Once, 1 of 1 cycle Administration: 190 mg (07/02/2020) fluorouracil (ADRUCIL) 5,400 mg in sodium chloride 0.9 % 142 mL chemo infusion, 2,400 mg/m2 = 5,400 mg, Intravenous, 1 Day/Dose, 2 of 12 cycles Administration: 5,400 mg (07/02/2020), 5,400 mg (07/23/2020)  for chemotherapy treatment.       HISTORY OF PRESENTING ILLNESS:  James Hodge 74 y.o.  male stage III colon cancer-high risk currently on adjuvant FOLFOX is here for a follow-up.  Patient status post chemotherapy cycle #1 approximately 2 weeks ago-was evaluated in the symptom management clinic-poor appetite/nausea vomiting diarrhea/generalized weakness/dizziness.  Patient received IV fluids dexamethasone.  He feels improved.    Review of Systems  Constitutional: Positive for malaise/fatigue and weight loss. Negative for chills, diaphoresis and fever.  HENT: Negative for nosebleeds and sore throat.   Eyes: Negative for double vision.  Respiratory: Negative for hemoptysis, sputum production and wheezing.   Cardiovascular: Negative for chest pain, palpitations, orthopnea and leg swelling.  Gastrointestinal: Negative for abdominal pain, blood in stool, constipation, diarrhea, heartburn, melena, nausea and vomiting.  Genitourinary: Negative for dysuria, frequency and urgency.  Musculoskeletal: Negative for back pain and joint pain.  Skin: Negative.  Negative for itching and rash.  Neurological: Positive for tingling. Negative for dizziness, focal weakness, weakness and headaches.  Endo/Heme/Allergies:  Does not bruise/bleed easily.  Psychiatric/Behavioral:  Negative for depression. The patient is not nervous/anxious and does not have insomnia.      MEDICAL HISTORY:  Past Medical History:  Diagnosis Date   Cancer [facility])    COPD (chronic obstructive pulmonary disease) ([place])    Hypertension     SURGICAL HISTORY: Past Surgical History:  Procedure Laterality Date   CHOLECYSTECTOMY     Around 2001   COLONOSCOPY WITH PROPOFOL N/A 04/12/2020   Procedure: COLONOSCOPY WITH PROPOFOL;  Surgeon: Robert Bellow, MD;  Location: [facility] ENDOSCOPY;  Service: Endoscopy;  Laterality: N/A;   COLONOSCOPY WITH PROPOFOL N/A 04/24/2020   Procedure: COLONOSCOPY WITH PROPOFOL;  Surgeon: Robert Bellow, MD;  Location: [facility] ENDOSCOPY;  Service: Endoscopy;  Laterality: N/A;   ESOPHAGOGASTRODUODENOSCOPY (EGD) WITH PROPOFOL N/A 04/12/2020   Procedure: ESOPHAGOGASTRODUODENOSCOPY (EGD) WITH PROPOFOL;  Surgeon: Robert Bellow, MD;  Location: [facility] ENDOSCOPY;  Service: Endoscopy;  Laterality: N/A;   EYE SURGERY Bilateral    cataract   LAPAROSCOPIC PARTIAL COLECTOMY Right 05/20/2020   Procedure: LAPAROSCOPIC PARTIAL COLECTOMY;  Surgeon: Robert Bellow, MD;  Location: [facility] ORS;  Service: General;  Laterality: Right;  Floyce Stakes, RNFA to assist   PORTACATH PLACEMENT Left 06/19/2020   Procedure: INSERTION PORT-A-CATH;  Surgeon: Robert Bellow, MD;  Location: [facility] ORS;  Service: General;  Laterality: Left;    SOCIAL HISTORY: Social History   Socioeconomic History   Marital status: Married    Spouse name: Not on file   Number of children: Not on file   Years of education: Not on file   Highest education level: Not on file  Occupational History   Not on file  Tobacco Use   Smoking status: Former Smoker    Years: 3.00    Types: Cigarettes    Quit date: 11/23/1968    Years since quitting: 51.7   Smokeless tobacco: Never Used  Vaping Use   Vaping Use: Never used  Substance and Sexual Activity   Alcohol use: Not Currently   Drug  use: Never   Sexual activity: Not on file  Other Topics Concern   Not on file  Social History Narrative   Lives in [place]; smoked- quit in 70s; beer social; 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; grandson    Social Determinants of Health   Financial Resource Strain:    Difficulty of Paying Living Expenses: Not on file  Food Insecurity:    Worried About Charity fundraiser in the Last Year: Not on file   [employer] of Food in the Last Year: Not on file  Transportation Needs:    Lack of Transportation (Medical): Not on file   Lack of Transportation (Non-Medical): Not on file  Physical Activity:    Days of Exercise per Week: Not on file   Minutes of Exercise per Session: Not on file  Stress:    Feeling of Stress : Not on file  Social Connections:    Frequency of Communication with Friends and Family: Not on file   Frequency of Social Gatherings with Friends and Family: Not on file   Attends Religious Services: Not on file   Active Member of Clubs or Organizations: Not on file   Attends Archivist Meetings: Not on file   Marital Status: Not on file  Intimate Partner Violence:    Fear of Current or Ex-Partner: Not on file   Emotionally Abused: Not on file   Physically Abused: Not on file   Sexually  Abused: Not on file    FAMILY HISTORY: Family History  Problem Relation Age of Onset   Multiple sclerosis Brother     ALLERGIES:  has No Known Allergies.  MEDICATIONS:  Current Outpatient Medications  Medication Sig Dispense Refill   BREO ELLIPTA 100-25 MCG/INH AEPB Inhale 1 puff into the lungs at bedtime.     lidocaine-prilocaine (EMLA) cream Apply 1 application topically as needed. 30 g 3   ondansetron (ZOFRAN) 8 MG tablet One pill every 8 hours as needed for nausea/vomitting. 40 tablet 1   prochlorperazine (COMPAZINE) 10 MG tablet Take 1 tablet (10 mg total) by mouth every 6 (six) hours as needed for nausea or vomiting. 40 tablet 1   tiotropium  (SPIRIVA) 18 MCG inhalation capsule Place 18 mcg into inhaler and inhale at bedtime.      chlorpheniramine-HYDROcodone (TUSSIONEX) 10-8 MG/5ML SUER Take 5 mLs by mouth every 12 (twelve) hours as needed for cough. 140 mL 0   potassium chloride SA (KLOR-CON M20) 20 MEQ tablet TAKE 1 TABLET BY MOUTH TWICE A DAY 30 tablet 3   No current facility-administered medications for this visit.   Facility-Administered Medications Ordered in Other Visits  Medication Dose Route Frequency Provider Last Rate Last Admin   heparin lock flush 100 unit/mL  500 Units Intravenous Once Charlaine Dalton R, MD          .  PHYSICAL EXAMINATION: ECOG PERFORMANCE STATUS: 1 - Symptomatic but completely ambulatory  Vitals:   07/23/20 0844  BP: 91/62  Pulse: 98  Resp: 16  Temp: 98.8 F (37.1 C)  SpO2: 95%   Filed Weights   07/23/20 0844  Weight: 207 lb 12.8 oz (94.3 kg)    Physical Exam Constitutional:      Comments: Elderly male patient.  Patient is in a wheelchair.  Accompanied by his son.  HENT:     Head: Normocephalic and atraumatic.     Mouth/Throat:     Pharynx: No oropharyngeal exudate.  Eyes:     Pupils: Pupils are equal, round, and reactive to light.  Cardiovascular:     Rate and Rhythm: Normal rate and regular rhythm.  Pulmonary:     Effort: No respiratory distress.     Breath sounds: No wheezing.     Comments: Decreased air entry bilaterally.  No wheeze or crackles Abdominal:     General: Bowel sounds are normal. There is no distension.     Palpations: Abdomen is soft. There is no mass.     Tenderness: There is no abdominal tenderness. There is no guarding or rebound.  Musculoskeletal:        General: No tenderness. Normal range of motion.     Cervical back: Normal range of motion and neck supple.  Skin:    General: Skin is warm.  Neurological:     Mental Status: He is alert and oriented to person, place, and time.  Psychiatric:        Mood and Affect: Affect normal.       LABORATORY DATA:  I have reviewed the data as listed Lab Results  Component Value Date   WBC 9.9 07/23/2020   HGB 9.8 (L) 07/23/2020   HCT 33.0 (L) 07/23/2020   MCV 80.9 07/23/2020   PLT 339 07/23/2020   Recent Labs    07/09/20 1327 07/09/20 1327 07/16/20 0846 07/17/20 1307 07/23/20 0829  NA 138   < > 139 143 141  K 3.2*   < > 2.7* 3.7 3.5  CL 106   < > 105 110 109  CO2 26   < > _0 GLUCOSE 99   < > 129* 110* 137*  BUN 14   < > _1 CREATININE 0.79   < > 0.88 0.82 0.81  CALCIUM 8.4*   < > 8.0* 8.4* 8.2*  GFRNONAA >60   < > >60 >60 >60  GFRAA >60   < > >60 >60 >60  PROT 6.7  --  6.6  --  6.4*  ALBUMIN 3.5  --  3.2*  --  2.9*  AST 24  --  33  --  19  ALT 17  --  16  --  22  ALKPHOS 50  --  61  --  64  BILITOT 1.4*  --  1.1  --  1.0   < > = values in this interval not displayed.    RADIOGRAPHIC STUDIES: I have personally reviewed the radiological images as listed and agreed with the findings in the report. No results found.  ASSESSMENT & PLAN:   Cancer of right colon ([place]) #Colon cancer stage III-PT4BN2B- high risk.  PET scan-no evidence of distant site disease.  FOLFOX adjuvant chemotherapy; s/p FOLFOX cycle #1 appx 2 week ago; tolerating poorly- see below.  #Given the poor tolerance-I would recommend only 5-FU continuous infusion.  Discontinue oxaliplatin/leucovorin.  Discussed with the patient. Labs today reviewed;  acceptable for treatment today.  #Severe hypokalemia potassium 2.7-sec to diarrhea/chemo improved at 3.5  # Iron defciency anemia-s/p Venofer; Hb 9.6- STABLE.   # COPD-stable.  Ontinue inhalers as per pulmonary.  # DISPOSITION: # 5FU CIV ONLY; DC- oxaliplatin; leucovorin; pump off d-3; # venofer today # follow up in 2 weeks- MD; labs- cbc/cmp; 5FU CIV only;Venofer- D-3 pump off; Dr.B   All questions were answered. The patient knows to call the clinic with any problems, questions or concerns.    Cammie Sickle,  MD 08/06/2020 7:31 AM

## 2020-07-23 NOTE — Progress Notes (Signed)
0920: Per Dr. Rogue Bussing pt to receive Venofer and Adrucil pump only, no Oxaliplatin or Luecovorin at this time.   1035: Pt tolerated infusion well, No s/s of distress or reaction at this time. Pt and VS stable at discharge.

## 2020-07-23 NOTE — Progress Notes (Signed)
D/c oxal/leucovorin per MD

## 2020-07-23 NOTE — Assessment & Plan Note (Addendum)
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  PET scan-no evidence of distant site disease.  FOLFOX adjuvant chemotherapy; s/p FOLFOX cycle #1 appx 2 week ago; tolerating poorly- see below.  #Given the poor tolerance-I would recommend only 5-FU continuous infusion.  Discontinue oxaliplatin/leucovorin.  Discussed with the patient. Labs today reviewed;  acceptable for treatment today.  #Severe hypokalemia potassium 2.7-sec to diarrhea/chemo improved at 3.5  # Iron defciency anemia-s/p Venofer; Hb 9.6- STABLE.   # COPD-stable.  Ontinue inhalers as per pulmonary.  # DISPOSITION: # 5FU CIV ONLY; DC- oxaliplatin; leucovorin; pump off d-3; # venofer today # follow up in 2 weeks- MD; labs- cbc/cmp; 5FU CIV only;Venofer- D-3 pump off; Dr.B

## 2020-07-24 ENCOUNTER — Telehealth: Payer: Self-pay | Admitting: Internal Medicine

## 2020-07-24 NOTE — Telephone Encounter (Signed)
I left a message for patient's son, Tony-updating pt clinical progress/ modification of chemo regimen.  GB

## 2020-07-25 ENCOUNTER — Inpatient Hospital Stay: Payer: Medicare HMO | Attending: Internal Medicine

## 2020-07-25 ENCOUNTER — Other Ambulatory Visit: Payer: Self-pay

## 2020-07-25 VITALS — BP 104/69 | HR 72 | Temp 96.8°F | Resp 18

## 2020-07-25 DIAGNOSIS — C778 Secondary and unspecified malignant neoplasm of lymph nodes of multiple regions: Secondary | ICD-10-CM | POA: Insufficient documentation

## 2020-07-25 DIAGNOSIS — C182 Malignant neoplasm of ascending colon: Secondary | ICD-10-CM | POA: Diagnosis present

## 2020-07-25 DIAGNOSIS — R05 Cough: Secondary | ICD-10-CM | POA: Insufficient documentation

## 2020-07-25 DIAGNOSIS — J449 Chronic obstructive pulmonary disease, unspecified: Secondary | ICD-10-CM | POA: Diagnosis not present

## 2020-07-25 DIAGNOSIS — Z79899 Other long term (current) drug therapy: Secondary | ICD-10-CM | POA: Diagnosis not present

## 2020-07-25 DIAGNOSIS — Z5111 Encounter for antineoplastic chemotherapy: Secondary | ICD-10-CM | POA: Diagnosis present

## 2020-07-25 DIAGNOSIS — D509 Iron deficiency anemia, unspecified: Secondary | ICD-10-CM | POA: Insufficient documentation

## 2020-07-25 DIAGNOSIS — R5383 Other fatigue: Secondary | ICD-10-CM | POA: Diagnosis not present

## 2020-07-25 DIAGNOSIS — Z8269 Family history of other diseases of the musculoskeletal system and connective tissue: Secondary | ICD-10-CM | POA: Diagnosis not present

## 2020-07-25 DIAGNOSIS — Z87891 Personal history of nicotine dependence: Secondary | ICD-10-CM | POA: Insufficient documentation

## 2020-07-25 DIAGNOSIS — E876 Hypokalemia: Secondary | ICD-10-CM | POA: Insufficient documentation

## 2020-07-25 DIAGNOSIS — Z7189 Other specified counseling: Secondary | ICD-10-CM

## 2020-07-25 MED ORDER — HEPARIN SOD (PORK) LOCK FLUSH 100 UNIT/ML IV SOLN
500.0000 [IU] | Freq: Once | INTRAVENOUS | Status: AC | PRN
  Administered 2020-07-25: 500 [IU]
  Filled 2020-07-25: qty 5

## 2020-07-25 MED ORDER — SODIUM CHLORIDE 0.9% FLUSH
10.0000 mL | INTRAVENOUS | Status: DC | PRN
  Administered 2020-07-25: 10 mL
  Filled 2020-07-25: qty 10

## 2020-07-25 MED ORDER — HEPARIN SOD (PORK) LOCK FLUSH 100 UNIT/ML IV SOLN
INTRAVENOUS | Status: AC
  Filled 2020-07-25: qty 5

## 2020-07-31 ENCOUNTER — Other Ambulatory Visit: Payer: Self-pay | Admitting: *Deleted

## 2020-07-31 MED ORDER — HYDROCOD POLST-CPM POLST ER 10-8 MG/5ML PO SUER: 5.0000 mL | Freq: Two times a day (BID) | ORAL | 0 refills | Status: DC | PRN

## 2020-07-31 NOTE — Telephone Encounter (Signed)
Informed pt of new prescription and how it was ordered to be taken. Also requested pt to call clinic back if cough worsens or he developes dyspnea, fever etc... He states he understands.

## 2020-07-31 NOTE — Telephone Encounter (Signed)
Spoke with Dr. Rogue Bussing - md order to send script for tussinex. Script pended for Dr. Sharmaine Base signature

## 2020-07-31 NOTE — Telephone Encounter (Signed)
Sharyn Lull, will you reach out to patient to determine if he needs evaluation in [facility].

## 2020-07-31 NOTE — Telephone Encounter (Signed)
Patient called to report that he is coughing up moderate amounts of thick green mucus. He is afebrile and drinking adequate amounts of fluid. He stated that the same thing happened the last time he had treatment. He asks if Dr. Rogue Bussing can call him in a prescription for the cough.

## 2020-07-31 NOTE — Telephone Encounter (Signed)
RN called pt back to discuss current symptoms. He states, I am coughing with productive green phlegm that I spit into the trash can. This same exact thing happened the last time I took treatment. No blood in sputum. Denies any dyspnea. No CP. I feel good except for this cough. It is hard to sleep when you are spitting up phlegm. He tried OTC Robitussin cough syrup which has not helped. He has no fevers. He feels fine otherwise. I suggested a visit to [facility] to evaluate. He said he did not have transportation and doesn't know if he has transportation for tomorrow. He said , "Ma'am I just need someone to send something to the pharmacy for this cough." I told pt I would let him know what the provider says.

## 2020-08-05 ENCOUNTER — Other Ambulatory Visit: Payer: Self-pay

## 2020-08-05 ENCOUNTER — Encounter: Payer: Self-pay | Admitting: Internal Medicine

## 2020-08-05 NOTE — Progress Notes (Signed)
Patient reports improvement in cough

## 2020-08-06 ENCOUNTER — Inpatient Hospital Stay (HOSPITAL_BASED_OUTPATIENT_CLINIC_OR_DEPARTMENT_OTHER): Payer: Medicare HMO | Admitting: Internal Medicine

## 2020-08-06 ENCOUNTER — Inpatient Hospital Stay: Payer: Medicare HMO

## 2020-08-06 ENCOUNTER — Other Ambulatory Visit: Payer: Self-pay | Admitting: Internal Medicine

## 2020-08-06 ENCOUNTER — Encounter: Payer: Self-pay | Admitting: Internal Medicine

## 2020-08-06 DIAGNOSIS — C182 Malignant neoplasm of ascending colon: Secondary | ICD-10-CM

## 2020-08-06 DIAGNOSIS — E611 Iron deficiency: Secondary | ICD-10-CM

## 2020-08-06 DIAGNOSIS — Z7189 Other specified counseling: Secondary | ICD-10-CM

## 2020-08-06 DIAGNOSIS — E876 Hypokalemia: Secondary | ICD-10-CM

## 2020-08-06 DIAGNOSIS — Z5111 Encounter for antineoplastic chemotherapy: Secondary | ICD-10-CM | POA: Diagnosis not present

## 2020-08-06 LAB — CBC WITH DIFFERENTIAL/PLATELET
Abs Immature Granulocytes: 0.03 10*3/uL (ref 0.00–0.07)
Basophils Absolute: 0.1 10*3/uL (ref 0.0–0.1)
Basophils Relative: 1 %
Eosinophils Absolute: 0.2 10*3/uL (ref 0.0–0.5)
Eosinophils Relative: 2 %
HCT: 35.5 % — ABNORMAL LOW (ref 39.0–52.0)
Hemoglobin: 10.6 g/dL — ABNORMAL LOW (ref 13.0–17.0)
Immature Granulocytes: 0 %
Lymphocytes Relative: 22 %
Lymphs Abs: 2.5 10*3/uL (ref 0.7–4.0)
MCH: 25.1 pg — ABNORMAL LOW (ref 26.0–34.0)
MCHC: 29.9 g/dL — ABNORMAL LOW (ref 30.0–36.0)
MCV: 84.1 fL (ref 80.0–100.0)
Monocytes Absolute: 1.2 10*3/uL — ABNORMAL HIGH (ref 0.1–1.0)
Monocytes Relative: 10 %
Neutro Abs: 7.6 10*3/uL (ref 1.7–7.7)
Neutrophils Relative %: 65 %
Platelets: 337 10*3/uL (ref 150–400)
RBC: 4.22 MIL/uL (ref 4.22–5.81)
RDW: 27 % — ABNORMAL HIGH (ref 11.5–15.5)
WBC: 11.5 10*3/uL — ABNORMAL HIGH (ref 4.0–10.5)
nRBC: 0 % (ref 0.0–0.2)

## 2020-08-06 LAB — COMPREHENSIVE METABOLIC PANEL
ALT: 35 U/L (ref 0–44)
AST: 39 U/L (ref 15–41)
Albumin: 3.3 g/dL — ABNORMAL LOW (ref 3.5–5.0)
Alkaline Phosphatase: 95 U/L (ref 38–126)
Anion gap: 7 (ref 5–15)
BUN: 14 mg/dL (ref 8–23)
CO2: 28 mmol/L (ref 22–32)
Calcium: 8.8 mg/dL — ABNORMAL LOW (ref 8.9–10.3)
Chloride: 104 mmol/L (ref 98–111)
Creatinine, Ser: 0.84 mg/dL (ref 0.61–1.24)
GFR calc Af Amer: >60 mL/min (ref >60)
GFR calc non Af Amer: >60 mL/min (ref >60)
Glucose, Bld: 103 mg/dL — ABNORMAL HIGH (ref 70–99)
Potassium: 4.8 mmol/L (ref 3.5–5.1)
Sodium: 139 mmol/L (ref 135–145)
Total Bilirubin: 1 mg/dL (ref 0.3–1.2)
Total Protein: 7.2 g/dL (ref 6.5–8.1)

## 2020-08-06 MED ORDER — SODIUM CHLORIDE 0.9 % IV SOLN
2400.0000 mg/m2 | INTRAVENOUS | Status: DC
  Administered 2020-08-06: 5400 mg via INTRAVENOUS
  Filled 2020-08-06: qty 108

## 2020-08-06 MED ORDER — HEPARIN SOD (PORK) LOCK FLUSH 100 UNIT/ML IV SOLN
INTRAVENOUS | Status: AC
  Filled 2020-08-06: qty 5

## 2020-08-06 MED ORDER — SODIUM CHLORIDE 0.9 % IV SOLN: 200.0000 mg | Freq: Once | INTRAVENOUS | Status: DC

## 2020-08-06 MED ORDER — IRON SUCROSE 20 MG/ML IV SOLN
200.0000 mg | Freq: Once | INTRAVENOUS | Status: AC
  Administered 2020-08-06: 200 mg via INTRAVENOUS
  Filled 2020-08-06: qty 10

## 2020-08-06 MED ORDER — SODIUM CHLORIDE 0.9 % IV SOLN
10.0000 mg | Freq: Once | INTRAVENOUS | Status: AC
  Administered 2020-08-06: 10 mg via INTRAVENOUS
  Filled 2020-08-06: qty 10

## 2020-08-06 MED ORDER — SODIUM CHLORIDE 0.9 % IV SOLN
Freq: Once | INTRAVENOUS | Status: AC
  Filled 2020-08-06: qty 250

## 2020-08-06 MED ORDER — PREDNISONE 20 MG PO TABS: ORAL_TABLET | ORAL | 0 refills | Status: DC

## 2020-08-06 MED ORDER — HYDROCOD POLST-CPM POLST ER 10-8 MG/5ML PO SUER
5.0000 mL | Freq: Every evening | ORAL | 0 refills | Status: DC | PRN
Start: 2020-08-06 — End: 2020-10-01

## 2020-08-06 MED ORDER — SODIUM CHLORIDE 0.9% FLUSH
10.0000 mL | Freq: Once | INTRAVENOUS | Status: AC
  Administered 2020-08-06: 10 mL via INTRAVENOUS
  Filled 2020-08-06: qty 10

## 2020-08-06 NOTE — Progress Notes (Signed)
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   # AUG 10th, 2021- FOLFOX [adjuvant]  -------------------------------------------------------------------------------------------   - INVASIVE MODERATELY DIFFERENTIATED ADENOCARCINOMA.  - TEN OF FIFTY-TWO LYMPH NODES POSITIVE FOR METASTATIC ADENOCARCINOMA  (10/52).  - SINGLE INCIDENTAL TUBULOVILLOUS ADENOMA.  - THREE INCIDENTAL TUBULAR ADENOMAS.  - SEE CANCER SUMMARY.   CANCER CASE SUMMARY: COLON AND RECTUM  Procedure: Right hemicolectomy  Tumor Site: Cecum  Tumor Size: Greatest dimension: 12.2 cm  Macroscopic Tumor Perforation: Present  Histologic Type: Adenocarcinoma  Histologic Grade: G2: Moderately differentiated  Tumor Extension: Tumor directly invades adjacent structures (terminal  ileum)  Margins: All margins are uninvolved by invasive carcinoma, high-grade  dysplasia/intramucosal adenocarcinoma, and low-grade dysplasia  Treatment Effect: No known presurgical therapy  Lymphovascular Invasion: Present  Perineural Invasion: Present  Tumor Deposits: Present: Multiple  Regional Lymph Nodes:       Number of Lymph Nodes Involved: 10       Number of Lymph Nodes Examined: 40  -  # COPD- [James Hodge];   # SURVIVORSHIP:   # GENETICS:   DIAGNOSIS: colon cancer  STAGE:   III      ;  GOALS:cure  CURRENT/MOST RECENT THERAPY : FOLFOX     Cancer of right colon ([place])  05/20/2020 Initial Diagnosis   Cancer of right colon ([place])   07/02/2020 -  Chemotherapy   The patient had dexamethasone (DECADRON) 4 MG tablet, 8 mg,  Oral, Daily, 1 of 1 cycle, Start date: --, End date: -- palonosetron (ALOXI) injection 0.25 mg, 0.25 mg, Intravenous,  Once, 1 of 1 cycle Administration: 0.25 mg (07/02/2020) leucovorin 904 mg in dextrose 5 % 250 mL infusion, 400 mg/m2 = 904 mg, Intravenous,  Once, 1 of 1 cycle oxaliplatin (ELOXATIN) 190 mg in dextrose 5 % 500 mL chemo infusion, 85 mg/m2 = 190 mg, Intravenous,  Once, 1 of 1 cycle Administration: 190 mg (07/02/2020) fluorouracil (ADRUCIL) 5,400 mg in sodium chloride 0.9 % 142 mL chemo infusion, 2,400 mg/m2 = 5,400 mg, Intravenous, 1 Day/Dose, 3 of 12 cycles Administration: 5,400 mg (07/02/2020), 5,400 mg (07/23/2020), 5,400 mg (08/06/2020)  for chemotherapy treatment.       HISTORY OF PRESENTING ILLNESS:  James Hodge 74 y.o.  male stage III colon cancer-high risk currently on adjuvant continuous 5-FU infusion is here for follow-up.  Patient states his cough is improved not resolved.  Continues to use his inhalers.  Patient denies any tingling or numbness.  Denies any fevers or chills.  No dizzy spells but no falls.   Review of Systems  Constitutional: Positive for malaise/fatigue. Negative for chills, diaphoresis and fever.  HENT: Negative for nosebleeds and sore throat.   Eyes: Negative for double vision.  Respiratory: Positive for cough. Negative for hemoptysis, sputum production and wheezing.   Cardiovascular: Negative for chest pain, palpitations, orthopnea and leg swelling.  Gastrointestinal: Negative for abdominal pain, blood in stool, constipation, diarrhea, heartburn, melena, nausea and vomiting.  Genitourinary: Negative for dysuria, frequency and urgency.  Musculoskeletal: Negative for back pain and joint pain.  Skin: Negative.  Negative for itching and rash.  Neurological: Negative for dizziness, focal weakness, weakness  and headaches.  Endo/Heme/Allergies: Does not bruise/bleed easily.  Psychiatric/Behavioral: Negative for depression. The patient is not  nervous/anxious and does not have insomnia.      MEDICAL HISTORY:  Past Medical History:  Diagnosis Date  . Cancer ([place])   . COPD (chronic obstructive pulmonary disease) ([place])   . Hypertension     SURGICAL HISTORY: Past Surgical History:  Procedure Laterality Date  . CHOLECYSTECTOMY     Around 2001  . COLONOSCOPY WITH PROPOFOL N/A 04/12/2020   Procedure: COLONOSCOPY WITH PROPOFOL;  Surgeon: James Bellow, MD;  Location: [facility] ENDOSCOPY;  Service: Endoscopy;  Laterality: N/A;  . COLONOSCOPY WITH PROPOFOL N/A 04/24/2020   Procedure: COLONOSCOPY WITH PROPOFOL;  Surgeon: James Bellow, MD;  Location: [facility] ENDOSCOPY;  Service: Endoscopy;  Laterality: N/A;  . ESOPHAGOGASTRODUODENOSCOPY (EGD) WITH PROPOFOL N/A 04/12/2020   Procedure: ESOPHAGOGASTRODUODENOSCOPY (EGD) WITH PROPOFOL;  Surgeon: James Bellow, MD;  Location: [facility] ENDOSCOPY;  Service: Endoscopy;  Laterality: N/A;  . EYE SURGERY Bilateral    cataract  . LAPAROSCOPIC PARTIAL COLECTOMY Right 05/20/2020   Procedure: LAPAROSCOPIC PARTIAL COLECTOMY;  Surgeon: James Bellow, MD;  Location: [facility] ORS;  Service: General;  Laterality: Right;  James Hodge, RNFA to assist  . PORTACATH PLACEMENT Left 06/19/2020   Procedure: INSERTION PORT-A-CATH;  Surgeon: James Bellow, MD;  Location: [facility] ORS;  Service: General;  Laterality: Left;    SOCIAL HISTORY: Social History   Socioeconomic History  . Marital status: Married    Spouse name: Not on file  . Number of children: Not on file  . Years of education: Not on file  . Highest education level: Not on file  Occupational History  . Not on file  Tobacco Use  . Smoking status: Former Smoker    Years: 3.00    Types: Cigarettes    Quit date: 11/23/1968    Years since quitting: 51.7  . Smokeless tobacco: Never Used  Vaping Use  . Vaping Use: Never used  Substance and Sexual Activity  . Alcohol use: Not Currently  . Drug use: Never  . Sexual activity: Not on file   Other Topics Concern  . Not on file  Social History Narrative   L[place] in [place]; smoked- quit in 70s; beer social; 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; grandson    Social Determinants of Health   Financial Resource Strain:   . Difficulty of Paying Living Expenses: Not on file  Food Insecurity:   . Worried About Charity fundraiser in the Last Year: Not on file  . Ran Out of Food in the Last Year: Not on file  Transportation Needs:   . Lack of Transportation (Medical): Not on file  . Lack of Transportation (Non-Medical): Not on file  Physical Activity:   . Days of Exercise per Week: Not on file  . Minutes of Exercise per Session: Not on file  Stress:   . Feeling of Stress : Not on file  Social Connections:   . Frequency of Communication with Friends and Family: Not on file  . Frequency of Social Gatherings with Friends and Family: Not on file  . Attends Religious Services: Not on file  . Active Member of Clubs or Organizations: Not on file  . Attends Archivist Meetings: Not on file  . Marital Status: Not on file  Intimate Partner Violence:   . Fear of Current or Ex-Partner: Not on file  . Emotionally Abused: Not on file  . Physically Abused: Not on  file  . Sexually Abused: Not on file    FAMILY HISTORY: Family History  Problem Relation Age of Onset  . Multiple sclerosis Brother     ALLERGIES:  has No Known Allergies.  MEDICATIONS:  Current Outpatient Medications  Medication Sig Dispense Refill  . BREO ELLIPTA 100-25 MCG/INH AEPB Inhale 1 puff into the lungs at bedtime.    . chlorpheniramine-HYDROcodone (TUSSIONEX) 10-8 MG/5ML SUER Take 5 mLs by mouth at bedtime as needed for cough. 140 mL 0  . lidocaine-prilocaine (EMLA) cream Apply 1 application topically as needed. 30 g 3  . ondansetron (ZOFRAN) 8 MG tablet One pill every 8 hours as needed for nausea/vomitting. 40 tablet 1  . prochlorperazine (COMPAZINE) 10 MG tablet Take 1 tablet (10 mg total) by mouth every  6 (six) hours as needed for nausea or vomiting. 40 tablet 1  . tiotropium (SPIRIVA) 18 MCG inhalation capsule Place 18 mcg into inhaler and inhale at bedtime.     . potassium chloride SA (KLOR-CON M20) 20 MEQ tablet TAKE 1 TABLET BY MOUTH TWICE A DAY 30 tablet 3  . predniSONE (DELTASONE) 20 MG tablet Take 3 pills once a day x 3 days; 2 pills/day for 3 days; and the once a day. Take with Breakfast. 30 tablet 0   No current facility-administered medications for this visit.   Facility-Administered Medications Ordered in Other Visits  Medication Dose Route Frequency Provider Last Rate Last Admin  . fluorouracil (ADRUCIL) 5,400 mg in sodium chloride 0.9 % 142 mL chemo infusion  2,400 mg/m2 (Treatment Plan Recorded) Intravenous 1 day or 1 dose Charlaine Dalton R, MD   5,400 mg at 08/06/20 1134  . heparin lock flush 100 unit/mL  500 Units Intravenous Once Charlaine Dalton R, MD          .  PHYSICAL EXAMINATION: ECOG PERFORMANCE STATUS: 1 - Symptomatic but completely ambulatory  Vitals:   08/06/20 0906  BP: 122/71  Pulse: 78  Resp: 16  Temp: 98 F (36.7 C)  SpO2: 94%   Filed Weights   08/06/20 0906  Weight: 207 lb (93.9 kg)    Physical Exam Constitutional:      Comments: Elderly male patient.  Patient is in a wheelchair.  Accompanied by his son.  HENT:     Head: Normocephalic and atraumatic.     Mouth/Throat:     Pharynx: No oropharyngeal exudate.  Eyes:     Pupils: Pupils are equal, round, and reactive to light.  Cardiovascular:     Rate and Rhythm: Normal rate and regular rhythm.  Pulmonary:     Effort: No respiratory distress.     Breath sounds: No wheezing.     Comments: Decreased air entry bilaterally.  No wheeze or crackles Abdominal:     General: Bowel sounds are normal. There is no distension.     Palpations: Abdomen is soft. There is no mass.     Tenderness: There is no abdominal tenderness. There is no guarding or rebound.  Musculoskeletal:        General:  No tenderness. Normal range of motion.     Cervical back: Normal range of motion and neck supple.  Skin:    General: Skin is warm.  Neurological:     Mental Status: He is alert and oriented to person, place, and time.  Psychiatric:        Mood and Affect: Affect normal.      LABORATORY DATA:  I have reviewed the data as listed Lab  Results  Component Value Date   WBC 11.5 (H) 08/06/2020   HGB 10.6 (L) 08/06/2020   HCT 35.5 (L) 08/06/2020   MCV 84.1 08/06/2020   PLT 337 08/06/2020   Recent Labs    07/16/20 0846 07/16/20 0846 07/17/20 1307 07/23/20 0829 08/06/20 0852  NA 139   < > 143 141 139  K 2.7*   < > 3.7 3.5 4.8  CL 105   < > 110 109 104  CO2 24   < > _0 GLUCOSE 129*   < > 110* 137* 103*  BUN 16   < > _1 CREATININE 0.88   < > 0.82 0.81 0.84  CALCIUM 8.0*   < > 8.4* 8.2* 8.8*  GFRNONAA >60   < > >60 >60 >60  GFRAA >60   < > >60 >60 >60  PROT 6.6  --   --  6.4* 7.2  ALBUMIN 3.2*  --   --  2.9* 3.3*  AST 33  --   --  19 39  ALT 16  --   --  22 35  ALKPHOS 61  --   --  64 95  BILITOT 1.1  --   --  1.0 1.0   < > = values in this interval not displayed.    RADIOGRAPHIC STUDIES: I have personally reviewed the radiological images as listed and agreed with the findings in the report. No results found.  ASSESSMENT & PLAN:   Cancer of right colon ([place]) #Colon cancer stage III-PT4BN2B- high risk.  PET scan-no evidence of distant site disease. On adjuvant CIV 5FU.   # proceed with CIV 5FU. Labs today reviewed;  acceptable for treatment today.   #Severe hypokalemia potassium 2.7-sec to diarrhea/chemo improved at 3.5.  # Iron defciency anemia-s/p Venofer; Hb 10.5 STABLE>   # COPD-STABLE; but cough not resolved;continue inhalers as per pulmonary. refilled Tussinex. Add prednisone.    # DISPOSITION: # 5FU CIV  pump off d-3; # venofer today # follow up in 2 weeks- MD; labs- cbc/cmp; 5FU CIV only;Venofer- D-3 pump off; Dr.B   All questions were  answered. The patient knows to call the clinic with any problems, questions or concerns.    James Sickle, MD 08/06/2020 12:04 PM

## 2020-08-06 NOTE — Assessment & Plan Note (Signed)
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  PET scan-no evidence of distant site disease. On adjuvant CIV 5FU.   # proceed with CIV 5FU. Labs today reviewed;  acceptable for treatment today.   #Severe hypokalemia potassium 2.7-sec to diarrhea/chemo improved at 3.5.  # Iron defciency anemia-s/p Venofer; Hb 10.5 STABLE>   # COPD-STABLE; but cough not resolved;continue inhalers as per pulmonary. refilled Tussinex. Add prednisone.    # DISPOSITION: # 5FU CIV  pump off d-3; # venofer today # follow up in 2 weeks- MD; labs- cbc/cmp; 5FU CIV only;Venofer- D-3 pump off; Dr.B

## 2020-08-08 ENCOUNTER — Inpatient Hospital Stay: Payer: Medicare HMO

## 2020-08-08 ENCOUNTER — Other Ambulatory Visit: Payer: Self-pay

## 2020-08-08 DIAGNOSIS — Z7189 Other specified counseling: Secondary | ICD-10-CM

## 2020-08-08 DIAGNOSIS — Z5111 Encounter for antineoplastic chemotherapy: Secondary | ICD-10-CM | POA: Diagnosis not present

## 2020-08-08 DIAGNOSIS — C182 Malignant neoplasm of ascending colon: Secondary | ICD-10-CM

## 2020-08-08 MED ORDER — HEPARIN SOD (PORK) LOCK FLUSH 100 UNIT/ML IV SOLN
500.0000 [IU] | Freq: Once | INTRAVENOUS | Status: AC | PRN
  Administered 2020-08-08: 500 [IU]
  Filled 2020-08-08: qty 5

## 2020-08-08 MED ORDER — HEPARIN SOD (PORK) LOCK FLUSH 100 UNIT/ML IV SOLN
INTRAVENOUS | Status: AC
  Filled 2020-08-08: qty 5

## 2020-08-08 MED ORDER — SODIUM CHLORIDE 0.9% FLUSH
10.0000 mL | INTRAVENOUS | Status: DC | PRN
  Administered 2020-08-08: 10 mL
  Filled 2020-08-08: qty 10

## 2020-08-14 ENCOUNTER — Telehealth: Payer: Self-pay | Admitting: *Deleted

## 2020-08-14 NOTE — Telephone Encounter (Signed)
Contacted patient to follow-up on his toleration of the  chemotherapy (cycle #3). He has felt much better for this cycle- Dr. Rogue Bussing held the oxaliplatin at the last dosing. He states that his cough has resolved. He has no nausea/vomiting or diarrhea/ or constipation.no bladder concerns. His next appointment - for cycle #4 is on Tuesday next week.

## 2020-08-20 ENCOUNTER — Other Ambulatory Visit: Payer: Self-pay

## 2020-08-20 ENCOUNTER — Other Ambulatory Visit: Payer: Self-pay | Admitting: Internal Medicine

## 2020-08-20 ENCOUNTER — Ambulatory Visit: Payer: Medicare HMO

## 2020-08-20 ENCOUNTER — Inpatient Hospital Stay: Payer: Medicare HMO

## 2020-08-20 ENCOUNTER — Inpatient Hospital Stay (HOSPITAL_BASED_OUTPATIENT_CLINIC_OR_DEPARTMENT_OTHER): Payer: Medicare HMO | Admitting: Internal Medicine

## 2020-08-20 DIAGNOSIS — Z7189 Other specified counseling: Secondary | ICD-10-CM

## 2020-08-20 DIAGNOSIS — C182 Malignant neoplasm of ascending colon: Secondary | ICD-10-CM

## 2020-08-20 DIAGNOSIS — E876 Hypokalemia: Secondary | ICD-10-CM

## 2020-08-20 DIAGNOSIS — Z5111 Encounter for antineoplastic chemotherapy: Secondary | ICD-10-CM | POA: Diagnosis not present

## 2020-08-20 DIAGNOSIS — E611 Iron deficiency: Secondary | ICD-10-CM

## 2020-08-20 LAB — COMPREHENSIVE METABOLIC PANEL
ALT: 22 U/L (ref 0–44)
AST: 26 U/L (ref 15–41)
Albumin: 3.4 g/dL — ABNORMAL LOW (ref 3.5–5.0)
Alkaline Phosphatase: 59 U/L (ref 38–126)
Anion gap: 6 (ref 5–15)
BUN: 14 mg/dL (ref 8–23)
CO2: 25 mmol/L (ref 22–32)
Calcium: 8.8 mg/dL — ABNORMAL LOW (ref 8.9–10.3)
Chloride: 107 mmol/L (ref 98–111)
Creatinine, Ser: 0.92 mg/dL (ref 0.61–1.24)
GFR calc Af Amer: >60 mL/min (ref >60)
GFR calc non Af Amer: >60 mL/min (ref >60)
Glucose, Bld: 101 mg/dL — ABNORMAL HIGH (ref 70–99)
Potassium: 4.2 mmol/L (ref 3.5–5.1)
Sodium: 138 mmol/L (ref 135–145)
Total Bilirubin: 0.9 mg/dL (ref 0.3–1.2)
Total Protein: 6.7 g/dL (ref 6.5–8.1)

## 2020-08-20 LAB — CBC WITH DIFFERENTIAL/PLATELET
Abs Immature Granulocytes: 0.03 10*3/uL (ref 0.00–0.07)
Basophils Absolute: 0.1 10*3/uL (ref 0.0–0.1)
Basophils Relative: 1 %
Eosinophils Absolute: 0.4 10*3/uL (ref 0.0–0.5)
Eosinophils Relative: 4 %
HCT: 36 % — ABNORMAL LOW (ref 39.0–52.0)
Hemoglobin: 11.2 g/dL — ABNORMAL LOW (ref 13.0–17.0)
Immature Granulocytes: 0 %
Lymphocytes Relative: 19 %
Lymphs Abs: 1.8 10*3/uL (ref 0.7–4.0)
MCH: 26.7 pg (ref 26.0–34.0)
MCHC: 31.1 g/dL (ref 30.0–36.0)
MCV: 85.7 fL (ref 80.0–100.0)
Monocytes Absolute: 1.1 10*3/uL — ABNORMAL HIGH (ref 0.1–1.0)
Monocytes Relative: 11 %
Neutro Abs: 6.1 10*3/uL (ref 1.7–7.7)
Neutrophils Relative %: 65 %
Platelets: 247 10*3/uL (ref 150–400)
RBC: 4.2 MIL/uL — ABNORMAL LOW (ref 4.22–5.81)
RDW: 26.3 % — ABNORMAL HIGH (ref 11.5–15.5)
WBC: 9.4 10*3/uL (ref 4.0–10.5)
nRBC: 0 % (ref 0.0–0.2)

## 2020-08-20 MED ORDER — IRON SUCROSE 20 MG/ML IV SOLN
200.0000 mg | Freq: Once | INTRAVENOUS | Status: AC
  Administered 2020-08-20: 200 mg via INTRAVENOUS
  Filled 2020-08-20: qty 10

## 2020-08-20 MED ORDER — SODIUM CHLORIDE 0.9 % IV SOLN: 200.0000 mg | Freq: Once | INTRAVENOUS | Status: DC

## 2020-08-20 MED ORDER — SODIUM CHLORIDE 0.9 % IV SOLN
INTRAVENOUS | Status: DC
  Filled 2020-08-20: qty 250

## 2020-08-20 MED ORDER — SODIUM CHLORIDE 0.9% FLUSH
10.0000 mL | Freq: Once | INTRAVENOUS | Status: AC
  Administered 2020-08-20: 10 mL via INTRAVENOUS
  Filled 2020-08-20: qty 10

## 2020-08-20 MED ORDER — SODIUM CHLORIDE 0.9 % IV SOLN
2400.0000 mg/m2 | INTRAVENOUS | Status: DC
  Administered 2020-08-20: 5400 mg via INTRAVENOUS
  Filled 2020-08-20: qty 108

## 2020-08-20 MED ORDER — SODIUM CHLORIDE 0.9 % IV SOLN
10.0000 mg | Freq: Once | INTRAVENOUS | Status: AC
  Administered 2020-08-20: 10 mg via INTRAVENOUS
  Filled 2020-08-20: qty 10

## 2020-08-20 NOTE — Assessment & Plan Note (Addendum)
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  PET scan-no evidence of distant site disease. On adjuvant CIV 5FU.   # proceed with CIV 5FU. Labs today reviewed;  acceptable for treatment today.   #Severe hypokalemia potassium 2.7-sec to diarrhea/chemo improved at 4.3.   # Iron defciency anemia-s/p Venofer; Hb 11.2; STABLE.    # COPD- STABLE; continue inhalers.   # DISPOSITION: # 5FU CIV  pump off d-3; # venofer today # follow up in 2 weeks- MD; labs- cbc/cmp; 5FU CIV only;Venofer- D-3 pump off; Dr.B

## 2020-08-20 NOTE — Progress Notes (Signed)
[facility] NOTE  Patient Care Team: Albina Billet, MD as PCP - General (Internal Medicine) Clent Jacks, RN as Oncology Nurse Navigator Cammie Sickle, MD as Consulting Physician (Hematology and Oncology)  CHIEF COMPLAINTS/PURPOSE OF CONSULTATION: Colon cancer  #  Oncology History Overview Note  # July 2021- RIGHT COLON ADENO CARCINOMA- Dr.Byrnett s/p right hemicolectomy- pT4b N2b- STAGE III [high risk]; MSI-STABLE.   # AUG 10th, 2021- FOLFOX [adjuvant]  -------------------------------------------------------------------------------------------   - INVASIVE MODERATELY DIFFERENTIATED ADENOCARCINOMA.  - TEN OF FIFTY-TWO LYMPH NODES POSITIVE FOR METASTATIC ADENOCARCINOMA  (10/52).  - SINGLE INCIDENTAL TUBULOVILLOUS ADENOMA.  - THREE INCIDENTAL TUBULAR ADENOMAS.  - SEE CANCER SUMMARY.   CANCER CASE SUMMARY: COLON AND RECTUM  Procedure: Right hemicolectomy  Tumor Site: Cecum  Tumor Size: Greatest dimension: 12.2 cm  Macroscopic Tumor Perforation: Present  Histologic Type: Adenocarcinoma  Histologic Grade: G2: Moderately differentiated  Tumor Extension: Tumor directly invades adjacent structures (terminal  ileum)  Margins: All margins are uninvolved by invasive carcinoma, high-grade  dysplasia/intramucosal adenocarcinoma, and low-grade dysplasia  Treatment Effect: No known presurgical therapy  Lymphovascular Invasion: Present  Perineural Invasion: Present  Tumor Deposits: Present: Multiple  Regional Lymph Nodes:       Number of Lymph Nodes Involved: 10       Number of Lymph Nodes Examined: 57  -  # COPD- [Dr.Fleming];   # SURVIVORSHIP:   # GENETICS:   DIAGNOSIS: colon cancer  STAGE:   III      ;  GOALS:cure  CURRENT/MOST RECENT THERAPY : FOLFOX     Cancer of right colon ([place])  05/20/2020 Initial Diagnosis   Cancer of right colon ([place])   07/02/2020 -  Chemotherapy   The patient had dexamethasone (DECADRON) 4 MG tablet, 8 mg,  Oral, Daily, 1 of 1 cycle, Start date: --, End date: -- palonosetron (ALOXI) injection 0.25 mg, 0.25 mg, Intravenous,  Once, 1 of 1 cycle Administration: 0.25 mg (07/02/2020) leucovorin 904 mg in dextrose 5 % 250 mL infusion, 400 mg/m2 = 904 mg, Intravenous,  Once, 1 of 1 cycle oxaliplatin (ELOXATIN) 190 mg in dextrose 5 % 500 mL chemo infusion, 85 mg/m2 = 190 mg, Intravenous,  Once, 1 of 1 cycle Administration: 190 mg (07/02/2020) fluorouracil (ADRUCIL) 5,400 mg in sodium chloride 0.9 % 142 mL chemo infusion, 2,400 mg/m2 = 5,400 mg, Intravenous, 1 Day/Dose, 4 of 12 cycles Administration: 5,400 mg (07/02/2020), 5,400 mg (07/23/2020), 5,400 mg (08/06/2020), 5,400 mg (08/20/2020)  for chemotherapy treatment.       HISTORY OF PRESENTING ILLNESS:  James Hodge 74 y.o.  male stage III colon cancer-high risk currently on adjuvant continuous 5-FU infusion is here for follow-up.  Patient denies any worsening diarrhea.  Denies any tingling or numbness.  Denies any wheezing.  No dizzy spells.  No falls.  Continues to have chronic mild shortness of breath/cough.   Review of Systems  Constitutional: Positive for malaise/fatigue. Negative for chills, diaphoresis and fever.  HENT: Negative for nosebleeds and sore throat.   Eyes: Negative for double vision.  Respiratory: Positive for cough and shortness of breath. Negative for hemoptysis, sputum production and wheezing.   Cardiovascular: Negative for chest pain, palpitations, orthopnea and leg swelling.  Gastrointestinal: Negative for abdominal pain, blood in stool, constipation, diarrhea, heartburn, melena, nausea and vomiting.  Genitourinary: Negative for dysuria, frequency and urgency.  Musculoskeletal: Negative for back pain and joint pain.  Skin: Negative.  Negative for itching and rash.  Neurological: Negative for  dizziness, focal weakness, weakness and headaches.  Endo/Heme/Allergies: Does not bruise/bleed easily.  Psychiatric/Behavioral:  Negative for depression. The patient is not nervous/anxious and does not have insomnia.      MEDICAL HISTORY:  Past Medical History:  Diagnosis Date  . Cancer ([place])   . COPD (chronic obstructive pulmonary disease) ([place])   . Hypertension     SURGICAL HISTORY: Past Surgical History:  Procedure Laterality Date  . CHOLECYSTECTOMY     Around 2001  . COLONOSCOPY WITH PROPOFOL N/A 04/12/2020   Procedure: COLONOSCOPY WITH PROPOFOL;  Surgeon: Robert Bellow, MD;  Location: [facility] ENDOSCOPY;  Service: Endoscopy;  Laterality: N/A;  . COLONOSCOPY WITH PROPOFOL N/A 04/24/2020   Procedure: COLONOSCOPY WITH PROPOFOL;  Surgeon: Robert Bellow, MD;  Location: [facility] ENDOSCOPY;  Service: Endoscopy;  Laterality: N/A;  . ESOPHAGOGASTRODUODENOSCOPY (EGD) WITH PROPOFOL N/A 04/12/2020   Procedure: ESOPHAGOGASTRODUODENOSCOPY (EGD) WITH PROPOFOL;  Surgeon: Robert Bellow, MD;  Location: [facility] ENDOSCOPY;  Service: Endoscopy;  Laterality: N/A;  . EYE SURGERY Bilateral    cataract  . LAPAROSCOPIC PARTIAL COLECTOMY Right 05/20/2020   Procedure: LAPAROSCOPIC PARTIAL COLECTOMY;  Surgeon: Robert Bellow, MD;  Location: [facility] ORS;  Service: General;  Laterality: Right;  Floyce Stakes, RNFA to assist  . PORTACATH PLACEMENT Left 06/19/2020   Procedure: INSERTION PORT-A-CATH;  Surgeon: Robert Bellow, MD;  Location: [facility] ORS;  Service: General;  Laterality: Left;    SOCIAL HISTORY: Social History   Socioeconomic History  . Marital status: Married    Spouse name: Not on file  . Number of children: Not on file  . Years of education: Not on file  . Highest education level: Not on file  Occupational History  . Not on file  Tobacco Use  . Smoking status: Former Smoker    Years: 3.00    Types: Cigarettes    Quit date: 11/23/1968    Years since quitting: 51.7  . Smokeless tobacco: Never Used  Vaping Use  . Vaping Use: Never used  Substance and Sexual Activity  . Alcohol use: Not Currently  . Drug  use: Never  . Sexual activity: Not on file  Other Topics Concern  . 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 Difficulty of Paying Living Expenses: Not on file  Food Insecurity:   . Worried About Charity fundraiser in the Last Year: Not on file  . Ran Out of Food in the Last Year: Not on file  Transportation Needs:   . Lack of Transportation (Medical): Not on file  . Lack of Transportation (Non-Medical): Not on file  Physical Activity:   . Days of Exercise per Week: Not on file  . Minutes of Exercise per Session: Not on file  Stress:   . Feeling of Stress : Not on file  Social Connections:   . Frequency of Communication with Friends and Family: Not on file  . Frequency of Social Gatherings with Friends and Family: Not on file  . Attends Religious Services: Not on file  . Active Member of Clubs or Organizations: Not on file  . Attends Archivist Meetings: Not on file  . Marital Status: Not on file  Intimate Partner Violence:   . Fear of Current or Ex-Partner: Not on file  . Emotionally Abused: Not on file  .  Physically Abused: Not on file  . Sexually Abused: Not on file    FAMILY HISTORY: Family History  Problem Relation Age of Onset  . Multiple sclerosis Brother     ALLERGIES:  has No Known Allergies.  MEDICATIONS:  Current Outpatient Medications  Medication Sig Dispense Refill  . BREO ELLIPTA 100-25 MCG/INH AEPB Inhale 1 puff into the lungs at bedtime.    . chlorpheniramine-HYDROcodone (TUSSIONEX) 10-8 MG/5ML SUER Take 5 mLs by mouth at bedtime as needed for cough. 140 mL 0  . lidocaine-prilocaine (EMLA) cream Apply 1 application topically as needed. 30 g 3  . ondansetron (ZOFRAN) 8 MG tablet One pill every 8 hours as needed for nausea/vomitting. 40 tablet 1  . potassium chloride SA (KLOR-CON M20) 20  MEQ tablet TAKE 1 TABLET BY MOUTH TWICE A DAY 30 tablet 3  . predniSONE (DELTASONE) 20 MG tablet Take 3 pills once a day x 3 days; 2 pills/day for 3 days; and the once a day. Take with Breakfast. 30 tablet 0  . prochlorperazine (COMPAZINE) 10 MG tablet Take 1 tablet (10 mg total) by mouth every 6 (six) hours as needed for nausea or vomiting. 40 tablet 1  . tiotropium (SPIRIVA) 18 MCG inhalation capsule Place 18 mcg into inhaler and inhale at bedtime.      No current facility-administered medications for this visit.   Facility-Administered Medications Ordered in Other Visits  Medication Dose Route Frequency Provider Last Rate Last Admin  . 0.9 %  sodium chloride infusion   Intravenous Continuous Cammie Sickle, MD   Stopped at 08/20/20 1044  . fluorouracil (ADRUCIL) 5,400 mg in sodium chloride 0.9 % 142 mL chemo infusion  2,400 mg/m2 (Treatment Plan Recorded) Intravenous 1 day or 1 dose Charlaine Dalton R, MD   5,400 mg at 08/20/20 1043  . heparin lock flush 100 unit/mL  500 Units Intravenous Once Charlaine Dalton R, MD          .  PHYSICAL EXAMINATION: ECOG PERFORMANCE STATUS: 1 - Symptomatic but completely ambulatory  Vitals:   08/20/20 0834  BP: (!) 110/55  Pulse: (!) 58  Resp: 16  Temp: (!) 96.9 F (36.1 C)  SpO2: 98%   Filed Weights   08/20/20 0834  Weight: 209 lb (94.8 kg)    Physical Exam Constitutional:      Comments: Elderly male patient.  Patient is in a wheelchair.  Accompanied by his son.  HENT:     Head: Normocephalic and atraumatic.     Mouth/Throat:     Pharynx: No oropharyngeal exudate.  Eyes:     Pupils: Pupils are equal, round, and reactive to light.  Cardiovascular:     Rate and Rhythm: Normal rate and regular rhythm.  Pulmonary:     Effort: No respiratory distress.     Breath sounds: No wheezing.     Comments: Decreased air entry bilaterally.  No wheeze or crackles Abdominal:     General: Bowel sounds are normal. There is no  distension.     Palpations: Abdomen is soft. There is no mass.     Tenderness: There is no abdominal tenderness. There is no guarding or rebound.  Musculoskeletal:        General: No tenderness. Normal range of motion.     Cervical back: Normal range of motion and neck supple.  Skin:    General: Skin is warm.  Neurological:     Mental Status: He is alert and oriented to person, place, and time.  Psychiatric:        Mood and Affect: Affect normal.      LABORATORY DATA:  I have reviewed the data as listed Lab Results  Component Value Date   WBC 9.4 08/20/2020   HGB 11.2 (L) 08/20/2020   HCT 36.0 (L) 08/20/2020   MCV 85.7 08/20/2020   PLT 247 08/20/2020   Recent Labs    07/23/20 0829 08/06/20 0852 08/20/20 0821  NA 141 139 138  K 3.5 4.8 4.2  CL 109 104 107  CO2 _0 GLUCOSE 137* 103* 101*  BUN _1 CREATININE 0.81 0.84 0.92  CALCIUM 8.2* 8.8* 8.8*  GFRNONAA >60 >60 >60  GFRAA >60 >60 >60  PROT 6.4* 7.2 6.7  ALBUMIN 2.9* 3.3* 3.4*  AST 19 39 26  ALT 22 35 22  ALKPHOS 64 95 59  BILITOT 1.0 1.0 0.9    RADIOGRAPHIC STUDIES: I have personally reviewed the radiological images as listed and agreed with the findings in the report. No results found.  ASSESSMENT & PLAN:   Cancer of right colon ([place]) #Colon cancer stage III-PT4BN2B- high risk.  PET scan-no evidence of distant site disease. On adjuvant CIV 5FU.   # proceed with CIV 5FU. Labs today reviewed;  acceptable for treatment today.   #Severe hypokalemia potassium 2.7-sec to diarrhea/chemo improved at 4.3.   # Iron defciency anemia-s/p Venofer; Hb 11.2; STABLE.    # COPD- STABLE; continue inhalers.   # DISPOSITION: # 5FU CIV  pump off d-3; # venofer today # follow up in 2 weeks- MD; labs- cbc/cmp; 5FU CIV only;Venofer- D-3 pump off; Dr.B   All questions were answered. The patient knows to call the clinic with any problems, questions or concerns.    Cammie Sickle, MD 08/20/2020 2:25  PM

## 2020-08-22 ENCOUNTER — Inpatient Hospital Stay: Payer: Medicare HMO

## 2020-08-22 ENCOUNTER — Other Ambulatory Visit: Payer: Self-pay

## 2020-08-22 DIAGNOSIS — Z7189 Other specified counseling: Secondary | ICD-10-CM

## 2020-08-22 DIAGNOSIS — C182 Malignant neoplasm of ascending colon: Secondary | ICD-10-CM

## 2020-08-22 DIAGNOSIS — Z5111 Encounter for antineoplastic chemotherapy: Secondary | ICD-10-CM | POA: Diagnosis not present

## 2020-08-22 MED ORDER — SODIUM CHLORIDE 0.9% FLUSH
10.0000 mL | INTRAVENOUS | Status: DC | PRN
  Administered 2020-08-22: 10 mL
  Filled 2020-08-22: qty 10

## 2020-08-22 MED ORDER — HEPARIN SOD (PORK) LOCK FLUSH 100 UNIT/ML IV SOLN
500.0000 [IU] | Freq: Once | INTRAVENOUS | Status: AC | PRN
  Administered 2020-08-22: 500 [IU]
  Filled 2020-08-22: qty 5

## 2020-08-29 ENCOUNTER — Other Ambulatory Visit: Payer: Self-pay | Admitting: Internal Medicine

## 2020-08-29 NOTE — Telephone Encounter (Signed)
°   Ref Range & Units 9 d ago  (08/20/20) 3 wk ago  (08/06/20) 1 mo ago  (07/23/20) 1 mo ago  (07/17/20)  Potassium 3.5 - 5.1 mmol/L 4.2  4.8  3.5  3.7

## 2020-09-03 ENCOUNTER — Inpatient Hospital Stay: Payer: Medicare HMO | Attending: Internal Medicine

## 2020-09-03 ENCOUNTER — Encounter: Payer: Self-pay | Admitting: Internal Medicine

## 2020-09-03 ENCOUNTER — Inpatient Hospital Stay (HOSPITAL_BASED_OUTPATIENT_CLINIC_OR_DEPARTMENT_OTHER): Payer: Medicare HMO | Admitting: Internal Medicine

## 2020-09-03 ENCOUNTER — Inpatient Hospital Stay: Payer: Medicare HMO

## 2020-09-03 ENCOUNTER — Other Ambulatory Visit: Payer: Self-pay

## 2020-09-03 DIAGNOSIS — Z79899 Other long term (current) drug therapy: Secondary | ICD-10-CM | POA: Insufficient documentation

## 2020-09-03 DIAGNOSIS — R197 Diarrhea, unspecified: Secondary | ICD-10-CM | POA: Insufficient documentation

## 2020-09-03 DIAGNOSIS — Z87891 Personal history of nicotine dependence: Secondary | ICD-10-CM | POA: Diagnosis not present

## 2020-09-03 DIAGNOSIS — C182 Malignant neoplasm of ascending colon: Secondary | ICD-10-CM | POA: Diagnosis present

## 2020-09-03 DIAGNOSIS — I10 Essential (primary) hypertension: Secondary | ICD-10-CM | POA: Insufficient documentation

## 2020-09-03 DIAGNOSIS — E876 Hypokalemia: Secondary | ICD-10-CM

## 2020-09-03 DIAGNOSIS — Z5111 Encounter for antineoplastic chemotherapy: Secondary | ICD-10-CM | POA: Insufficient documentation

## 2020-09-03 DIAGNOSIS — D508 Other iron deficiency anemias: Secondary | ICD-10-CM | POA: Insufficient documentation

## 2020-09-03 DIAGNOSIS — Z7189 Other specified counseling: Secondary | ICD-10-CM

## 2020-09-03 DIAGNOSIS — E611 Iron deficiency: Secondary | ICD-10-CM

## 2020-09-03 LAB — COMPREHENSIVE METABOLIC PANEL
ALT: 20 U/L (ref 0–44)
AST: 25 U/L (ref 15–41)
Albumin: 3.6 g/dL (ref 3.5–5.0)
Alkaline Phosphatase: 56 U/L (ref 38–126)
Anion gap: 5 (ref 5–15)
BUN: 9 mg/dL (ref 8–23)
CO2: 27 mmol/L (ref 22–32)
Calcium: 9 mg/dL (ref 8.9–10.3)
Chloride: 109 mmol/L (ref 98–111)
Creatinine, Ser: 0.81 mg/dL (ref 0.61–1.24)
GFR, Estimated: >60 mL/min (ref >60)
Glucose, Bld: 92 mg/dL (ref 70–99)
Potassium: 4.2 mmol/L (ref 3.5–5.1)
Sodium: 141 mmol/L (ref 135–145)
Total Bilirubin: 1.3 mg/dL — ABNORMAL HIGH (ref 0.3–1.2)
Total Protein: 7.1 g/dL (ref 6.5–8.1)

## 2020-09-03 LAB — CBC WITH DIFFERENTIAL/PLATELET
Abs Immature Granulocytes: 0.05 10*3/uL (ref 0.00–0.07)
Basophils Absolute: 0 10*3/uL (ref 0.0–0.1)
Basophils Relative: 0 %
Eosinophils Absolute: 0.2 10*3/uL (ref 0.0–0.5)
Eosinophils Relative: 2 %
HCT: 37.7 % — ABNORMAL LOW (ref 39.0–52.0)
Hemoglobin: 12.1 g/dL — ABNORMAL LOW (ref 13.0–17.0)
Immature Granulocytes: 1 %
Lymphocytes Relative: 22 %
Lymphs Abs: 2.4 10*3/uL (ref 0.7–4.0)
MCH: 28.1 pg (ref 26.0–34.0)
MCHC: 32.1 g/dL (ref 30.0–36.0)
MCV: 87.7 fL (ref 80.0–100.0)
Monocytes Absolute: 1.1 10*3/uL — ABNORMAL HIGH (ref 0.1–1.0)
Monocytes Relative: 10 %
Neutro Abs: 7 10*3/uL (ref 1.7–7.7)
Neutrophils Relative %: 65 %
Platelets: 235 10*3/uL (ref 150–400)
RBC: 4.3 MIL/uL (ref 4.22–5.81)
RDW: 25.1 % — ABNORMAL HIGH (ref 11.5–15.5)
WBC: 10.7 10*3/uL — ABNORMAL HIGH (ref 4.0–10.5)
nRBC: 0 % (ref 0.0–0.2)

## 2020-09-03 MED ORDER — IRON SUCROSE 20 MG/ML IV SOLN
200.0000 mg | Freq: Once | INTRAVENOUS | Status: AC
  Administered 2020-09-03: 200 mg via INTRAVENOUS
  Filled 2020-09-03: qty 10

## 2020-09-03 MED ORDER — SODIUM CHLORIDE 0.9 % IV SOLN
2400.0000 mg/m2 | INTRAVENOUS | Status: DC
  Administered 2020-09-03: 5400 mg via INTRAVENOUS
  Filled 2020-09-03: qty 108

## 2020-09-03 MED ORDER — SODIUM CHLORIDE 0.9 % IV SOLN
10.0000 mg | Freq: Once | INTRAVENOUS | Status: AC
  Administered 2020-09-03: 10 mg via INTRAVENOUS
  Filled 2020-09-03: qty 1

## 2020-09-03 MED ORDER — SODIUM CHLORIDE 0.9 % IV SOLN: 200.0000 mg | Freq: Once | INTRAVENOUS | Status: DC

## 2020-09-03 MED ORDER — SODIUM CHLORIDE 0.9 % IV SOLN
INTRAVENOUS | Status: DC
  Filled 2020-09-03: qty 250

## 2020-09-03 NOTE — Assessment & Plan Note (Signed)
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  PET scan-no evidence of distant site disease. On adjuvant CIV 5FU.   # proceed with CIV 5FU. Labs today reviewed;  acceptable for treatment today.   # Diarrhea- G-1; sec to 5FU; monitor for now.  # Iron defciency anemia-s/p Venofer; Hb 12.2; STABLE    # COPD- STABLE' continue inhalers.   # DISPOSITION: # 5FU CIV  pump off d-3;Venofer today # follow up in 2 weeks- MD; labs- cbc/cmp; 5FU CIV only; D-3 pump off; Dr.B

## 2020-09-03 NOTE — Progress Notes (Signed)
[facility] °CONSULT NOTE ° °Patient Care Team: °Tate, Denny C, MD as PCP - General (Internal Medicine) °Stanton, Kristi D, RN as Oncology Nurse Navigator °Brahmanday, Govinda R, MD as Consulting Physician (Hematology and Oncology) ° °CHIEF COMPLAINTS/PURPOSE OF CONSULTATION: Colon cancer ° °#  °Oncology History Overview Note  °# July 2021- RIGHT COLON ADENO CARCINOMA- Dr.Byrnett s/p right hemicolectomy- pT4b N2b- STAGE III [high risk]; MSI-STABLE.  ° °# AUG 10th, 2021- FOLFOX [adjuvant] ° °-------------------------------------------------------------------------------------------   °- INVASIVE MODERATELY DIFFERENTIATED ADENOCARCINOMA.  °- TEN OF FIFTY-TWO LYMPH NODES POSITIVE FOR METASTATIC ADENOCARCINOMA  °(10/52).  °- SINGLE INCIDENTAL TUBULOVILLOUS ADENOMA.  °- THREE INCIDENTAL TUBULAR ADENOMAS.  °- SEE CANCER SUMMARY.  ° °CANCER CASE SUMMARY: COLON AND RECTUM  °Procedure: Right hemicolectomy  °Tumor Site: Cecum  °Tumor Size: Greatest dimension: 12.2 cm  °Macroscopic Tumor Perforation: Present  °Histologic Type: Adenocarcinoma  °Histologic Grade: G2: Moderately differentiated  °Tumor Extension: Tumor directly invades adjacent structures (terminal  °ileum)  °Margins: All margins are uninvolved by invasive carcinoma, high-grade  °dysplasia/intramucosal adenocarcinoma, and low-grade dysplasia  °Treatment Effect: No known presurgical therapy  °Lymphovascular Invasion: Present  °Perineural Invasion: Present  °Tumor Deposits: Present: Multiple  °Regional Lymph Nodes:  °     Number of Lymph Nodes Involved: 10  °     Number of Lymph Nodes Examined: 52  °- ° °# COPD- [Dr.Fleming];  ° °# SURVIVORSHIP:  ° °# GENETICS:  ° °DIAGNOSIS: colon cancer ° °STAGE:   III      ;  GOALS:cure ° °CURRENT/MOST RECENT THERAPY : FOLFOX  ° °  °Cancer of right colon ([facility])  °05/20/2020 Initial Diagnosis  ° Cancer of right colon ([facility]) °  °07/02/2020 -  Chemotherapy  ° The patient had dexamethasone (DECADRON) 4 MG tablet, 8 mg,  Oral, Daily, 1 of 1 cycle, Start date: --, End date: -- °palonosetron (ALOXI) injection 0.25 mg, 0.25 mg, Intravenous,  Once, 1 of 1 cycle °Administration: 0.25 mg (07/02/2020) °leucovorin 904 mg in dextrose 5 % 250 mL infusion, 400 mg/m2 = 904 mg, Intravenous,  Once, 1 of 1 cycle °oxaliplatin (ELOXATIN) 190 mg in dextrose 5 % 500 mL chemo infusion, 85 mg/m2 = 190 mg, Intravenous,  Once, 1 of 1 cycle °Administration: 190 mg (07/02/2020) °fluorouracil (ADRUCIL) 5,400 mg in sodium chloride 0.9 % 142 mL chemo infusion, 2,400 mg/m2 = 5,400 mg, Intravenous, 1 Day/Dose, 5 of 12 cycles °Administration: 5,400 mg (07/02/2020), 5,400 mg (07/23/2020), 5,400 mg (08/06/2020), 5,400 mg (08/20/2020) ° for chemotherapy treatment.  °  ° ° ° °HISTORY OF PRESENTING ILLNESS:  °James Hodge 74 y.o.  male stage III colon cancer-high risk currently on adjuvant continuous 5-FU infusion is here for follow-up. ° °Patient complains of mild diarrhea 1-2 loose stools every other day or so.  He does not have to take any Imodium.  No dizzy spells.  No worsening shortness of breath.  Mild cough. ° °Review of Systems  °Constitutional: Positive for malaise/fatigue. Negative for chills, diaphoresis and fever.  °HENT: Negative for nosebleeds and sore throat.   °Eyes: Negative for double vision.  °Respiratory: Positive for cough and shortness of breath. Negative for hemoptysis, sputum production and wheezing.   °Cardiovascular: Negative for chest pain, palpitations, orthopnea and leg swelling.  °Gastrointestinal: Negative for abdominal pain, blood in stool, constipation, diarrhea, heartburn, melena, nausea and vomiting.  °Genitourinary: Negative for dysuria, frequency and urgency.  °Musculoskeletal: Negative for back pain and joint pain.  °Skin: Negative.  Negative for itching and rash.  °  Neurological: Negative for dizziness, focal weakness, weakness and headaches.  Endo/Heme/Allergies: Does not bruise/bleed easily.  Psychiatric/Behavioral: Negative for  depression. The patient is not nervous/anxious and does not have insomnia.      MEDICAL HISTORY:  Past Medical History:  Diagnosis Date   Cancer [facility])    COPD (chronic obstructive pulmonary disease) ([place])    Hypertension     SURGICAL HISTORY: Past Surgical History:  Procedure Laterality Date   CHOLECYSTECTOMY     Around 2001   COLONOSCOPY WITH PROPOFOL N/A 04/12/2020   Procedure: COLONOSCOPY WITH PROPOFOL;  Surgeon: Robert Bellow, MD;  Location: [facility] END[facility];  Service: Endoscopy;  Laterality: N/A;   COLONOSCOPY WITH PROPOFOL N/A 04/24/2020   Procedure: COLONOSCOPY WITH PROPOFOL;  Surgeon: Robert Bellow, MD;  Location: [facility];  Service: Endoscopy;  Laterality: N/A;   ESOPHAGOGASTRODUODENOSCOPY (EGD) WITH PROPOFOL N/A 04/12/2020   Procedure: ESOPHAGOGASTRODUODENOSCOPY (EGD) WITH PROPOFOL;  Surgeon: Robert Bellow, MD;  Location: [facility];  Service: Endoscopy;  Laterality: N/A;   EYE SURGERY Bilateral    cataract   LAPAROSCOPIC PARTIAL COLECTOMY Right 05/20/2020   Procedure: LAPAROSCOPIC PARTIAL COLECTOMY;  Surgeon: Robert Bellow, MD;  Location: [facility] ORS;  Service: General;  Laterality: Right;  Floyce Stakes, RNFA to assist   PORTACATH PLACEMENT Left 06/19/2020   Procedure: INSERTION PORT-A-CATH;  Surgeon: Robert Bellow, MD;  Location: [facility] ORS;  Service: General;  Laterality: Left;    SOCIAL HISTORY: Social History   Socioeconomic History   Marital status: Married    Spouse name: Not on file   Number of children: Not on file   Years of education: Not on file   Highest education level: Not on file  Occupational History   Not on file  Tobacco Use   Smoking status: Former Smoker    Years: 3.00    Types: Cigarettes    Quit date: 11/23/1968    Years since quitting: 51.8   Smokeless tobacco: Never Used  Vaping Use   Vaping Use: Never used  Substance and Sexual Activity   Alcohol use: Not Currently   Drug use: Never    Sexual activity: Not on file  Other Topics Concern   Not on file  Social History Narrative   Lives in [place]; smoked- quit in 70s; beer social; 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; grandson    Social Determinants of Health   Financial Resource Strain:    Difficulty of Paying Living Expenses: Not on file  Food Insecurity:    Worried About Charity fundraiser in the Last Year: Not on file   [employer] of Food in the Last Year: Not on file  Transportation Needs:    Lack of Transportation (Medical): Not on file   Lack of Transportation (Non-Medical): Not on file  Physical Activity:    Days of Exercise per Week: Not on file   Minutes of Exercise per Session: Not on file  Stress:    Feeling of Stress : Not on file  Social Connections:    Frequency of Communication with Friends and Family: Not on file   Frequency of Social Gatherings with Friends and Family: Not on file   Attends Religious Services: Not on file   Active Member of Clubs or Organizations: Not on file   Attends Archivist Meetings: Not on file   Marital Status: Not on file  Intimate Partner Violence:    Fear of Current or Ex-Partner: Not on file   Emotionally Abused: Not on  file  °• Physically Abused: Not on file  °• Sexually Abused: Not on file  ° ° °FAMILY HISTORY: °Family History  °Problem Relation Age of Onset  °• Multiple sclerosis Brother   ° ° °ALLERGIES:  has No Known Allergies. ° °MEDICATIONS:  °Current Outpatient Medications  °Medication Sig Dispense Refill  °• BREO ELLIPTA 100-25 MCG/INH AEPB Inhale 1 puff into the lungs at bedtime.    °• chlorpheniramine-HYDROcodone (TUSSIONEX) 10-8 MG/5ML SUER Take 5 mLs by mouth at bedtime as needed for cough. 140 mL 0  °• lidocaine-prilocaine (EMLA) cream Apply 1 application topically as needed. 30 g 3  °• ondansetron (ZOFRAN) 8 MG tablet One pill every 8 hours as needed for nausea/vomitting. 40 tablet 1  °• potassium chloride SA (KLOR-CON M20) 20 MEQ tablet TAKE  1 TABLET BY MOUTH TWICE A DAY 30 tablet 3  °• predniSONE (DELTASONE) 20 MG tablet Take 3 pills once a day x 3 days; 2 pills/day for 3 days; and the once a day. Take with Breakfast. 30 tablet 0  °• prochlorperazine (COMPAZINE) 10 MG tablet Take 1 tablet (10 mg total) by mouth every 6 (six) hours as needed for nausea or vomiting. 40 tablet 1  °• tiotropium (SPIRIVA) 18 MCG inhalation capsule Place 18 mcg into inhaler and inhale at bedtime.     ° °No current facility-administered medications for this visit.  ° °Facility-Administered Medications Ordered in Other Visits  °Medication Dose Route Frequency Provider Last Rate Last Admin  °• 0.9 %  sodium chloride infusion   Intravenous Continuous Brahmanday, Govinda R, MD 20 mL/hr at 09/03/20 1138 New Bag at 09/03/20 1138  °• dexamethasone (DECADRON) 10 mg in sodium chloride 0.9 % 50 mL IVPB  10 mg Intravenous Once Brahmanday, Govinda R, MD 204 mL/hr at 09/03/20 1217 10 mg at 09/03/20 1217  °• fluorouracil (ADRUCIL) 5,400 mg in sodium chloride 0.9 % 142 mL chemo infusion  2,400 mg/m2 (Treatment Plan Recorded) Intravenous 1 day or 1 dose Brahmanday, Govinda R, MD      °• heparin lock flush 100 unit/mL  500 Units Intravenous Once Brahmanday, Govinda R, MD      ° ° °  °. ° °PHYSICAL EXAMINATION: °ECOG PERFORMANCE STATUS: 1 - Symptomatic but completely ambulatory ° °Vitals:  ° 09/03/20 1109  °BP: 106/74  °Pulse: 71  °Resp: 16  °Temp: 97.8 °F (36.6 °C)  °SpO2: 98%  ° °Filed Weights  ° 09/03/20 1109  °Weight: 215 lb (97.5 kg)  ° ° °Physical Exam °Constitutional:   °   Comments: Elderly male patient.  Patient is in a wheelchair.  Accompanied by his son.  °HENT:  °   Head: Normocephalic and atraumatic.  °   Mouth/Throat:  °   Pharynx: No oropharyngeal exudate.  °Eyes:  °   Pupils: Pupils are equal, round, and reactive to light.  °Cardiovascular:  °   Rate and Rhythm: Normal rate and regular rhythm.  °Pulmonary:  °   Effort: No respiratory distress.  °   Breath sounds: No wheezing.   °   Comments: Decreased air entry bilaterally.  No wheeze or crackles °Abdominal:  °   General: Bowel sounds are normal. There is no distension.  °   Palpations: Abdomen is soft. There is no mass.  °   Tenderness: There is no abdominal tenderness. There is no guarding or rebound.  °Musculoskeletal:     °   General: No tenderness. Normal range of motion.  °   Cervical back: Normal   range of motion and neck supple.  Skin:    General: Skin is warm.  Neurological:     Mental Status: He is alert and oriented to person, place, and time.  Psychiatric:        Mood and Affect: Affect normal.      LABORATORY DATA:  I have reviewed the data as listed Lab Results  Component Value Date   WBC 10.7 (H) 09/03/2020   HGB 12.1 (L) 09/03/2020   HCT 37.7 (L) 09/03/2020   MCV 87.7 09/03/2020   PLT 235 09/03/2020   Recent Labs    07/23/20 0829 07/23/20 0829 08/06/20 0852 08/20/20 0821 09/03/20 1033  NA 141   < > 139 138 141  K 3.5   < > 4.8 4.2 4.2  CL 109   < > 104 107 109  CO2 23   < > _0 GLUCOSE 137*   < > 103* 101* 92  BUN 12   < > _1 CREATININE 0.81   < > 0.84 0.92 0.81  CALCIUM 8.2*   < > 8.8* 8.8* 9.0  GFRNONAA >60   < > >60 >60 >60  GFRAA >60  --  >60 >60  --   PROT 6.4*   < > 7.2 6.7 7.1  ALBUMIN 2.9*   < > 3.3* 3.4* 3.6  AST 19   < > 39 26 25  ALT 22   < > 35 22 20  ALKPHOS 64   < > 95 59 56  BILITOT 1.0   < > 1.0 0.9 1.3*   < > = values in this interval not displayed.    RADIOGRAPHIC STUDIES: I have personally reviewed the radiological images as listed and agreed with the findings in the report. No results found.  ASSESSMENT & PLAN:   Cancer of right colon ([place]) #Colon cancer stage III-PT4BN2B- high risk.  PET scan-no evidence of distant site disease. On adjuvant CIV 5FU.   # proceed with CIV 5FU. Labs today reviewed;  acceptable for treatment today.   # Diarrhea- G-1; sec to 5FU; monitor for now.  # Iron defciency anemia-s/p Venofer; Hb 12.2; STABLE     # COPD- STABLE' continue inhalers.   # DISPOSITION: # 5FU CIV  pump off d-3;Venofer today # follow up in 2 weeks- MD; labs- cbc/cmp; 5FU CIV only; D-3 pump off; Dr.B   All questions were answered. The patient knows to call the clinic with any problems, questions or concerns.    Cammie Sickle, MD 09/03/2020 12:21 PM

## 2020-09-05 ENCOUNTER — Inpatient Hospital Stay: Payer: Medicare HMO

## 2020-09-05 ENCOUNTER — Other Ambulatory Visit: Payer: Self-pay

## 2020-09-05 VITALS — BP 121/75 | HR 84 | Temp 98.0°F

## 2020-09-05 DIAGNOSIS — Z5111 Encounter for antineoplastic chemotherapy: Secondary | ICD-10-CM | POA: Diagnosis not present

## 2020-09-05 DIAGNOSIS — C182 Malignant neoplasm of ascending colon: Secondary | ICD-10-CM

## 2020-09-05 DIAGNOSIS — Z7189 Other specified counseling: Secondary | ICD-10-CM

## 2020-09-05 MED ORDER — SODIUM CHLORIDE 0.9% FLUSH
10.0000 mL | INTRAVENOUS | Status: DC | PRN
  Administered 2020-09-05: 10 mL
  Filled 2020-09-05: qty 10

## 2020-09-05 MED ORDER — HEPARIN SOD (PORK) LOCK FLUSH 100 UNIT/ML IV SOLN
INTRAVENOUS | Status: AC
  Filled 2020-09-05: qty 5

## 2020-09-05 MED ORDER — HEPARIN SOD (PORK) LOCK FLUSH 100 UNIT/ML IV SOLN
500.0000 [IU] | Freq: Once | INTRAVENOUS | Status: AC | PRN
  Administered 2020-09-05: 500 [IU]
  Filled 2020-09-05: qty 5

## 2020-09-11 ENCOUNTER — Other Ambulatory Visit: Payer: Self-pay | Admitting: Internal Medicine

## 2020-09-11 NOTE — Telephone Encounter (Signed)
   Ref Range & Units 8 d ago 3 wk ago  Potassium 3.5 - 5.1 mmol/L 4.2  4.2

## 2020-09-17 ENCOUNTER — Inpatient Hospital Stay: Payer: Medicare HMO

## 2020-09-17 ENCOUNTER — Other Ambulatory Visit: Payer: Self-pay

## 2020-09-17 ENCOUNTER — Inpatient Hospital Stay (HOSPITAL_BASED_OUTPATIENT_CLINIC_OR_DEPARTMENT_OTHER): Payer: Medicare HMO | Admitting: Internal Medicine

## 2020-09-17 DIAGNOSIS — C182 Malignant neoplasm of ascending colon: Secondary | ICD-10-CM | POA: Diagnosis not present

## 2020-09-17 DIAGNOSIS — Z5111 Encounter for antineoplastic chemotherapy: Secondary | ICD-10-CM | POA: Diagnosis not present

## 2020-09-17 DIAGNOSIS — Z7189 Other specified counseling: Secondary | ICD-10-CM

## 2020-09-17 LAB — COMPREHENSIVE METABOLIC PANEL
ALT: 18 U/L (ref 0–44)
AST: 22 U/L (ref 15–41)
Albumin: 3.4 g/dL — ABNORMAL LOW (ref 3.5–5.0)
Alkaline Phosphatase: 52 U/L (ref 38–126)
Anion gap: 5 (ref 5–15)
BUN: 15 mg/dL (ref 8–23)
CO2: 30 mmol/L (ref 22–32)
Calcium: 8.7 mg/dL — ABNORMAL LOW (ref 8.9–10.3)
Chloride: 106 mmol/L (ref 98–111)
Creatinine, Ser: 0.97 mg/dL (ref 0.61–1.24)
GFR, Estimated: >60 mL/min (ref >60)
Glucose, Bld: 104 mg/dL — ABNORMAL HIGH (ref 70–99)
Potassium: 3.9 mmol/L (ref 3.5–5.1)
Sodium: 141 mmol/L (ref 135–145)
Total Bilirubin: 1.2 mg/dL (ref 0.3–1.2)
Total Protein: 6.3 g/dL — ABNORMAL LOW (ref 6.5–8.1)

## 2020-09-17 LAB — CBC WITH DIFFERENTIAL/PLATELET
Abs Immature Granulocytes: 0.03 10*3/uL (ref 0.00–0.07)
Basophils Absolute: 0 10*3/uL (ref 0.0–0.1)
Basophils Relative: 0 %
Eosinophils Absolute: 0.3 10*3/uL (ref 0.0–0.5)
Eosinophils Relative: 3 %
HCT: 37 % — ABNORMAL LOW (ref 39.0–52.0)
Hemoglobin: 12 g/dL — ABNORMAL LOW (ref 13.0–17.0)
Immature Granulocytes: 0 %
Lymphocytes Relative: 24 %
Lymphs Abs: 2.4 10*3/uL (ref 0.7–4.0)
MCH: 29.3 pg (ref 26.0–34.0)
MCHC: 32.4 g/dL (ref 30.0–36.0)
MCV: 90.5 fL (ref 80.0–100.0)
Monocytes Absolute: 0.9 10*3/uL (ref 0.1–1.0)
Monocytes Relative: 9 %
Neutro Abs: 6.5 10*3/uL (ref 1.7–7.7)
Neutrophils Relative %: 64 %
Platelets: 209 10*3/uL (ref 150–400)
RBC: 4.09 MIL/uL — ABNORMAL LOW (ref 4.22–5.81)
RDW: 22.9 % — ABNORMAL HIGH (ref 11.5–15.5)
WBC: 10.2 10*3/uL (ref 4.0–10.5)
nRBC: 0 % (ref 0.0–0.2)

## 2020-09-17 MED ORDER — SODIUM CHLORIDE 0.9 % IV SOLN
2400.0000 mg/m2 | INTRAVENOUS | Status: DC
  Administered 2020-09-17: 5400 mg via INTRAVENOUS
  Filled 2020-09-17: qty 108

## 2020-09-17 MED ORDER — SODIUM CHLORIDE 0.9% FLUSH
10.0000 mL | INTRAVENOUS | Status: DC | PRN
  Administered 2020-09-17: 10 mL
  Filled 2020-09-17: qty 10

## 2020-09-17 MED ORDER — SODIUM CHLORIDE 0.9 % IV SOLN
10.0000 mg | Freq: Once | INTRAVENOUS | Status: AC
  Administered 2020-09-17: 10 mg via INTRAVENOUS
  Filled 2020-09-17: qty 10

## 2020-09-17 NOTE — Addendum Note (Signed)
Addended by: Delice Bison E on: 09/17/2020 03:30 PM   Modules accepted: Orders

## 2020-09-17 NOTE — Progress Notes (Signed)
[facility] NOTE  Patient Care Team: Albina Billet, MD as PCP - General (Internal Medicine) Clent Jacks, RN as Oncology Nurse Navigator Cammie Sickle, MD as Consulting Physician (Hematology and Oncology)  CHIEF COMPLAINTS/PURPOSE OF CONSULTATION: Colon cancer  #  Oncology History Overview Note  # July 2021- RIGHT COLON ADENO CARCINOMA- Dr.Byrnett s/p right hemicolectomy- pT4b N2b- STAGE III [high risk]; MSI-STABLE.   # AUG 10th, 2021- FOLFOX [adjuvant]  -------------------------------------------------------------------------------------------   - INVASIVE MODERATELY DIFFERENTIATED ADENOCARCINOMA.  - TEN OF FIFTY-TWO LYMPH NODES POSITIVE FOR METASTATIC ADENOCARCINOMA  (10/52).  - SINGLE INCIDENTAL TUBULOVILLOUS ADENOMA.  - THREE INCIDENTAL TUBULAR ADENOMAS.  - SEE CANCER SUMMARY.   CANCER CASE SUMMARY: COLON AND RECTUM  Procedure: Right hemicolectomy  Tumor Site: Cecum  Tumor Size: Greatest dimension: 12.2 cm  Macroscopic Tumor Perforation: Present  Histologic Type: Adenocarcinoma  Histologic Grade: G2: Moderately differentiated  Tumor Extension: Tumor directly invades adjacent structures (terminal  ileum)  Margins: All margins are uninvolved by invasive carcinoma, high-grade  dysplasia/intramucosal adenocarcinoma, and low-grade dysplasia  Treatment Effect: No known presurgical therapy  Lymphovascular Invasion: Present  Perineural Invasion: Present  Tumor Deposits: Present: Multiple  Regional Lymph Nodes:       Number of Lymph Nodes Involved: 10       Number of Lymph Nodes Examined: 30  -  # COPD- [Dr.Fleming];   # SURVIVORSHIP:   # GENETICS:   DIAGNOSIS: colon cancer  STAGE:   III      ;  GOALS:cure  CURRENT/MOST RECENT THERAPY : FOLFOX     Cancer of right colon ([place])  05/20/2020 Initial Diagnosis   Cancer of right colon ([place])   07/02/2020 -  Chemotherapy   The patient had dexamethasone (DECADRON) 4 MG tablet, 8 mg,  Oral, Daily, 1 of 1 cycle, Start date: --, End date: -- palonosetron (ALOXI) injection 0.25 mg, 0.25 mg, Intravenous,  Once, 1 of 1 cycle Administration: 0.25 mg (07/02/2020) leucovorin 904 mg in dextrose 5 % 250 mL infusion, 400 mg/m2 = 904 mg, Intravenous,  Once, 1 of 1 cycle oxaliplatin (ELOXATIN) 190 mg in dextrose 5 % 500 mL chemo infusion, 85 mg/m2 = 190 mg, Intravenous,  Once, 1 of 1 cycle Administration: 190 mg (07/02/2020) fluorouracil (ADRUCIL) 5,400 mg in sodium chloride 0.9 % 142 mL chemo infusion, 2,400 mg/m2 = 5,400 mg, Intravenous, 1 Day/Dose, 5 of 12 cycles Administration: 5,400 mg (07/02/2020), 5,400 mg (07/23/2020), 5,400 mg (08/06/2020), 5,400 mg (08/20/2020), 5,400 mg (09/03/2020)  for chemotherapy treatment.       HISTORY OF PRESENTING ILLNESS:  James Hodge 74 y.o.  male stage III colon cancer-high risk currently on adjuvant continuous 5-FU infusion is here for follow-up.  Continues to have mild diarrhea 1-2 loose stools every other day.  He does not need to take Imodium.  No worsening shortness of breath or cough.  No dizzy spells no falls.  No tingling or numbness.  Review of Systems  Constitutional: Positive for malaise/fatigue. Negative for chills, diaphoresis and fever.  HENT: Negative for nosebleeds and sore throat.   Eyes: Negative for double vision.  Respiratory: Positive for cough and shortness of breath. Negative for hemoptysis, sputum production and wheezing.   Cardiovascular: Negative for chest pain, palpitations, orthopnea and leg swelling.  Gastrointestinal: Negative for abdominal pain, blood in stool, constipation, diarrhea, heartburn, melena, nausea and vomiting.  Genitourinary: Negative for dysuria, frequency and urgency.  Musculoskeletal: Negative for back pain and joint pain.  Skin: Negative.  Negative for itching and rash.  Neurological: Negative for dizziness, focal weakness, weakness and headaches.  Endo/Heme/Allergies: Does not bruise/bleed easily.   Psychiatric/Behavioral: Negative for depression. The patient is not nervous/anxious and does not have insomnia.      MEDICAL HISTORY:  Past Medical History:  Diagnosis Date  . Cancer ([place])   . COPD (chronic obstructive pulmonary disease) ([place])   . Hypertension     SURGICAL HISTORY: Past Surgical History:  Procedure Laterality Date  . CHOLECYSTECTOMY     Around 2001  . COLONOSCOPY WITH PROPOFOL N/A 04/12/2020   Procedure: COLONOSCOPY WITH PROPOFOL;  Surgeon: Robert Bellow, MD;  Location: [facility] ENDOSCOPY;  Service: Endoscopy;  Laterality: N/A;  . COLONOSCOPY WITH PROPOFOL N/A 04/24/2020   Procedure: COLONOSCOPY WITH PROPOFOL;  Surgeon: Robert Bellow, MD;  Location: [facility] ENDOSCOPY;  Service: Endoscopy;  Laterality: N/A;  . ESOPHAGOGASTRODUODENOSCOPY (EGD) WITH PROPOFOL N/A 04/12/2020   Procedure: ESOPHAGOGASTRODUODENOSCOPY (EGD) WITH PROPOFOL;  Surgeon: Robert Bellow, MD;  Location: [facility] ENDOSCOPY;  Service: Endoscopy;  Laterality: N/A;  . EYE SURGERY Bilateral    cataract  . LAPAROSCOPIC PARTIAL COLECTOMY Right 05/20/2020   Procedure: LAPAROSCOPIC PARTIAL COLECTOMY;  Surgeon: Robert Bellow, MD;  Location: [facility] ORS;  Service: General;  Laterality: Right;  Floyce Stakes, RNFA to assist  . PORTACATH PLACEMENT Left 06/19/2020   Procedure: INSERTION PORT-A-CATH;  Surgeon: Robert Bellow, MD;  Location: [facility] ORS;  Service: General;  Laterality: Left;    SOCIAL HISTORY: Social History   Socioeconomic History  . Marital status: Married    Spouse name: Not on file  . Number of children: Not on file  . Years of education: Not on file  . Highest education level: Not on file  Occupational History  . Not on file  Tobacco Use  . Smoking status: Former Smoker    Years: 3.00    Types: Cigarettes    Quit date: 11/23/1968    Years since quitting: 51.8  . Smokeless tobacco: Never Used  Vaping Use  . Vaping Use: Never used  Substance and Sexual Activity  . Alcohol use:  Not Currently  . Drug use: Never  . Sexual activity: Not on file  Other Topics Concern  . 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 Difficulty of Paying Living Expenses: Not on file  Food Insecurity:   . Worried About Charity fundraiser in the Last Year: Not on file  . Ran Out of Food in the Last Year: Not on file  Transportation Needs:   . Lack of Transportation (Medical): Not on file  . Lack of Transportation (Non-Medical): Not on file  Physical Activity:   . Days of Exercise per Week: Not on file  . Minutes of Exercise per Session: Not on file  Stress:   . Feeling of Stress : Not on file  Social Connections:   . Frequency of Communication with Friends and Family: Not on file  . Frequency of Social Gatherings with Friends and Family: Not on file  . Attends Religious Services: Not on file  . Active Member of Clubs or Organizations: Not on file  . Attends Archivist Meetings: Not on file  . Marital Status: Not on file  Intimate Partner Violence:   . Fear of Current or Ex-Partner: Not on file  .  Emotionally Abused: Not on file  . Physically Abused: Not on file  . Sexually Abused: Not on file    FAMILY HISTORY: Family History  Problem Relation Age of Onset  . Multiple sclerosis Brother     ALLERGIES:  has No Known Allergies.  MEDICATIONS:  Current Outpatient Medications  Medication Sig Dispense Refill  . BREO ELLIPTA 100-25 MCG/INH AEPB Inhale 1 puff into the lungs at bedtime.    . chlorpheniramine-HYDROcodone (TUSSIONEX) 10-8 MG/5ML SUER Take 5 mLs by mouth at bedtime as needed for cough. 140 mL 0  . lidocaine-prilocaine (EMLA) cream Apply 1 application topically as needed. 30 g 3  . ondansetron (ZOFRAN) 8 MG tablet One pill every 8 hours as needed for nausea/vomitting. 40 tablet 1  . potassium  chloride SA (KLOR-CON M20) 20 MEQ tablet TAKE 1 TABLET BY MOUTH TWICE A DAY 60 tablet 3  . predniSONE (DELTASONE) 20 MG tablet Take 3 pills once a day x 3 days; 2 pills/day for 3 days; and the once a day. Take with Breakfast. 30 tablet 0  . prochlorperazine (COMPAZINE) 10 MG tablet Take 1 tablet (10 mg total) by mouth every 6 (six) hours as needed for nausea or vomiting. 40 tablet 1  . tiotropium (SPIRIVA) 18 MCG inhalation capsule Place 18 mcg into inhaler and inhale at bedtime.      No current facility-administered medications for this visit.   Facility-Administered Medications Ordered in Other Visits  Medication Dose Route Frequency Provider Last Rate Last Admin  . heparin lock flush 100 unit/mL  500 Units Intravenous Once Charlaine Dalton R, MD          .  PHYSICAL EXAMINATION: ECOG PERFORMANCE STATUS: 1 - Symptomatic but completely ambulatory  Vitals:   09/17/20 1126  BP: 120/69  Pulse: 63  Resp: 16  Temp: 98.2 F (36.8 C)  SpO2: 98%   Filed Weights   09/17/20 1126  Weight: 221 lb (100.2 kg)    Physical Exam Constitutional:      Comments: Elderly male patient.  Patient is in a wheelchair.  Accompanied by his son.  HENT:     Head: Normocephalic and atraumatic.     Mouth/Throat:     Pharynx: No oropharyngeal exudate.  Eyes:     Pupils: Pupils are equal, round, and reactive to light.  Cardiovascular:     Rate and Rhythm: Normal rate and regular rhythm.  Pulmonary:     Effort: No respiratory distress.     Breath sounds: No wheezing.     Comments: Decreased air entry bilaterally.  No wheeze or crackles Abdominal:     General: Bowel sounds are normal. There is no distension.     Palpations: Abdomen is soft. There is no mass.     Tenderness: There is no abdominal tenderness. There is no guarding or rebound.  Musculoskeletal:        General: No tenderness. Normal range of motion.     Cervical back: Normal range of motion and neck supple.  Skin:    General: Skin  is warm.  Neurological:     Mental Status: He is alert and oriented to person, place, and time.  Psychiatric:        Mood and Affect: Affect normal.      LABORATORY DATA:  I have reviewed the data as listed Lab Results  Component Value Date   WBC 10.2 09/17/2020   HGB 12.0 (L) 09/17/2020   HCT 37.0 (L) 09/17/2020   MCV 90.5 09/17/2020  PLT 209 09/17/2020   Recent Labs    07/23/20 0829 07/23/20 0829 08/06/20 0852 08/06/20 0852 08/20/20 0821 09/03/20 1033 09/17/20 1039  NA 141   < > 139   < > 138 141 141  K 3.5   < > 4.8   < > 4.2 4.2 3.9  CL 109   < > 104   < > 107 109 106  CO2 23   < > 28   < > _0 GLUCOSE 137*   < > 103*   < > 101* 92 104*  BUN 12   < > 14   < > _1 CREATININE 0.81   < > 0.84   < > 0.92 0.81 0.97  CALCIUM 8.2*   < > 8.8*   < > 8.8* 9.0 8.7*  GFRNONAA >60   < > >60   < > >60 >60 >60  GFRAA >60  --  >60  --  >60  --   --   PROT 6.4*   < > 7.2   < > 6.7 7.1 6.3*  ALBUMIN 2.9*   < > 3.3*   < > 3.4* 3.6 3.4*  AST 19   < > 39   < > _2 ALT 22   < > 35   < > _3 ALKPHOS 64   < > 95   < > 59 56 52  BILITOT 1.0   < > 1.0   < > 0.9 1.3* 1.2   < > = values in this interval not displayed.    RADIOGRAPHIC STUDIES: I have personally reviewed the radiological images as listed and agreed with the findings in the report. No results found.  ASSESSMENT & PLAN:   Cancer of right colon ([place]) #Colon cancer stage III-PT4BN2B- high risk.  PET scan-no evidence of distant site disease. On adjuvant CIV 5FU. STABLE.   # proceed with CIV 5FU. Labs today reviewed;  acceptable for treatment today.   # Diarrhea- G-1; STABLE.  sec to 5FU; monitor for now.  # Iron defciency anemia-s/p Venofer; Hb 12.2;STABLE.   # COPD- STABLE.  continue inhalers.   # Flu shot with PCP/next week  # DISPOSITION: # 5FU CIV  pump off d-3; # follow up in 2 weeks- MD; labs- cbc/cmp;CEA; 5FU CIV only; D-3 pump off; Dr.B   All questions were answered. The patient  knows to call the clinic with any problems, questions or concerns.    Cammie Sickle, MD 09/17/2020 11:51 AM

## 2020-09-17 NOTE — Assessment & Plan Note (Addendum)
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  PET scan-no evidence of distant site disease. On adjuvant CIV 5FU. STABLE.   # proceed with CIV 5FU. Labs today reviewed;  acceptable for treatment today.   # Diarrhea- G-1; STABLE.  sec to 5FU; monitor for now.  # Iron defciency anemia-s/p Venofer; Hb 12.2;STABLE.   # COPD- STABLE.  continue inhalers.   # Flu shot with PCP/next week  # DISPOSITION: # 5FU CIV  pump off d-3; # follow up in 2 weeks- MD; labs- cbc/cmp;CEA; 5FU CIV only; D-3 pump off; Dr.B

## 2020-09-19 ENCOUNTER — Other Ambulatory Visit: Payer: Self-pay

## 2020-09-19 ENCOUNTER — Inpatient Hospital Stay: Payer: Medicare HMO

## 2020-09-19 DIAGNOSIS — C182 Malignant neoplasm of ascending colon: Secondary | ICD-10-CM

## 2020-09-19 DIAGNOSIS — Z7189 Other specified counseling: Secondary | ICD-10-CM

## 2020-09-19 DIAGNOSIS — Z5111 Encounter for antineoplastic chemotherapy: Secondary | ICD-10-CM | POA: Diagnosis not present

## 2020-09-19 MED ORDER — HEPARIN SOD (PORK) LOCK FLUSH 100 UNIT/ML IV SOLN
500.0000 [IU] | Freq: Once | INTRAVENOUS | Status: AC | PRN
  Administered 2020-09-19: 500 [IU]
  Filled 2020-09-19: qty 5

## 2020-09-19 MED ORDER — SODIUM CHLORIDE 0.9% FLUSH
10.0000 mL | INTRAVENOUS | Status: DC | PRN
  Administered 2020-09-19: 10 mL
  Filled 2020-09-19: qty 10

## 2020-09-19 MED ORDER — HEPARIN SOD (PORK) LOCK FLUSH 100 UNIT/ML IV SOLN
INTRAVENOUS | Status: AC
  Filled 2020-09-19: qty 5

## 2020-10-01 ENCOUNTER — Inpatient Hospital Stay: Payer: Medicare HMO | Attending: Internal Medicine

## 2020-10-01 ENCOUNTER — Inpatient Hospital Stay: Payer: Medicare HMO

## 2020-10-01 ENCOUNTER — Other Ambulatory Visit: Payer: Self-pay

## 2020-10-01 ENCOUNTER — Inpatient Hospital Stay (HOSPITAL_BASED_OUTPATIENT_CLINIC_OR_DEPARTMENT_OTHER): Payer: Medicare HMO | Admitting: Internal Medicine

## 2020-10-01 DIAGNOSIS — J449 Chronic obstructive pulmonary disease, unspecified: Secondary | ICD-10-CM | POA: Insufficient documentation

## 2020-10-01 DIAGNOSIS — I10 Essential (primary) hypertension: Secondary | ICD-10-CM | POA: Insufficient documentation

## 2020-10-01 DIAGNOSIS — C182 Malignant neoplasm of ascending colon: Secondary | ICD-10-CM | POA: Diagnosis not present

## 2020-10-01 DIAGNOSIS — R197 Diarrhea, unspecified: Secondary | ICD-10-CM | POA: Insufficient documentation

## 2020-10-01 DIAGNOSIS — Z452 Encounter for adjustment and management of vascular access device: Secondary | ICD-10-CM | POA: Insufficient documentation

## 2020-10-01 DIAGNOSIS — Z5111 Encounter for antineoplastic chemotherapy: Secondary | ICD-10-CM | POA: Diagnosis not present

## 2020-10-01 DIAGNOSIS — D509 Iron deficiency anemia, unspecified: Secondary | ICD-10-CM | POA: Insufficient documentation

## 2020-10-01 DIAGNOSIS — Z7189 Other specified counseling: Secondary | ICD-10-CM

## 2020-10-01 LAB — COMPREHENSIVE METABOLIC PANEL WITH GFR
ALT: 19 U/L (ref 0–44)
AST: 22 U/L (ref 15–41)
Albumin: 3.5 g/dL (ref 3.5–5.0)
Alkaline Phosphatase: 51 U/L (ref 38–126)
Anion gap: 7 (ref 5–15)
BUN: 17 mg/dL (ref 8–23)
CO2: 27 mmol/L (ref 22–32)
Calcium: 8.9 mg/dL (ref 8.9–10.3)
Chloride: 108 mmol/L (ref 98–111)
Creatinine, Ser: 1.02 mg/dL (ref 0.61–1.24)
GFR, Estimated: >60 mL/min (ref >60)
Glucose, Bld: 109 mg/dL — ABNORMAL HIGH (ref 70–99)
Potassium: 3.8 mmol/L (ref 3.5–5.1)
Sodium: 142 mmol/L (ref 135–145)
Total Bilirubin: 1.2 mg/dL (ref 0.3–1.2)
Total Protein: 6.5 g/dL (ref 6.5–8.1)

## 2020-10-01 LAB — CBC WITH DIFFERENTIAL/PLATELET
Abs Immature Granulocytes: 0.02 10*3/uL (ref 0.00–0.07)
Basophils Absolute: 0.1 10*3/uL (ref 0.0–0.1)
Basophils Relative: 1 %
Eosinophils Absolute: 0.3 10*3/uL (ref 0.0–0.5)
Eosinophils Relative: 3 %
HCT: 39.4 % (ref 39.0–52.0)
Hemoglobin: 12.7 g/dL — ABNORMAL LOW (ref 13.0–17.0)
Immature Granulocytes: 0 %
Lymphocytes Relative: 25 %
Lymphs Abs: 2.6 10*3/uL (ref 0.7–4.0)
MCH: 30.2 pg (ref 26.0–34.0)
MCHC: 32.2 g/dL (ref 30.0–36.0)
MCV: 93.8 fL (ref 80.0–100.0)
Monocytes Absolute: 0.8 10*3/uL (ref 0.1–1.0)
Monocytes Relative: 8 %
Neutro Abs: 6.6 10*3/uL (ref 1.7–7.7)
Neutrophils Relative %: 63 %
Platelets: 222 10*3/uL (ref 150–400)
RBC: 4.2 MIL/uL — ABNORMAL LOW (ref 4.22–5.81)
RDW: 20.3 % — ABNORMAL HIGH (ref 11.5–15.5)
WBC: 10.5 10*3/uL (ref 4.0–10.5)
nRBC: 0 % (ref 0.0–0.2)

## 2020-10-01 MED ORDER — SODIUM CHLORIDE 0.9% FLUSH
10.0000 mL | INTRAVENOUS | Status: DC | PRN
  Administered 2020-10-01: 10 mL
  Filled 2020-10-01: qty 10

## 2020-10-01 MED ORDER — SODIUM CHLORIDE 0.9% FLUSH
10.0000 mL | Freq: Once | INTRAVENOUS | Status: AC
  Administered 2020-10-01: 10 mL via INTRAVENOUS
  Filled 2020-10-01: qty 10

## 2020-10-01 MED ORDER — SODIUM CHLORIDE 0.9 % IV SOLN
10.0000 mg | Freq: Once | INTRAVENOUS | Status: AC
  Administered 2020-10-01: 10 mg via INTRAVENOUS
  Filled 2020-10-01: qty 10

## 2020-10-01 MED ORDER — SODIUM CHLORIDE 0.9 % IV SOLN
Freq: Once | INTRAVENOUS | Status: AC
  Filled 2020-10-01: qty 250

## 2020-10-01 MED ORDER — SODIUM CHLORIDE 0.9 % IV SOLN
2400.0000 mg/m2 | INTRAVENOUS | Status: DC
  Administered 2020-10-01: 5400 mg via INTRAVENOUS
  Filled 2020-10-01: qty 108

## 2020-10-01 NOTE — Progress Notes (Signed)
[place] NOTE  Patient Care Team: Albina Billet, MD as PCP - General (Internal Medicine) Clent Jacks, RN as Oncology Nurse Navigator Cammie Sickle, MD as Consulting Physician (Hematology and Oncology)  CHIEF COMPLAINTS/PURPOSE OF CONSULTATION: Colon cancer  #  Oncology History Overview Note  # July 2021- RIGHT COLON ADENO CARCINOMA- Dr.Byrnett s/p right hemicolectomy- pT4b N2b- STAGE III [high risk]; MSI-STABLE.   # AUG 10th, 2021- FOLFOX [adjuvant]  -------------------------------------------------------------------------------------------   - INVASIVE MODERATELY DIFFERENTIATED ADENOCARCINOMA.  - TEN OF FIFTY-TWO LYMPH NODES POSITIVE FOR METASTATIC ADENOCARCINOMA  (10/52).  - SINGLE INCIDENTAL TUBULOVILLOUS ADENOMA.  - THREE INCIDENTAL TUBULAR ADENOMAS.  - SEE CANCER SUMMARY.   CANCER CASE SUMMARY: COLON AND RECTUM  Procedure: Right hemicolectomy  Tumor Site: Cecum  Tumor Size: Greatest dimension: 12.2 cm  Macroscopic Tumor Perforation: Present  Histologic Type: Adenocarcinoma  Histologic Grade: G2: Moderately differentiated  Tumor Extension: Tumor directly invades adjacent structures (terminal  ileum)  Margins: All margins are uninvolved by invasive carcinoma, high-grade  dysplasia/intramucosal adenocarcinoma, and low-grade dysplasia  Treatment Effect: No known presurgical therapy  Lymphovascular Invasion: Present  Perineural Invasion: Present  Tumor Deposits: Present: Multiple  Regional Lymph Nodes:       Number of Lymph Nodes Involved: 10       Number of Lymph Nodes Examined: 9  -  # COPD- [Dr.Fleming];   # SURVIVORSHIP:   # GENETICS:   DIAGNOSIS: colon cancer  STAGE:   III      ;  GOALS:cure  CURRENT/MOST RECENT THERAPY : FOLFOX     Cancer of right colon ([place])  05/20/2020 Initial Diagnosis   Cancer of right colon ([place])   07/02/2020 -  Chemotherapy   The patient had dexamethasone (DECADRON) 4 MG tablet, 8 mg,  Oral, Daily, 1 of 1 cycle, Start date: --, End date: -- palonosetron (ALOXI) injection 0.25 mg, 0.25 mg, Intravenous,  Once, 1 of 1 cycle Administration: 0.25 mg (07/02/2020) leucovorin 904 mg in dextrose 5 % 250 mL infusion, 400 mg/m2 = 904 mg, Intravenous,  Once, 1 of 1 cycle oxaliplatin (ELOXATIN) 190 mg in dextrose 5 % 500 mL chemo infusion, 85 mg/m2 = 190 mg, Intravenous,  Once, 1 of 1 cycle Administration: 190 mg (07/02/2020) fluorouracil (ADRUCIL) 5,400 mg in sodium chloride 0.9 % 142 mL chemo infusion, 2,400 mg/m2 = 5,400 mg, Intravenous, 1 Day/Dose, 6 of 12 cycles Administration: 5,400 mg (07/02/2020), 5,400 mg (07/23/2020), 5,400 mg (08/06/2020), 5,400 mg (08/20/2020), 5,400 mg (09/03/2020), 5,400 mg (09/17/2020)  for chemotherapy treatment.       HISTORY OF PRESENTING ILLNESS:  James Hodge 74 y.o.  male stage III colon cancer-high risk currently on adjuvant continuous 5-FU infusion is here for follow-up.  Patient continues to have mild diarrhea 1-2 loose stools every other day.  He does not help taking medications.  Chronic mild shortness of breath chronic mild cough.  Not any worse.  No dizzy spells no falls.  No tingling or numbness.  Review of Systems  Constitutional: Positive for malaise/fatigue. Negative for chills, diaphoresis and fever.  HENT: Negative for nosebleeds and sore throat.   Eyes: Negative for double vision.  Respiratory: Positive for cough and shortness of breath. Negative for hemoptysis, sputum production and wheezing.   Cardiovascular: Negative for chest pain, palpitations, orthopnea and leg swelling.  Gastrointestinal: Negative for abdominal pain, blood in stool, constipation, diarrhea, heartburn, melena, nausea and vomiting.  Genitourinary: Negative for dysuria, frequency and urgency.  Musculoskeletal: Negative for back  pain and joint pain.  Skin: Negative.  Negative for itching and rash.  Neurological: Negative for dizziness, focal weakness, weakness and  headaches.  Endo/Heme/Allergies: Does not bruise/bleed easily.  Psychiatric/Behavioral: Negative for depression. The patient is not nervous/anxious and does not have insomnia.      MEDICAL HISTORY:  Past Medical History:  Diagnosis Date  . Cancer ([place])   . COPD (chronic obstructive pulmonary disease) ([place])   . Hypertension     SURGICAL HISTORY: Past Surgical History:  Procedure Laterality Date  . CHOLECYSTECTOMY     Around 2001  . COLONOSCOPY WITH PROPOFOL N/A 04/12/2020   Procedure: COLONOSCOPY WITH PROPOFOL;  Surgeon: Robert Bellow, MD;  Location: [facility];  Service: Endoscopy;  Laterality: N/A;  . COLONOSCOPY WITH PROPOFOL N/A 04/24/2020   Procedure: COLONOSCOPY WITH PROPOFOL;  Surgeon: Robert Bellow, MD;  Location: [facility];  Service: Endoscopy;  Laterality: N/A;  . ESOPHAGOGASTRODUODENOSCOPY (EGD) WITH PROPOFOL N/A 04/12/2020   Procedure: ESOPHAGOGASTRODUODENOSCOPY (EGD) WITH PROPOFOL;  Surgeon: Robert Bellow, MD;  Location: [facility];  Service: Endoscopy;  Laterality: N/A;  . EYE SURGERY Bilateral    cataract  . LAPAROSCOPIC PARTIAL COLECTOMY Right 05/20/2020   Procedure: LAPAROSCOPIC PARTIAL COLECTOMY;  Surgeon: Robert Bellow, MD;  Location: [facility];  Service: General;  Laterality: Right;  Floyce Stakes, RNFA to assist  . PORTACATH PLACEMENT Left 06/19/2020   Procedure: INSERTION PORT-A-CATH;  Surgeon: Robert Bellow, MD;  Location: [facility]S;  Service: General;  Laterality: Left;    SOCIAL HISTORY: Social History   Socioeconomic History  . Marital status: Married    Spouse name: Not on file  . Number of children: Not on file  . Years of education: Not on file  . Highest education level: Not on file  Occupational History  . Not on file  Tobacco Use  . Smoking status: Former Smoker    Years: 3.00    Types: Cigarettes    Quit date: 11/23/1968    Years since quitting: 51.8  . Smokeless tobacco: Never Used  Vaping Use  . Vaping  Use: Never used  Substance and Sexual Activity  . Alcohol use: Not Currently  . Drug use: Never  . Sexual activity: Not on file  Other Topics Concern  . 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 Difficulty of Paying Living Expenses: Not on file  Food Insecurity:   . Worried About Charity fundraiser in the Last Year: Not on file  . Ran Out of Food in the Last Year: Not on file  Transportation Needs:   . Lack of Transportation (Medical): Not on file  . Lack of Transportation (Non-Medical): Not on file  Physical Activity:   . Days of Exercise per Week: Not on file  . Minutes of Exercise per Session: Not on file  Stress:   . Feeling of Stress : Not on file  Social Connections:   . Frequency of Communication with Friends and Family: Not on file  . Frequency of Social Gatherings with Friends and Family: Not on file  . Attends Religious Services: Not on file  . Active Member of Clubs or Organizations: Not on file  . Attends Archivist Meetings: Not on file  . Marital Status: Not on file  Intimate Partner Violence:   .  Fear of Current or Ex-Partner: Not on file  . Emotionally Abused: Not on file  . Physically Abused: Not on file  . Sexually Abused: Not on file    FAMILY HISTORY: Family History  Problem Relation Age of Onset  . Multiple sclerosis Brother     ALLERGIES:  has No Known Allergies.  MEDICATIONS:  Current Outpatient Medications  Medication Sig Dispense Refill  . BREO ELLIPTA 100-25 MCG/INH AEPB Inhale 1 puff into the lungs at bedtime.    . chlorpheniramine-HYDROcodone (TUSSIONEX) 10-8 MG/5ML SUER Take 5 mLs by mouth at bedtime as needed for cough. 140 mL 0  . lidocaine-prilocaine (EMLA) cream Apply 1 application topically as needed. 30 g 3  . ondansetron (ZOFRAN) 8 MG tablet One pill every 8 hours  as needed for nausea/vomitting. 40 tablet 1  . potassium chloride SA (KLOR-CON M20) 20 MEQ tablet TAKE 1 TABLET BY MOUTH TWICE A DAY 60 tablet 3  . predniSONE (DELTASONE) 20 MG tablet Take 3 pills once a day x 3 days; 2 pills/day for 3 days; and the once a day. Take with Breakfast. 30 tablet 0  . prochlorperazine (COMPAZINE) 10 MG tablet Take 1 tablet (10 mg total) by mouth every 6 (six) hours as needed for nausea or vomiting. 40 tablet 1  . tiotropium (SPIRIVA) 18 MCG inhalation capsule Place 18 mcg into inhaler and inhale at bedtime.      No current facility-administered medications for this visit.   Facility-Administered Medications Ordered in Other Visits  Medication Dose Route Frequency Provider Last Rate Last Admin  . heparin lock flush 100 unit/mL  500 Units Intravenous Once Charlaine Dalton R, MD          .  PHYSICAL EXAMINATION: ECOG PERFORMANCE STATUS: 1 - Symptomatic but completely ambulatory  There were no vitals filed for this visit. There were no vitals filed for this visit.  Physical Exam Constitutional:      Comments: Elderly male patient.  Patient is in a wheelchair.  Accompanied by his son.  HENT:     Head: Normocephalic and atraumatic.     Mouth/Throat:     Pharynx: No oropharyngeal exudate.  Eyes:     Pupils: Pupils are equal, round, and reactive to light.  Cardiovascular:     Rate and Rhythm: Normal rate and regular rhythm.  Pulmonary:     Effort: No respiratory distress.     Breath sounds: No wheezing.     Comments: Decreased air entry bilaterally.  No wheeze or crackles Abdominal:     General: Bowel sounds are normal. There is no distension.     Palpations: Abdomen is soft. There is no mass.     Tenderness: There is no abdominal tenderness. There is no guarding or rebound.  Musculoskeletal:        General: No tenderness. Normal range of motion.     Cervical back: Normal range of motion and neck supple.  Skin:    General: Skin is warm.   Neurological:     Mental Status: He is alert and oriented to person, place, and time.  Psychiatric:        Mood and Affect: Affect normal.      LABORATORY DATA:  I have reviewed the data as listed Lab Results  Component Value Date   WBC 10.2 09/17/2020   HGB 12.0 (L) 09/17/2020   HCT 37.0 (L) 09/17/2020   MCV 90.5 09/17/2020   PLT 209 09/17/2020   Recent Labs    07/23/20  8377 07/23/20 9396 08/06/20 8864 08/06/20 0852 08/20/20 0821 09/03/20 1033 09/17/20 1039  NA 141   < > 139   < > 138 141 141  K 3.5   < > 4.8   < > 4.2 4.2 3.9  CL 109   < > 104   < > 107 109 106  CO2 23   < > 28   < > '25 27 30  ' GLUCOSE 137*   < > 103*   < > 101* 92 104*  BUN 12   < > 14   < > '14 9 15  ' CREATININE 0.81   < > 0.84   < > 0.92 0.81 0.97  CALCIUM 8.2*   < > 8.8*   < > 8.8* 9.0 8.7*  GFRNONAA >60   < > >60   < > >60 >60 >60  GFRAA >60  --  >60  --  >60  --   --   PROT 6.4*   < > 7.2   < > 6.7 7.1 6.3*  ALBUMIN 2.9*   < > 3.3*   < > 3.4* 3.6 3.4*  AST 19   < > 39   < > '26 25 22  ' ALT 22   < > 35   < > '22 20 18  ' ALKPHOS 64   < > 95   < > 59 56 52  BILITOT 1.0   < > 1.0   < > 0.9 1.3* 1.2   < > = values in this interval not displayed.    RADIOGRAPHIC STUDIES: I have personally reviewed the radiological images as listed and agreed with the findings in the report. No results found.  ASSESSMENT & PLAN:   No problem-specific Assessment & Plan notes found for this encounter.  All questions were answered. The patient knows to call the clinic with any problems, questions or concerns.    Cammie Sickle, MD 10/01/2020 8:37 AM

## 2020-10-01 NOTE — Progress Notes (Signed)
1135- Patient tolerated treatment well. Patient discharged to home with Fluorouracil Pump in place.

## 2020-10-01 NOTE — Assessment & Plan Note (Addendum)
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  PET scan-no evidence of distant site disease. On adjuvant CIV 5FU. STABLE  # proceed with CIV 5FU. Labs today reviewed;  acceptable for treatment today.   # Diarrhea- G-1; STABLE;   sec to 5FU; monitor for now.  # Iron defciency anemia-s/p Venofer; Hb 12.7;STABLE. .   # COPD- STABLE.  continue inhalers.    # DISPOSITION: # 5FU CIV  pump off d-3; # follow up in 2 weeks- MD; labs- cbc/cmp;CEA; 5FU CIV only; D-3 pump off; Dr.B

## 2020-10-01 NOTE — Addendum Note (Signed)
Addended by: Delice Bison E on: 10/01/2020 01:55 PM   Modules accepted: Orders

## 2020-10-02 LAB — CEA: CEA: 16.5 ng/mL — ABNORMAL HIGH (ref 0.0–4.7)

## 2020-10-03 ENCOUNTER — Inpatient Hospital Stay: Payer: Medicare HMO

## 2020-10-03 ENCOUNTER — Other Ambulatory Visit: Payer: Self-pay

## 2020-10-03 DIAGNOSIS — Z95828 Presence of other vascular implants and grafts: Secondary | ICD-10-CM

## 2020-10-03 DIAGNOSIS — Z5111 Encounter for antineoplastic chemotherapy: Secondary | ICD-10-CM | POA: Diagnosis not present

## 2020-10-03 MED ORDER — HEPARIN SOD (PORK) LOCK FLUSH 100 UNIT/ML IV SOLN
500.0000 [IU] | Freq: Once | INTRAVENOUS | Status: AC
  Administered 2020-10-03: 500 [IU] via INTRAVENOUS
  Filled 2020-10-03: qty 5

## 2020-10-14 ENCOUNTER — Inpatient Hospital Stay: Payer: Medicare HMO

## 2020-10-14 ENCOUNTER — Other Ambulatory Visit: Payer: Self-pay

## 2020-10-14 ENCOUNTER — Inpatient Hospital Stay (HOSPITAL_BASED_OUTPATIENT_CLINIC_OR_DEPARTMENT_OTHER): Payer: Medicare HMO | Admitting: Internal Medicine

## 2020-10-14 ENCOUNTER — Encounter: Payer: Self-pay | Admitting: Internal Medicine

## 2020-10-14 VITALS — BP 124/60 | HR 57 | Temp 97.4°F | Resp 16 | Ht 75.0 in | Wt 226.8 lb

## 2020-10-14 DIAGNOSIS — C182 Malignant neoplasm of ascending colon: Secondary | ICD-10-CM | POA: Diagnosis not present

## 2020-10-14 DIAGNOSIS — Z95828 Presence of other vascular implants and grafts: Secondary | ICD-10-CM

## 2020-10-14 DIAGNOSIS — Z7189 Other specified counseling: Secondary | ICD-10-CM

## 2020-10-14 DIAGNOSIS — Z5111 Encounter for antineoplastic chemotherapy: Secondary | ICD-10-CM | POA: Diagnosis not present

## 2020-10-14 LAB — CBC WITH DIFFERENTIAL/PLATELET
Abs Immature Granulocytes: 0.04 10*3/uL (ref 0.00–0.07)
Basophils Absolute: 0.1 10*3/uL (ref 0.0–0.1)
Basophils Relative: 1 %
Eosinophils Absolute: 0.3 10*3/uL (ref 0.0–0.5)
Eosinophils Relative: 3 %
HCT: 39.5 % (ref 39.0–52.0)
Hemoglobin: 12.9 g/dL — ABNORMAL LOW (ref 13.0–17.0)
Immature Granulocytes: 0 %
Lymphocytes Relative: 28 %
Lymphs Abs: 2.9 10*3/uL (ref 0.7–4.0)
MCH: 30.9 pg (ref 26.0–34.0)
MCHC: 32.7 g/dL (ref 30.0–36.0)
MCV: 94.5 fL (ref 80.0–100.0)
Monocytes Absolute: 1 10*3/uL (ref 0.1–1.0)
Monocytes Relative: 10 %
Neutro Abs: 5.9 10*3/uL (ref 1.7–7.7)
Neutrophils Relative %: 58 %
Platelets: 237 10*3/uL (ref 150–400)
RBC: 4.18 MIL/uL — ABNORMAL LOW (ref 4.22–5.81)
RDW: 17.7 % — ABNORMAL HIGH (ref 11.5–15.5)
WBC: 10.1 10*3/uL (ref 4.0–10.5)
nRBC: 0 % (ref 0.0–0.2)

## 2020-10-14 LAB — COMPREHENSIVE METABOLIC PANEL
ALT: 16 U/L (ref 0–44)
AST: 20 U/L (ref 15–41)
Albumin: 3.6 g/dL (ref 3.5–5.0)
Alkaline Phosphatase: 47 U/L (ref 38–126)
Anion gap: 7 (ref 5–15)
BUN: 14 mg/dL (ref 8–23)
CO2: 28 mmol/L (ref 22–32)
Calcium: 9.1 mg/dL (ref 8.9–10.3)
Chloride: 105 mmol/L (ref 98–111)
Creatinine, Ser: 0.85 mg/dL (ref 0.61–1.24)
GFR, Estimated: >60 mL/min (ref >60)
Glucose, Bld: 105 mg/dL — ABNORMAL HIGH (ref 70–99)
Potassium: 3.9 mmol/L (ref 3.5–5.1)
Sodium: 140 mmol/L (ref 135–145)
Total Bilirubin: 1.5 mg/dL — ABNORMAL HIGH (ref 0.3–1.2)
Total Protein: 6.9 g/dL (ref 6.5–8.1)

## 2020-10-14 MED ORDER — SODIUM CHLORIDE 0.9% FLUSH
10.0000 mL | Freq: Once | INTRAVENOUS | Status: AC
  Administered 2020-10-14: 10 mL via INTRAVENOUS
  Filled 2020-10-14: qty 10

## 2020-10-14 MED ORDER — SODIUM CHLORIDE 0.9 % IV SOLN
INTRAVENOUS | Status: DC
  Filled 2020-10-14: qty 250

## 2020-10-14 MED ORDER — HEPARIN SOD (PORK) LOCK FLUSH 100 UNIT/ML IV SOLN
500.0000 [IU] | Freq: Once | INTRAVENOUS | Status: DC
  Filled 2020-10-14: qty 5

## 2020-10-14 MED ORDER — SODIUM CHLORIDE 0.9 % IV SOLN
2400.0000 mg/m2 | INTRAVENOUS | Status: DC
  Administered 2020-10-14: 5400 mg via INTRAVENOUS
  Filled 2020-10-14: qty 108

## 2020-10-14 MED ORDER — SODIUM CHLORIDE 0.9 % IV SOLN
10.0000 mg | Freq: Once | INTRAVENOUS | Status: AC
  Administered 2020-10-14: 10 mg via INTRAVENOUS
  Filled 2020-10-14: qty 10

## 2020-10-14 NOTE — Assessment & Plan Note (Signed)
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  PET scan-no evidence of distant site disease. On adjuvant CIV 5FU. STABLE; see below UO:[id number] CEA  # proceed with CIV 5FU. Labs today reviewed;  acceptable for treatment today.   # Elevated CEA-17; recommend CT C/A/P prior to next visit.   # Diarrhea- G-1; STABLE; sec to 5FU; monitor for now.  # Iron defciency anemia-s/p Venofer; Hb 12.9; STABLE.    # COPD- STABLE;  continue inhalers.   # DISPOSITION: # 5FU CIV  pump off d-3; # follow up in 2 weeks- MD; labs- cbc/cmp;CEA; 5FU CIV only; D-3 pump off;CT C/A/P  Dr.B

## 2020-10-14 NOTE — Progress Notes (Signed)
[facility] NOTE  Patient Care Team: Albina Billet, MD as PCP - General (Internal Medicine) Clent Jacks, RN as Oncology Nurse Navigator Cammie Sickle, MD as Consulting Physician (Hematology and Oncology)  CHIEF COMPLAINTS/PURPOSE OF CONSULTATION: Colon cancer  #  Oncology History Overview Note  # July 2021- RIGHT COLON ADENO CARCINOMA- Dr.Byrnett s/p right hemicolectomy- pT4b N2b- STAGE III [high risk]; MSI-STABLE.   # AUG 10th, 2021- FOLFOX [adjuvant]  -------------------------------------------------------------------------------------------   - INVASIVE MODERATELY DIFFERENTIATED ADENOCARCINOMA.  - TEN OF FIFTY-TWO LYMPH NODES POSITIVE FOR METASTATIC ADENOCARCINOMA  (10/52).  - SINGLE INCIDENTAL TUBULOVILLOUS ADENOMA.  - THREE INCIDENTAL TUBULAR ADENOMAS.  - SEE CANCER SUMMARY.   CANCER CASE SUMMARY: COLON AND RECTUM  Procedure: Right hemicolectomy  Tumor Site: Cecum  Tumor Size: Greatest dimension: 12.2 cm  Macroscopic Tumor Perforation: Present  Histologic Type: Adenocarcinoma  Histologic Grade: G2: Moderately differentiated  Tumor Extension: Tumor directly invades adjacent structures (terminal  ileum)  Margins: All margins are uninvolved by invasive carcinoma, high-grade  dysplasia/intramucosal adenocarcinoma, and low-grade dysplasia  Treatment Effect: No known presurgical therapy  Lymphovascular Invasion: Present  Perineural Invasion: Present  Tumor Deposits: Present: Multiple  Regional Lymph Nodes:       Number of Lymph Nodes Involved: 10       Number of Lymph Nodes Examined: 41  -  # COPD- [Dr.Fleming];   # SURVIVORSHIP:   # GENETICS:   DIAGNOSIS: colon cancer  STAGE:   III      ;  GOALS:cure  CURRENT/MOST RECENT THERAPY : FOLFOX     Cancer of right colon ([place])  05/20/2020 Initial Diagnosis   Cancer of right colon ([place])   07/02/2020 -  Chemotherapy   The patient had dexamethasone (DECADRON) 4 MG tablet, 8 mg,  Oral, Daily, 1 of 1 cycle, Start date: --, End date: -- palonosetron (ALOXI) injection 0.25 mg, 0.25 mg, Intravenous,  Once, 1 of 1 cycle Administration: 0.25 mg (07/02/2020) leucovorin 904 mg in dextrose 5 % 250 mL infusion, 400 mg/m2 = 904 mg, Intravenous,  Once, 1 of 1 cycle oxaliplatin (ELOXATIN) 190 mg in dextrose 5 % 500 mL chemo infusion, 85 mg/m2 = 190 mg, Intravenous,  Once, 1 of 1 cycle Administration: 190 mg (07/02/2020) fluorouracil (ADRUCIL) 5,400 mg in sodium chloride 0.9 % 142 mL chemo infusion, 2,400 mg/m2 = 5,400 mg, Intravenous, 1 Day/Dose, 8 of 12 cycles Administration: 5,400 mg (07/02/2020), 5,400 mg (07/23/2020), 5,400 mg (08/06/2020), 5,400 mg (08/20/2020), 5,400 mg (09/03/2020), 5,400 mg (09/17/2020), 5,400 mg (10/01/2020), 5,400 mg (10/14/2020)  for chemotherapy treatment.       HISTORY OF PRESENTING ILLNESS:  James Hodge 74 y.o.  male stage III colon cancer-high risk currently on adjuvant continuous 5-FU infusion is here for follow-up.  Denies any nausea vomiting.  Chronic mild diarrhea 1-2 loose stools every other day.  Chronic shortness of breath mild cough.  Not any worse.  No dizzy spells no falls.  No tingling or numbness.  No abdominal pain.  No back pain.  Review of Systems  Constitutional: Positive for malaise/fatigue. Negative for chills, diaphoresis and fever.  HENT: Negative for nosebleeds and sore throat.   Eyes: Negative for double vision.  Respiratory: Positive for cough and shortness of breath. Negative for hemoptysis, sputum production and wheezing.   Cardiovascular: Negative for chest pain, palpitations, orthopnea and leg swelling.  Gastrointestinal: Negative for abdominal pain, blood in stool, constipation, diarrhea, heartburn, melena, nausea and vomiting.  Genitourinary: Negative for dysuria, frequency  and urgency.  Musculoskeletal: Negative for back pain and joint pain.  Skin: Negative.  Negative for itching and rash.  Neurological: Negative for  dizziness, focal weakness, weakness and headaches.  Endo/Heme/Allergies: Does not bruise/bleed easily.  Psychiatric/Behavioral: Negative for depression. The patient is not nervous/anxious and does not have insomnia.      MEDICAL HISTORY:  Past Medical History:  Diagnosis Date  . Cancer ([place])   . COPD (chronic obstructive pulmonary disease) ([place])   . Hypertension     SURGICAL HISTORY: Past Surgical History:  Procedure Laterality Date  . CHOLECYSTECTOMY     Around 2001  . COLONOSCOPY WITH PROPOFOL N/A 04/12/2020   Procedure: COLONOSCOPY WITH PROPOFOL;  Surgeon: Robert Bellow, MD;  Location: [facility] ENDOSCOPY;  Service: Endoscopy;  Laterality: N/A;  . COLONOSCOPY WITH PROPOFOL N/A 04/24/2020   Procedure: COLONOSCOPY WITH PROPOFOL;  Surgeon: Robert Bellow, MD;  Location: [facility] ENDOSCOPY;  Service: Endoscopy;  Laterality: N/A;  . ESOPHAGOGASTRODUODENOSCOPY (EGD) WITH PROPOFOL N/A 04/12/2020   Procedure: ESOPHAGOGASTRODUODENOSCOPY (EGD) WITH PROPOFOL;  Surgeon: Robert Bellow, MD;  Location: [facility] ENDOSCOPY;  Service: Endoscopy;  Laterality: N/A;  . EYE SURGERY Bilateral    cataract  . LAPAROSCOPIC PARTIAL COLECTOMY Right 05/20/2020   Procedure: LAPAROSCOPIC PARTIAL COLECTOMY;  Surgeon: Robert Bellow, MD;  Location: [facility] ORS;  Service: General;  Laterality: Right;  Floyce Stakes, RNFA to assist  . PORTACATH PLACEMENT Left 06/19/2020   Procedure: INSERTION PORT-A-CATH;  Surgeon: Robert Bellow, MD;  Location: [facility] ORS;  Service: General;  Laterality: Left;    SOCIAL HISTORY: Social History   Socioeconomic History  . Marital status: Married    Spouse name: Not on file  . Number of children: Not on file  . Years of education: Not on file  . Highest education level: Not on file  Occupational History  . Not on file  Tobacco Use  . Smoking status: Former Smoker    Years: 3.00    Types: Cigarettes    Quit date: 11/23/1968    Years since quitting: 51.9  . Smokeless  tobacco: Never Used  Vaping Use  . Vaping Use: Never used  Substance and Sexual Activity  . Alcohol use: Not Currently  . Drug use: Never  . Sexual activity: Not on file  Other Topics Concern  . Not on file  Social History Narrative   Lives in [place]; smoked- quit in 70s; beer social; 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; grandson    Social Determinants of Health   Financial Resource Strain:   . Difficulty of Paying Living Expenses: Not on file  Food Insecurity:   . Worried About Charity fundraiser in the Last Year: Not on file  . Ran Out of Food in the Last Year: Not on file  Transportation Needs:   . Lack of Transportation (Medical): Not on file  . Lack of Transportation (Non-Medical): Not on file  Physical Activity:   . Days of Exercise per Week: Not on file  . Minutes of Exercise per Session: Not on file  Stress:   . Feeling of Stress : Not on file  Social Connections:   . Frequency of Communication with Friends and Family: Not on file  . Frequency of Social Gatherings with Friends and Family: Not on file  . Attends Religious Services: Not on file  . Active Member of Clubs or Organizations: Not on file  . Attends Archivist Meetings: Not on file  . Marital Status: Not on file  Intimate Partner Violence:   . Fear of Current or Ex-Partner: Not on file  . Emotionally Abused: Not on file  . Physically Abused: Not on file  . Sexually Abused: Not on file    FAMILY HISTORY: Family History  Problem Relation Age of Onset  . Multiple sclerosis Brother     ALLERGIES:  has No Known Allergies.  MEDICATIONS:  Current Outpatient Medications  Medication Sig Dispense Refill  . BREO ELLIPTA 100-25 MCG/INH AEPB Inhale 1 puff into the lungs at bedtime.    . lidocaine-prilocaine (EMLA) cream Apply 1 application topically as needed. 30 g 3  . lisinopril (ZESTRIL) 10 MG tablet     . ondansetron (ZOFRAN) 8 MG tablet One pill every 8 hours as needed for nausea/vomitting. 40  tablet 1  . potassium chloride SA (KLOR-CON M20) 20 MEQ tablet TAKE 1 TABLET BY MOUTH TWICE A DAY 60 tablet 3  . predniSONE (DELTASONE) 20 MG tablet Take 3 pills once a day x 3 days; 2 pills/day for 3 days; and the once a day. Take with Breakfast. 30 tablet 0  . prochlorperazine (COMPAZINE) 10 MG tablet Take 1 tablet (10 mg total) by mouth every 6 (six) hours as needed for nausea or vomiting. 40 tablet 1  . tiotropium (SPIRIVA) 18 MCG inhalation capsule Place 18 mcg into inhaler and inhale at bedtime.      No current facility-administered medications for this visit.   Facility-Administered Medications Ordered in Other Visits  Medication Dose Route Frequency Provider Last Rate Last Admin  . heparin lock flush 100 unit/mL  500 Units Intravenous Once Charlaine Dalton R, MD          .  PHYSICAL EXAMINATION: ECOG PERFORMANCE STATUS: 1 - Symptomatic but completely ambulatory  Vitals:   10/14/20 0901  BP: 124/60  Pulse: (!) 57  Resp: 16  Temp: (!) 97.4 F (36.3 C)  SpO2: 100%   Filed Weights   10/14/20 0901  Weight: 226 lb 12.8 oz (102.9 kg)    Physical Exam Constitutional:      Comments: Elderly male patient.  Patient is in a wheelchair.  Accompanied by his son.  HENT:     Head: Normocephalic and atraumatic.     Mouth/Throat:     Pharynx: No oropharyngeal exudate.  Eyes:     Pupils: Pupils are equal, round, and reactive to light.  Cardiovascular:     Rate and Rhythm: Normal rate and regular rhythm.  Pulmonary:     Effort: No respiratory distress.     Breath sounds: No wheezing.     Comments: Decreased air entry bilaterally.  No wheeze or crackles Abdominal:     General: Bowel sounds are normal. There is no distension.     Palpations: Abdomen is soft. There is no mass.     Tenderness: There is no abdominal tenderness. There is no guarding or rebound.  Musculoskeletal:        General: No tenderness. Normal range of motion.     Cervical back: Normal range of motion and  neck supple.  Skin:    General: Skin is warm.  Neurological:     Mental Status: He is alert and oriented to person, place, and time.  Psychiatric:        Mood and Affect: Affect normal.      LABORATORY DATA:  I have reviewed the data as listed Lab Results  Component Value Date   WBC 10.1 10/14/2020   HGB 12.9 (L) 10/14/2020  HCT 39.5 10/14/2020   MCV 94.5 10/14/2020   PLT 237 10/14/2020   Recent Labs    07/23/20 0829 07/23/20 0829 08/06/20 0852 08/06/20 0852 08/20/20 0821 09/03/20 1033 09/17/20 1039 10/01/20 0946 10/14/20 0851  NA 141   < > 139   < > 138   < > 141 142 140  K 3.5   < > 4.8   < > 4.2   < > 3.9 3.8 3.9  CL 109   < > 104   < > 107   < > 106 108 105  CO2 23   < > 28   < > 25   < > '30 27 28  ' GLUCOSE 137*   < > 103*   < > 101*   < > 104* 109* 105*  BUN 12   < > 14   < > 14   < > '15 17 14  ' CREATININE 0.81   < > 0.84   < > 0.92   < > 0.97 1.02 0.85  CALCIUM 8.2*   < > 8.8*   < > 8.8*   < > 8.7* 8.9 9.1  GFRNONAA >60   < > >60   < > >60   < > >60 >60 >60  GFRAA >60  --  >60  --  >60  --   --   --   --   PROT 6.4*   < > 7.2   < > 6.7   < > 6.3* 6.5 6.9  ALBUMIN 2.9*   < > 3.3*   < > 3.4*   < > 3.4* 3.5 3.6  AST 19   < > 39   < > 26   < > '22 22 20  ' ALT 22   < > 35   < > 22   < > '18 19 16  ' ALKPHOS 64   < > 95   < > 59   < > 52 51 47  BILITOT 1.0   < > 1.0   < > 0.9   < > 1.2 1.2 1.5*   < > = values in this interval not displayed.    RADIOGRAPHIC STUDIES: I have personally reviewed the radiological images as listed and agreed with the findings in the report. No results found.  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  PET scan-no evidence of distant site disease. On adjuvant CIV 5FU. STABLE; see below YQ:[id number] CEA  # proceed with CIV 5FU. Labs today reviewed;  acceptable for treatment today.   # Elevated CEA-17; recommend CT C/A/P prior to next visit.   # Diarrhea- G-1; STABLE; sec to 5FU;  monitor for now.  # Iron defciency anemia-s/p Venofer; Hb 12.9; STABLE.    # COPD- STABLE;  continue inhalers.   # DISPOSITION: # 5FU CIV  pump off d-3; # follow up in 2 weeks- MD; labs- cbc/cmp;CEA; 5FU CIV only; D-3 pump off;CT C/A/P  Dr.B   All questions were answered. The patient knows to call the clinic with any problems, questions or concerns.    Cammie Sickle, MD 10/21/2020 7:11 AM

## 2020-10-15 ENCOUNTER — Ambulatory Visit: Payer: Medicare HMO

## 2020-10-15 ENCOUNTER — Ambulatory Visit: Payer: Medicare HMO | Admitting: Internal Medicine

## 2020-10-15 ENCOUNTER — Other Ambulatory Visit: Payer: Medicare HMO

## 2020-10-15 LAB — CEA: CEA: 19.5 ng/mL — ABNORMAL HIGH (ref 0.0–4.7)

## 2020-10-16 ENCOUNTER — Inpatient Hospital Stay: Payer: Medicare HMO

## 2020-10-16 ENCOUNTER — Other Ambulatory Visit: Payer: Self-pay

## 2020-10-16 DIAGNOSIS — Z7189 Other specified counseling: Secondary | ICD-10-CM

## 2020-10-16 DIAGNOSIS — C182 Malignant neoplasm of ascending colon: Secondary | ICD-10-CM

## 2020-10-16 DIAGNOSIS — Z5111 Encounter for antineoplastic chemotherapy: Secondary | ICD-10-CM | POA: Diagnosis not present

## 2020-10-16 MED ORDER — HEPARIN SOD (PORK) LOCK FLUSH 100 UNIT/ML IV SOLN
500.0000 [IU] | Freq: Once | INTRAVENOUS | Status: AC | PRN
  Administered 2020-10-16: 500 [IU]
  Filled 2020-10-16: qty 5

## 2020-10-16 MED ORDER — SODIUM CHLORIDE 0.9% FLUSH
10.0000 mL | INTRAVENOUS | Status: DC | PRN
  Administered 2020-10-16: 10 mL
  Filled 2020-10-16: qty 10

## 2020-10-21 ENCOUNTER — Ambulatory Visit
Admission: RE | Admit: 2020-10-21 | Discharge: 2020-10-21 | Disposition: A | Discharge status: Home / Self Care | Payer: Medicare HMO | Source: Ambulatory Visit | Attending: Internal Medicine | Admitting: Internal Medicine

## 2020-10-21 ENCOUNTER — Other Ambulatory Visit: Payer: Self-pay

## 2020-10-21 DIAGNOSIS — C182 Malignant neoplasm of ascending colon: Secondary | ICD-10-CM | POA: Diagnosis present

## 2020-10-21 MED ORDER — IOHEXOL 300 MG/ML  SOLN
100.0000 mL | Freq: Once | INTRAMUSCULAR | Status: AC | PRN
  Administered 2020-10-21: 100 mL via INTRAVENOUS

## 2020-10-28 ENCOUNTER — Inpatient Hospital Stay (HOSPITAL_BASED_OUTPATIENT_CLINIC_OR_DEPARTMENT_OTHER): Payer: Medicare HMO | Admitting: Internal Medicine

## 2020-10-28 ENCOUNTER — Inpatient Hospital Stay: Payer: Medicare HMO | Attending: Internal Medicine

## 2020-10-28 ENCOUNTER — Inpatient Hospital Stay: Payer: Medicare HMO

## 2020-10-28 ENCOUNTER — Encounter: Payer: Self-pay | Admitting: Internal Medicine

## 2020-10-28 ENCOUNTER — Other Ambulatory Visit: Payer: Self-pay

## 2020-10-28 DIAGNOSIS — Z87891 Personal history of nicotine dependence: Secondary | ICD-10-CM | POA: Insufficient documentation

## 2020-10-28 DIAGNOSIS — I10 Essential (primary) hypertension: Secondary | ICD-10-CM | POA: Diagnosis not present

## 2020-10-28 DIAGNOSIS — J449 Chronic obstructive pulmonary disease, unspecified: Secondary | ICD-10-CM | POA: Diagnosis not present

## 2020-10-28 DIAGNOSIS — C182 Malignant neoplasm of ascending colon: Secondary | ICD-10-CM | POA: Insufficient documentation

## 2020-10-28 DIAGNOSIS — Z79899 Other long term (current) drug therapy: Secondary | ICD-10-CM | POA: Insufficient documentation

## 2020-10-28 DIAGNOSIS — Z7189 Other specified counseling: Secondary | ICD-10-CM

## 2020-10-28 DIAGNOSIS — N400 Enlarged prostate without lower urinary tract symptoms: Secondary | ICD-10-CM | POA: Insufficient documentation

## 2020-10-28 DIAGNOSIS — Z5111 Encounter for antineoplastic chemotherapy: Secondary | ICD-10-CM | POA: Insufficient documentation

## 2020-10-28 DIAGNOSIS — R970 Elevated carcinoembryonic antigen [CEA]: Secondary | ICD-10-CM | POA: Insufficient documentation

## 2020-10-28 LAB — COMPREHENSIVE METABOLIC PANEL
ALT: 16 U/L (ref 0–44)
AST: 18 U/L (ref 15–41)
Albumin: 3.7 g/dL (ref 3.5–5.0)
Alkaline Phosphatase: 53 U/L (ref 38–126)
Anion gap: 7 (ref 5–15)
BUN: 11 mg/dL (ref 8–23)
CO2: 28 mmol/L (ref 22–32)
Calcium: 9 mg/dL (ref 8.9–10.3)
Chloride: 104 mmol/L (ref 98–111)
Creatinine, Ser: 0.89 mg/dL (ref 0.61–1.24)
GFR, Estimated: >60 mL/min (ref >60)
Glucose, Bld: 101 mg/dL — ABNORMAL HIGH (ref 70–99)
Potassium: 4.2 mmol/L (ref 3.5–5.1)
Sodium: 139 mmol/L (ref 135–145)
Total Bilirubin: 1.5 mg/dL — ABNORMAL HIGH (ref 0.3–1.2)
Total Protein: 6.7 g/dL (ref 6.5–8.1)

## 2020-10-28 LAB — CBC WITH DIFFERENTIAL/PLATELET
Abs Immature Granulocytes: 0.03 10*3/uL (ref 0.00–0.07)
Basophils Absolute: 0.1 10*3/uL (ref 0.0–0.1)
Basophils Relative: 1 %
Eosinophils Absolute: 0.4 10*3/uL (ref 0.0–0.5)
Eosinophils Relative: 4 %
HCT: 41.1 % (ref 39.0–52.0)
Hemoglobin: 13.6 g/dL (ref 13.0–17.0)
Immature Granulocytes: 0 %
Lymphocytes Relative: 24 %
Lymphs Abs: 2.5 10*3/uL (ref 0.7–4.0)
MCH: 32 pg (ref 26.0–34.0)
MCHC: 33.1 g/dL (ref 30.0–36.0)
MCV: 96.7 fL (ref 80.0–100.0)
Monocytes Absolute: 1 10*3/uL (ref 0.1–1.0)
Monocytes Relative: 10 %
Neutro Abs: 6.3 10*3/uL (ref 1.7–7.7)
Neutrophils Relative %: 61 %
Platelets: 295 10*3/uL (ref 150–400)
RBC: 4.25 MIL/uL (ref 4.22–5.81)
RDW: 16.4 % — ABNORMAL HIGH (ref 11.5–15.5)
WBC: 10.4 10*3/uL (ref 4.0–10.5)
nRBC: 0 % (ref 0.0–0.2)

## 2020-10-28 MED ORDER — HEPARIN SOD (PORK) LOCK FLUSH 100 UNIT/ML IV SOLN
INTRAVENOUS | Status: AC
  Filled 2020-10-28: qty 5

## 2020-10-28 MED ORDER — SODIUM CHLORIDE 0.9% FLUSH
10.0000 mL | Freq: Once | INTRAVENOUS | Status: AC
  Administered 2020-10-28: 10 mL via INTRAVENOUS
  Filled 2020-10-28: qty 10

## 2020-10-28 MED ORDER — SODIUM CHLORIDE 0.9 % IV SOLN
10.0000 mg | Freq: Once | INTRAVENOUS | Status: AC
  Administered 2020-10-28: 10 mg via INTRAVENOUS
  Filled 2020-10-28: qty 10

## 2020-10-28 MED ORDER — DEXTROSE 5 % IV SOLN
Freq: Once | INTRAVENOUS | Status: AC
  Filled 2020-10-28: qty 250

## 2020-10-28 MED ORDER — SODIUM CHLORIDE 0.9 % IV SOLN
2400.0000 mg/m2 | INTRAVENOUS | Status: DC
  Administered 2020-10-28: 5400 mg via INTRAVENOUS
  Filled 2020-10-28: qty 108

## 2020-10-28 NOTE — Progress Notes (Signed)
[place] NOTE  Patient Care Team: Albina Billet, MD as PCP - General (Internal Medicine) Clent Jacks, RN as Oncology Nurse Navigator Cammie Sickle, MD as Consulting Physician (Hematology and Oncology)  CHIEF COMPLAINTS/PURPOSE OF CONSULTATION: Colon cancer  #  Oncology History Overview Note  # July 2021- RIGHT COLON ADENO CARCINOMA- Dr.Byrnett s/p right hemicolectomy- pT4b N2b- STAGE III [high risk]; [facility]SI-STABLE.   # AUG 10th, 2021- FOLFOX [adjuvant]  -------------------------------------------------------------------------------------------   - INVASIVE MODERATELY DIFFERENTIATED ADENOCARCINOMA.  - TEN OF FIFTY-TWO LYMPH NODES POSITIVE FOR METASTATIC ADENOCARCINOMA  (10/52).  - SINGLE INCIDENTAL TUBULOVILLOUS ADENOMA.  - THREE INCIDENTAL TUBULAR ADENOMAS.  - SEE CANCER SUMMARY.   CANCER CASE SUMMARY: COLON AND RECTUM  Procedure: Right hemicolectomy  Tumor Site: Cecum  Tumor Size: Greatest dimension: 12.2 cm  Macroscopic Tumor Perforation: Present  Histologic Type: Adenocarcinoma  Histologic Grade: G2: Moderately differentiated  Tumor Extension: Tumor directly invades adjacent structures (terminal  ileum)  Margins: All margins are uninvolved by invasive carcinoma, high-grade  dysplasia/intramucosal adenocarcinoma, and low-grade dysplasia  Treatment Effect: No known presurgical therapy  Lymphovascular Invasion: Present  Perineural Invasion: Present  Tumor Deposits: Present: Multiple  Regional Lymph Nodes:       Number of Lymph Nodes Involved: 10       Number of Lymph Nodes Examined: 47  -  # COPD- [Dr.Fleming];   # SURVIVORSHIP:   # GENETICS:   DIAGNOSIS: colon cancer  STAGE:   III      ;  GOALS:cure  CURRENT/MOST RECENT THERAPY : FOLFOX     Cancer of right colon ([place])  05/20/2020 Initial Diagnosis   Cancer of right colon ([place])   07/02/2020 -  Chemotherapy   The patient had dexamethasone (DECADRON) 4 MG tablet, 8 mg,  Oral, Daily, 1 of 1 cycle, Start date: --, End date: -- palonosetron (ALOXI) injection 0.25 mg, 0.25 mg, Intravenous,  Once, 1 of 1 cycle Administration: 0.25 mg (07/02/2020) leucovorin 904 mg in dextrose 5 % 250 mL infusion, 400 mg/m2 = 904 mg, Intravenous,  Once, 1 of 1 cycle oxaliplatin (ELOXATIN) 190 mg in dextrose 5 % 500 mL chemo infusion, 85 mg/m2 = 190 mg, Intravenous,  Once, 1 of 1 cycle Administration: 190 mg (07/02/2020) fluorouracil (ADRUCIL) 5,400 mg in sodium chloride 0.9 % 142 mL chemo infusion, 2,400 mg/m2 = 5,400 mg, Intravenous, 1 Day/Dose, 8 of 12 cycles Administration: 5,400 mg (07/02/2020), 5,400 mg (07/23/2020), 5,400 mg (08/06/2020), 5,400 mg (08/20/2020), 5,400 mg (09/03/2020), 5,400 mg (09/17/2020), 5,400 mg (10/01/2020), 5,400 mg (10/14/2020)  for chemotherapy treatment.       HISTORY OF PRESENTING ILLNESS:  James Hodge 74 y.o.  male stage III colon cancer-high risk currently on adjuvant continuous 5-FU infusion is here for follow-up/review results of the CT scan given the rising CEA.  Patient denies any abdominal pain.  Denies any nausea vomiting.  Continues to have chronic mild diarrhea 1-2 loose stools every other day.  Chronic shortness of breath chronic cough not any worse.  Denies any increased frequency of urination or poor stream or any difficulty with urination.  Review of Systems  Constitutional: Positive for malaise/fatigue. Negative for chills, diaphoresis and fever.  HENT: Negative for nosebleeds and sore throat.   Eyes: Negative for double vision.  Respiratory: Positive for cough and shortness of breath. Negative for hemoptysis, sputum production and wheezing.   Cardiovascular: Negative for chest pain, palpitations, orthopnea and leg swelling.  Gastrointestinal: Negative for abdominal pain, blood in stool,  constipation, diarrhea, heartburn, melena, nausea and vomiting.  Genitourinary: Negative for dysuria, frequency and urgency.  Musculoskeletal: Negative  for back pain and joint pain.  Skin: Negative.  Negative for itching and rash.  Neurological: Negative for dizziness, focal weakness, weakness and headaches.  Endo/Heme/Allergies: Does not bruise/bleed easily.  Psychiatric/Behavioral: Negative for depression. The patient is not nervous/anxious and does not have insomnia.      MEDICAL HISTORY:  Past Medical History:  Diagnosis Date  . Cancer ([place])   . COPD (chronic obstructive pulmonary disease) ([place])   . Hypertension     SURGICAL HISTORY: Past Surgical History:  Procedure Laterality Date  . CHOLECYSTECTOMY     Around 2001  . COLONOSCOPY WITH PROPOFOL N/A 04/12/2020   Procedure: COLONOSCOPY WITH PROPOFOL;  Surgeon: Robert Bellow, MD;  Location: [facility] ENDOSCOPY;  Service: Endoscopy;  Laterality: N/A;  . COLONOSCOPY WITH PROPOFOL N/A 04/24/2020   Procedure: COLONOSCOPY WITH PROPOFOL;  Surgeon: Robert Bellow, MD;  Location: [facility] ENDOSCOPY;  Service: Endoscopy;  Laterality: N/A;  . ESOPHAGOGASTRODUODENOSCOPY (EGD) WITH PROPOFOL N/A 04/12/2020   Procedure: ESOPHAGOGASTRODUODENOSCOPY (EGD) WITH PROPOFOL;  Surgeon: Robert Bellow, MD;  Location: [facility] ENDOSCOPY;  Service: Endoscopy;  Laterality: N/A;  . EYE SURGERY Bilateral    cataract  . LAPAROSCOPIC PARTIAL COLECTOMY Right 05/20/2020   Procedure: LAPAROSCOPIC PARTIAL COLECTOMY;  Surgeon: Robert Bellow, MD;  Location: [facility] ORS;  Service: General;  Laterality: Right;  Floyce Stakes, RNFA to assist  . PORTACATH PLACEMENT Left 06/19/2020   Procedure: INSERTION PORT-A-CATH;  Surgeon: Robert Bellow, MD;  Location: [facility] ORS;  Service: General;  Laterality: Left;    SOCIAL HISTORY: Social History   Socioeconomic History  . Marital status: Married    Spouse name: Not on file  . Number of children: Not on file  . Years of education: Not on file  . Highest education level: Not on file  Occupational History  . Not on file  Tobacco Use  . Smoking status: Former Smoker     Years: 3.00    Types: Cigarettes    Quit date: 11/23/1968    Years since quitting: 51.9  . Smokeless tobacco: Never Used  Vaping Use  . Vaping Use: Never used  Substance and Sexual Activity  . Alcohol use: Not Currently  . Drug use: Never  . Sexual activity: Not on file  Other Topics Concern  . Not on file  Social History Narrative   Lives in [place]; smoked- quit in 70s; beer social; 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; grandson    Social Determinants of Health   Financial Resource Strain:   . Difficulty of Paying Living Expenses: Not on file  Food Insecurity:   . Worried About Charity fundraiser in the Last Year: Not on file  . Ran Out of Food in the Last Year: Not on file  Transportation Needs:   . Lack of Transportation (Medical): Not on file  . Lack of Transportation (Non-Medical): Not on file  Physical Activity:   . Days of Exercise per Week: Not on file  . Minutes of Exercise per Session: Not on file  Stress:   . Feeling of Stress : Not on file  Social Connections:   . Frequency of Communication with Friends and Family: Not on file  . Frequency of Social Gatherings with Friends and Family: Not on file  . Attends Religious Services: Not on file  . Active Member of Clubs or Organizations: Not on file  . Attends Club  or Organization Meetings: Not on file  . Marital Status: Not on file  Intimate Partner Violence:   . Fear of Current or Ex-Partner: Not on file  . Emotionally Abused: Not on file  . Physically Abused: Not on file  . Sexually Abused: Not on file    FAMILY HISTORY: Family History  Problem Relation Age of Onset  . Multiple sclerosis Brother     ALLERGIES:  has No Known Allergies.  MEDICATIONS:  Current Outpatient Medications  Medication Sig Dispense Refill  . BREO ELLIPTA 100-25 MCG/INH AEPB Inhale 1 puff into the lungs at bedtime.    . lidocaine-prilocaine (EMLA) cream Apply 1 application topically as needed. 30 g 3  . lisinopril (ZESTRIL) 10 MG  tablet     . ondansetron (ZOFRAN) 8 MG tablet One pill every 8 hours as needed for nausea/vomitting. 40 tablet 1  . potassium chloride SA (KLOR-CON M20) 20 MEQ tablet TAKE 1 TABLET BY MOUTH TWICE A DAY 60 tablet 3  . predniSONE (DELTASONE) 20 MG tablet Take 3 pills once a day x 3 days; 2 pills/day for 3 days; and the once a day. Take with Breakfast. 30 tablet 0  . prochlorperazine (COMPAZINE) 10 MG tablet Take 1 tablet (10 mg total) by mouth every 6 (six) hours as needed for nausea or vomiting. 40 tablet 1  . tiotropium (SPIRIVA) 18 MCG inhalation capsule Place 18 mcg into inhaler and inhale at bedtime.      No current facility-administered medications for this visit.   Facility-Administered Medications Ordered in Other Visits  Medication Dose Route Frequency Provider Last Rate Last Admin  . heparin lock flush 100 unit/mL  500 Units Intravenous Once Charlaine Dalton R, MD          .  PHYSICAL EXAMINATION: ECOG PERFORMANCE STATUS: 1 - Symptomatic but completely ambulatory  Vitals:   10/28/20 0850  BP: 108/69  Pulse: (!) 57  Resp: 16  Temp: (!) 97.1 F (36.2 C)  SpO2: 100%   Filed Weights   10/28/20 0850  Weight: 228 lb 6.4 oz (103.6 kg)    Physical Exam Constitutional:      Comments: Elderly male patient.  Patient is in a wheelchair.  Accompanied by his son.  HENT:     Head: Normocephalic and atraumatic.     Mouth/Throat:     Pharynx: No oropharyngeal exudate.  Eyes:     Pupils: Pupils are equal, round, and reactive to light.  Cardiovascular:     Rate and Rhythm: Normal rate and regular rhythm.  Pulmonary:     Effort: No respiratory distress.     Breath sounds: No wheezing.     Comments: Decreased air entry bilaterally.  No wheeze or crackles Abdominal:     General: Bowel sounds are normal. There is no distension.     Palpations: Abdomen is soft. There is no mass.     Tenderness: There is no abdominal tenderness. There is no guarding or rebound.   Musculoskeletal:        General: No tenderness. Normal range of motion.     Cervical back: Normal range of motion and neck supple.  Skin:    General: Skin is warm.  Neurological:     Mental Status: He is alert and oriented to person, place, and time.  Psychiatric:        Mood and Affect: Affect normal.      LABORATORY DATA:  I have reviewed the data as listed Lab Results  Component Value  Date   WBC 10.4 10/28/2020   HGB 13.6 10/28/2020   HCT 41.1 10/28/2020   MCV 96.7 10/28/2020   PLT 295 10/28/2020   Recent Labs    07/23/20 0829 07/23/20 0829 08/06/20 0852 08/06/20 0852 08/20/20 0821 09/03/20 1033 10/01/20 0946 10/14/20 0851 10/28/20 0837  NA 141   < > 139   < > 138   < > 142 140 139  K 3.5   < > 4.8   < > 4.2   < > 3.8 3.9 4.2  CL 109   < > 104   < > 107   < > 108 105 104  CO2 23   < > 28   < > 25   < > _0 GLUCOSE 137*   < > 103*   < > 101*   < > 109* 105* 101*  BUN 12   < > 14   < > 14   < > _1 CREATININE 0.81   < > 0.84   < > 0.92   < > 1.02 0.85 0.89  CALCIUM 8.2*   < > 8.8*   < > 8.8*   < > 8.9 9.1 9.0  GFRNONAA >60   < > >60   < > >60   < > >60 >60 >60  GFRAA >60  --  >60  --  >60  --   --   --   --   PROT 6.4*   < > 7.2   < > 6.7   < > 6.5 6.9 6.7  ALBUMIN 2.9*   < > 3.3*   < > 3.4*   < > 3.5 3.6 3.7  AST 19   < > 39   < > 26   < > _2 ALT 22   < > 35   < > 22   < > _3 ALKPHOS 64   < > 95   < > 59   < > 51 47 53  BILITOT 1.0   < > 1.0   < > 0.9   < > 1.2 1.5* 1.5*   < > = values in this interval not displayed.    RADIOGRAPHIC STUDIES: I have personally reviewed the radiological images as listed and agreed with the findings in the report. CT CHEST ABDOMEN PELVIS W CONTRAST  Result Date: 10/21/2020 CLINICAL DATA:  Colorectal cancer, assess treatment response. EXAM: CT CHEST, ABDOMEN, AND PELVIS WITH CONTRAST TECHNIQUE: Multidetector CT imaging of the chest, abdomen and pelvis was performed following the standard protocol  during bolus administration of intravenous contrast. CONTRAST:  154m OMNIPAQUE IOHEXOL 300 MG/ML  SOLN COMPARISON:  PET 06/19/2020 and CT abdomen pelvis 05/06/2020. FINDINGS: CT CHEST FINDINGS Cardiovascular: Left IJ Port-A-Cath terminates in upper SVC. Atherosclerotic calcification of the aorta, aortic valve and coronary arteries. Pulmonic trunk and heart are enlarged. No pericardial effusion. Mediastinum/Nodes: No pathologically enlarged mediastinal, hilar or axillary lymph nodes. Esophagus is unremarkable. Lungs/Pleura: Mid and lower lung zone predominant peripheral ground-glass and mild architectural distortion, new from 06/19/2020. No pleural fluid. Airway is unremarkable. Musculoskeletal: Degenerative changes in the spine. No worrisome lytic or sclerotic lesions. CT ABDOMEN PELVIS FINDINGS Hepatobiliary: Liver is unremarkable. Cholecystectomy. No biliary ductal dilatation. Pancreas: Negative. Spleen: Negative. Adrenals/Urinary Tract: Adrenal glands are unremarkable. There may be a punctate stone in the right kidney. Low-attenuation lesions in the kidneys measure up to 2.1 cm on the left and are  likely cysts. Ureters are decompressed. Prostate indents the bladder. Stomach/Bowel: Stomach and majority of the small bowel are unremarkable. Small bowel in the left lower quadrant is somewhat lateral in location,without any associated edema or wall thickening. Right hemicolectomy. Colon is otherwise unremarkable. Vascular/Lymphatic: Atherosclerotic calcification of the aorta without aneurysm. Circumaortic left renal vein. No pathologically enlarged lymph nodes. Reproductive: Prostate is enlarged and indents the bladder. Other: No free fluid. Mesenteric haziness is unchanged and nonspecific. Musculoskeletal: L5-S1 degenerative disc disease. No worrisome lytic or sclerotic lesions. IMPRESSION: 1. Right hemicolectomy. No evidence of recurrent or metastatic disease. 2. Mid and lower lung zone predominant peripheral  ground-glass and mild architectural distortion, new from prior exams. 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 Please correlate clinically. 3. Possible punctate stone in the right kidney. 4. Enlarged prostate. 5. Aortic atherosclerosis (ICD10-I70.0). Coronary artery calcification. 6. Enlarged pulmonic trunk, indicative of pulmonary arterial hypertension. Electronically Signed   By: Lorin Picket M.D.   On: 10/21/2020 11:38    ASSESSMENT & PLAN:   Cancer of right colon ([facility]) #Colon cancer stage III-PT4BN2B- high risk. On adjuvant CIV 5FU. STABLE; nov 29th CT- 1. Right hemicolectomy. No evidence of recurrent or metastatic disease; GOO  In lungs [see below] however, CEA rising. Will consider PET scan if PSA continues to rise.   # proceed with CIV 5FU. Labs today reviewed;  acceptable for treatment today.   # Elevated CEA-19.5;no evidence of any progressive disease noted on CT scan; however am still concerned about non-radiologic true progression/recurrence.  Monitor closely for now.  # minimal GOO in Bil Lungs- ? Post COVID -clinically asymptomatic.  Monitor for now.  #  Enlarged prostate [incidental on CT]- check PSA; asymtomatic. Monitor for now.   # Diarrhea- G-1; STABLE;  sec to 5FU; monitor for now.  # COPD- STABLE;  continue inhalers.   # DISPOSITION: # 5FU CIV  pump off d-3; # follow up in 2 weeks- MD; labs- cbc/cmp;CEA; PSA; 5FU CIV only; D-3 pump off;Dr.B  # I reviewed the blood work- with the patient in detail; also reviewed the imaging independently [as summarized above]; and with the patient in detail.     All questions were answered. The patient knows to call the clinic with any problems, questions or concerns.    Cammie Sickle, MD 10/28/2020 9:47 AM

## 2020-10-28 NOTE — Progress Notes (Signed)
Pt received IV dex infusion and 5FU pump inititated. Ambulatory at discharge.

## 2020-10-28 NOTE — Assessment & Plan Note (Addendum)
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 On adjuvant CIV 5FU. STABLE; nov 29th CT- 1. Right hemicolectomy. No evidence of recurrent or metastatic disease; GOO  In lungs [see below] however, CEA rising. Will consider PET scan if CEA continues to rise.   # proceed with CIV 5FU. Labs today reviewed;  acceptable for treatment today.   # Elevated CEA-19.5;no evidence of any progressive disease noted on CT scan; however am still concerned about non-radiologic true progression/recurrence.  Monitor closely for now.  # minimal GOO in Bil Lungs- ? Post COVID -clinically asymptomatic.  Monitor for now.  #  Enlarged prostate [incidental on CT]- check PSA; asymtomatic. Monitor for now.   # Diarrhea- G-1; STABLE;  sec to 5FU; monitor for now.  # COPD- STABLE;  continue inhalers.   # DISPOSITION: # 5FU CIV  pump off d-3; # follow up in 2 weeks- MD; labs- cbc/cmp;CEA; PSA; 5FU CIV only; D-3 pump off;Dr.B  # I reviewed the blood work- with the patient in detail; also reviewed the imaging independently [as summarized above]; and with the patient in detail.

## 2020-10-29 LAB — CEA: CEA: 17.9 ng/mL — ABNORMAL HIGH (ref 0.0–4.7)

## 2020-10-30 ENCOUNTER — Inpatient Hospital Stay: Payer: Medicare HMO

## 2020-10-30 DIAGNOSIS — C182 Malignant neoplasm of ascending colon: Secondary | ICD-10-CM

## 2020-10-30 DIAGNOSIS — Z5111 Encounter for antineoplastic chemotherapy: Secondary | ICD-10-CM | POA: Diagnosis not present

## 2020-10-30 DIAGNOSIS — Z7189 Other specified counseling: Secondary | ICD-10-CM

## 2020-10-30 MED ORDER — HEPARIN SOD (PORK) LOCK FLUSH 100 UNIT/ML IV SOLN
500.0000 [IU] | Freq: Once | INTRAVENOUS | Status: AC | PRN
  Administered 2020-10-30: 500 [IU]
  Filled 2020-10-30: qty 5

## 2020-10-30 MED ORDER — SODIUM CHLORIDE 0.9% FLUSH
10.0000 mL | INTRAVENOUS | Status: DC | PRN
  Administered 2020-10-30: 10 mL
  Filled 2020-10-30: qty 10

## 2020-11-11 ENCOUNTER — Inpatient Hospital Stay: Payer: Medicare HMO

## 2020-11-11 ENCOUNTER — Encounter: Payer: Self-pay | Admitting: Internal Medicine

## 2020-11-11 ENCOUNTER — Inpatient Hospital Stay (HOSPITAL_BASED_OUTPATIENT_CLINIC_OR_DEPARTMENT_OTHER): Payer: Medicare HMO | Admitting: Internal Medicine

## 2020-11-11 ENCOUNTER — Other Ambulatory Visit: Payer: Self-pay

## 2020-11-11 VITALS — BP 122/67 | HR 53 | Temp 97.5°F | Resp 16 | Ht 75.0 in | Wt 233.0 lb

## 2020-11-11 DIAGNOSIS — C182 Malignant neoplasm of ascending colon: Secondary | ICD-10-CM | POA: Diagnosis not present

## 2020-11-11 DIAGNOSIS — Z7189 Other specified counseling: Secondary | ICD-10-CM

## 2020-11-11 DIAGNOSIS — Z5111 Encounter for antineoplastic chemotherapy: Secondary | ICD-10-CM | POA: Diagnosis not present

## 2020-11-11 LAB — CBC WITH DIFFERENTIAL/PLATELET
Abs Immature Granulocytes: 0.04 10*3/uL (ref 0.00–0.07)
Basophils Absolute: 0.1 10*3/uL (ref 0.0–0.1)
Basophils Relative: 1 %
Eosinophils Absolute: 0.4 10*3/uL (ref 0.0–0.5)
Eosinophils Relative: 3 %
HCT: 39.4 % (ref 39.0–52.0)
Hemoglobin: 13.5 g/dL (ref 13.0–17.0)
Immature Granulocytes: 0 %
Lymphocytes Relative: 21 %
Lymphs Abs: 2.3 10*3/uL (ref 0.7–4.0)
MCH: 32.8 pg (ref 26.0–34.0)
MCHC: 34.3 g/dL (ref 30.0–36.0)
MCV: 95.6 fL (ref 80.0–100.0)
Monocytes Absolute: 0.9 10*3/uL (ref 0.1–1.0)
Monocytes Relative: 8 %
Neutro Abs: 7.2 10*3/uL (ref 1.7–7.7)
Neutrophils Relative %: 67 %
Platelets: 211 10*3/uL (ref 150–400)
RBC: 4.12 MIL/uL — ABNORMAL LOW (ref 4.22–5.81)
RDW: 15.5 % (ref 11.5–15.5)
WBC: 10.8 10*3/uL — ABNORMAL HIGH (ref 4.0–10.5)
nRBC: 0 % (ref 0.0–0.2)

## 2020-11-11 LAB — COMPREHENSIVE METABOLIC PANEL
ALT: 16 U/L (ref 0–44)
AST: 19 U/L (ref 15–41)
Albumin: 3.6 g/dL (ref 3.5–5.0)
Alkaline Phosphatase: 46 U/L (ref 38–126)
Anion gap: 7 (ref 5–15)
BUN: 15 mg/dL (ref 8–23)
CO2: 28 mmol/L (ref 22–32)
Calcium: 9 mg/dL (ref 8.9–10.3)
Chloride: 105 mmol/L (ref 98–111)
Creatinine, Ser: 0.87 mg/dL (ref 0.61–1.24)
GFR, Estimated: >60 mL/min (ref >60)
Glucose, Bld: 88 mg/dL (ref 70–99)
Potassium: 3.6 mmol/L (ref 3.5–5.1)
Sodium: 140 mmol/L (ref 135–145)
Total Bilirubin: 1.8 mg/dL — ABNORMAL HIGH (ref 0.3–1.2)
Total Protein: 6.6 g/dL (ref 6.5–8.1)

## 2020-11-11 LAB — PSA: Prostatic Specific Antigen: 2.61 ng/mL (ref 0.00–4.00)

## 2020-11-11 MED ORDER — HEPARIN SOD (PORK) LOCK FLUSH 100 UNIT/ML IV SOLN
500.0000 [IU] | Freq: Once | INTRAVENOUS | Status: DC
  Filled 2020-11-11: qty 5

## 2020-11-11 MED ORDER — SODIUM CHLORIDE 0.9 % IV SOLN
Freq: Once | INTRAVENOUS | Status: AC
  Filled 2020-11-11: qty 250

## 2020-11-11 MED ORDER — SODIUM CHLORIDE 0.9% FLUSH
10.0000 mL | INTRAVENOUS | Status: DC | PRN
  Administered 2020-11-11: 11:00:00 10 mL
  Filled 2020-11-11: qty 10

## 2020-11-11 MED ORDER — SODIUM CHLORIDE 0.9% FLUSH
10.0000 mL | Freq: Once | INTRAVENOUS | Status: AC
  Administered 2020-11-11: 09:00:00 10 mL via INTRAVENOUS
  Filled 2020-11-11: qty 10

## 2020-11-11 MED ORDER — SODIUM CHLORIDE 0.9 % IV SOLN
10.0000 mg | Freq: Once | INTRAVENOUS | Status: AC
  Administered 2020-11-11: 10:00:00 10 mg via INTRAVENOUS
  Filled 2020-11-11: qty 10

## 2020-11-11 MED ORDER — SODIUM CHLORIDE 0.9 % IV SOLN
2400.0000 mg/m2 | INTRAVENOUS | Status: DC
  Administered 2020-11-11: 11:00:00 5400 mg via INTRAVENOUS
  Filled 2020-11-11: qty 108

## 2020-11-11 NOTE — Progress Notes (Signed)
0935- Total Bilirubin: 1.8. MD, Dr. Rogue Bussing, notified and aware. Per MD order: proceed with scheduled Fluorouracil treatment today.  1040- Patient tolerated treatment well. Patient stable and discharged to home with Fluorouracil Continuous Infusion Pump in place.

## 2020-11-11 NOTE — Progress Notes (Signed)
[place] NOTE  Patient Care Team: Albina Billet, MD as PCP - General (Internal Medicine) Clent Jacks, RN as Oncology Nurse Navigator Cammie Sickle, MD as Consulting Physician (Hematology and Oncology)  CHIEF COMPLAINTS/PURPOSE OF CONSULTATION: Colon cancer  #  Oncology History Overview Note  # July 2021- RIGHT COLON ADENO CARCINOMA- Dr.Byrnett s/p right hemicolectomy- pT4b N2b- STAGE III [high risk]; [facility]SI-STABLE.   # AUG 10th, 2021- FOLFOX [adjuvant]  -------------------------------------------------------------------------------------------   - INVASIVE MODERATELY DIFFERENTIATED ADENOCARCINOMA.  - TEN OF FIFTY-TWO LYMPH NODES POSITIVE FOR METASTATIC ADENOCARCINOMA  (10/52).  - SINGLE INCIDENTAL TUBULOVILLOUS ADENOMA.  - THREE INCIDENTAL TUBULAR ADENOMAS.  - SEE CANCER SUMMARY.   CANCER CASE SUMMARY: COLON AND RECTUM  Procedure: Right hemicolectomy  Tumor Site: Cecum  Tumor Size: Greatest dimension: 12.2 cm  Macroscopic Tumor Perforation: Present  Histologic Type: Adenocarcinoma  Histologic Grade: G2: Moderately differentiated  Tumor Extension: Tumor directly invades adjacent structures (terminal  ileum)  Margins: All margins are uninvolved by invasive carcinoma, high-grade  dysplasia/intramucosal adenocarcinoma, and low-grade dysplasia  Treatment Effect: No known presurgical therapy  Lymphovascular Invasion: Present  Perineural Invasion: Present  Tumor Deposits: Present: Multiple  Regional Lymph Nodes:       Number of Lymph Nodes Involved: 10       Number of Lymph Nodes Examined: 59  -  # COPD- [Dr.Fleming];   # SURVIVORSHIP:   # GENETICS:   DIAGNOSIS: colon cancer  STAGE:   III      ;  GOALS:cure  CURRENT/MOST RECENT THERAPY : FOLFOX     Cancer of right colon ([place])  05/20/2020 Initial Diagnosis   Cancer of right colon ([place])   07/02/2020 -  Chemotherapy   The patient had dexamethasone (DECADRON) 4 MG tablet, 8 mg,  Oral, Daily, 1 of 1 cycle, Start date: --, End date: -- palonosetron (ALOXI) injection 0.25 mg, 0.25 mg, Intravenous,  Once, 1 of 1 cycle Administration: 0.25 mg (07/02/2020) leucovorin 904 mg in dextrose 5 % 250 mL infusion, 400 mg/m2 = 904 mg, Intravenous,  Once, 1 of 1 cycle oxaliplatin (ELOXATIN) 190 mg in dextrose 5 % 500 mL chemo infusion, 85 mg/m2 = 190 mg, Intravenous,  Once, 1 of 1 cycle Administration: 190 mg (07/02/2020) fluorouracil (ADRUCIL) 5,400 mg in sodium chloride 0.9 % 142 mL chemo infusion, 2,400 mg/m2 = 5,400 mg, Intravenous, 1 Day/Dose, 10 of 12 cycles Administration: 5,400 mg (07/02/2020), 5,400 mg (07/23/2020), 5,400 mg (08/06/2020), 5,400 mg (08/20/2020), 5,400 mg (09/03/2020), 5,400 mg (09/17/2020), 5,400 mg (10/01/2020), 5,400 mg (10/14/2020), 5,400 mg (10/28/2020)  for chemotherapy treatment.       HISTORY OF PRESENTING ILLNESS:  James Hodge 74 y.o.  male stage III colon cancer-high risk currently on adjuvant continuous 5-FU infusion is here for follow-up.  Patient denies any nausea vomiting abdominal pain.  Denies any new onset of shortness of breath or cough.  Continues to have mild diarrhea 1-2 stools a day.  Review of Systems  Constitutional: Positive for malaise/fatigue. Negative for chills, diaphoresis and fever.  HENT: Negative for nosebleeds and sore throat.   Eyes: Negative for double vision.  Respiratory: Positive for cough and shortness of breath. Negative for hemoptysis, sputum production and wheezing.   Cardiovascular: Negative for chest pain, palpitations, orthopnea and leg swelling.  Gastrointestinal: Negative for abdominal pain, blood in stool, constipation, diarrhea, heartburn, melena, nausea and vomiting.  Genitourinary: Negative for dysuria, frequency and urgency.  Musculoskeletal: Negative for back pain and joint pain.  Skin:  Negative.  Negative for itching and rash.  Neurological: Negative for dizziness, focal weakness, weakness and headaches.   Endo/Heme/Allergies: Does not bruise/bleed easily.  Psychiatric/Behavioral: Negative for depression. The patient is not nervous/anxious and does not have insomnia.      MEDICAL HISTORY:  Past Medical History:  Diagnosis Date  . Cancer ([place])   . COPD (chronic obstructive pulmonary disease) ([place])   . Hypertension     SURGICAL HISTORY: Past Surgical History:  Procedure Laterality Date  . CHOLECYSTECTOMY     Around 2001  . COLONOSCOPY WITH PROPOFOL N/A 04/12/2020   Procedure: COLONOSCOPY WITH PROPOFOL;  Surgeon: Robert Bellow, MD;  Location: [facility];  Service: Endoscopy;  Laterality: N/A;  . COLONOSCOPY WITH PROPOFOL N/A 04/24/2020   Procedure: COLONOSCOPY WITH PROPOFOL;  Surgeon: Robert Bellow, MD;  Location: [facility] END[facility]COPY;  Service: Endoscopy;  Laterality: N/A;  . ESOPHAGOGASTRODUODENOSCOPY (EGD) WITH PROPOFOL N/A 04/12/2020   Procedure: ESOPHAGOGASTRODUODENOSCOPY (EGD) WITH PROPOFOL;  Surgeon: Robert Bellow, MD;  Location: [facility] ENDOSCOPY;  Service: Endoscopy;  Laterality: N/A;  . EYE SURGERY Bilateral    cataract  . LAPAROSCOPIC PARTIAL COLECTOMY Right 05/20/2020   Procedure: LAPAROSCOPIC PARTIAL COLECTOMY;  Surgeon: Robert Bellow, MD;  Location: [facility] ORS;  Service: General;  Laterality: Right;  Floyce Stakes, RNFA to assist  . PORTACATH PLACEMENT Left 06/19/2020   Procedure: INSERTION PORT-A-CATH;  Surgeon: Robert Bellow, MD;  Location: [facility] ORS;  Service: General;  Laterality: Left;    SOCIAL HISTORY: Social History   Socioeconomic History  . Marital status: Married    Spouse name: Not on file  . Number of children: Not on file  . Years of education: Not on file  . Highest education level: Not on file  Occupational History  . Not on file  Tobacco Use  . Smoking status: Former Smoker    Years: 3.00    Types: Cigarettes    Quit date: 11/23/1968    Years since quitting: 52.0  . Smokeless tobacco: Never Used  Vaping Use  . Vaping Use: Never  used  Substance and Sexual Activity  . Alcohol use: Not Currently  . Drug use: Never  . Sexual activity: Not on file  Other Topics Concern  . Not on file  Social History Narrative   Lives in [place]; smoked- quit in 70s; beer social; 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; grandson    Social Determinants of Health   Financial Resource Strain: Not on file  Food Insecurity: Not on file  Transportation Needs: Not on file  Physical Activity: Not on file  Stress: Not on file  Social Connections: Not on file  Intimate Partner Violence: Not on file    FAMILY HISTORY: Family History  Problem Relation Age of Onset  . Multiple sclerosis Brother     ALLERGIES:  has No Known Allergies.  MEDICATIONS:  Current Outpatient Medications  Medication Sig Dispense Refill  . BREO ELLIPTA 100-25 MCG/INH AEPB Inhale 1 puff into the lungs at bedtime.    . lidocaine-prilocaine (EMLA) cream Apply 1 application topically as needed. 30 g 3  . lisinopril (ZESTRIL) 10 MG tablet     . ondansetron (ZOFRAN) 8 MG tablet One pill every 8 hours as needed for nausea/vomitting. 40 tablet 1  . potassium chloride SA (KLOR-CON M20) 20 MEQ tablet TAKE 1 TABLET BY MOUTH TWICE A DAY 60 tablet 3  . predniSONE (DELTASONE) 20 MG tablet Take 3 pills once a day x 3 days; 2 pills/day for 3  days; and the once a day. Take with Breakfast. 30 tablet 0  . prochlorperazine (COMPAZINE) 10 MG tablet Take 1 tablet (10 mg total) by mouth every 6 (six) hours as needed for nausea or vomiting. 40 tablet 1  . tiotropium (SPIRIVA) 18 MCG inhalation capsule Place 18 mcg into inhaler and inhale at bedtime.      No current facility-administered medications for this visit.   Facility-Administered Medications Ordered in Other Visits  Medication Dose Route Frequency Provider Last Rate Last Admin  . dexamethasone (DECADRON) 10 mg in sodium chloride 0.9 % 50 mL IVPB  10 mg Intravenous Once Cammie Sickle, MD 204 mL/hr at 11/11/20 0955 10 mg at  11/11/20 0955  . fluorouracil (ADRUCIL) 5,400 mg in sodium chloride 0.9 % 142 mL chemo infusion  2,400 mg/m2 (Treatment Plan Recorded) Intravenous 1 day or 1 dose Charlaine Dalton R, MD      . heparin lock flush 100 unit/mL  500 Units Intravenous Once Charlaine Dalton R, MD      . heparin lock flush 100 unit/mL  500 Units Intravenous Once Charlaine Dalton R, MD      . sodium chloride flush (NS) 0.9 % injection 10 mL  10 mL Intracatheter PRN Cammie Sickle, MD          .  PHYSICAL EXAMINATION: ECOG PERFORMANCE STATUS: 1 - Symptomatic but completely ambulatory  Vitals:   11/11/20 0858  BP: 122/67  Pulse: (!) 53  Resp: 16  Temp: (!) 97.5 F (36.4 C)  SpO2: 99%   Filed Weights   11/11/20 0858  Weight: 233 lb (105.7 kg)    Physical Exam Constitutional:      Comments: Elderly male patient.  Patient is in a wheelchair.  Accompanied by his son.  HENT:     Head: Normocephalic and atraumatic.     Mouth/Throat:     Pharynx: No oropharyngeal exudate.  Eyes:     Pupils: Pupils are equal, round, and reactive to light.  Cardiovascular:     Rate and Rhythm: Normal rate and regular rhythm.  Pulmonary:     Effort: No respiratory distress.     Breath sounds: No wheezing.     Comments: Decreased air entry bilaterally.  No wheeze or crackles Abdominal:     General: Bowel sounds are normal. There is no distension.     Palpations: Abdomen is soft. There is no mass.     Tenderness: There is no abdominal tenderness. There is no guarding or rebound.  Musculoskeletal:        General: No tenderness. Normal range of motion.     Cervical back: Normal range of motion and neck supple.  Skin:    General: Skin is warm.  Neurological:     Mental Status: He is alert and oriented to person, place, and time.  Psychiatric:        Mood and Affect: Affect normal.      LABORATORY DATA:  I have reviewed the data as listed Lab Results  Component Value Date   WBC 10.8 (H) 11/11/2020    HGB 13.5 11/11/2020   HCT 39.4 11/11/2020   MCV 95.6 11/11/2020   PLT 211 11/11/2020   Recent Labs    07/23/20 0829 08/06/20 0852 08/20/20 0821 09/03/20 1033 10/14/20 0851 10/28/20 0837 11/11/20 0848  NA 141 139 138   < > 140 139 140  K 3.5 4.8 4.2   < > 3.9 4.2 3.6  CL 109 104 107   < >  105 104 105  CO2 '23 28 25   ' < > '28 28 28  ' GLUCOSE 137* 103* 101*   < > 105* 101* 88  BUN '12 14 14   ' < > '14 11 15  ' CREATININE 0.81 0.84 0.92   < > 0.85 0.89 0.87  CALCIUM 8.2* 8.8* 8.8*   < > 9.1 9.0 9.0  GFRNONAA >60 >60 >60   < > >60 >60 >60  GFRAA >60 >60 >60  --   --   --   --   PROT 6.4* 7.2 6.7   < > 6.9 6.7 6.6  ALBUMIN 2.9* 3.3* 3.4*   < > 3.6 3.7 3.6  AST 19 39 26   < > '20 18 19  ' ALT 22 35 22   < > '16 16 16  ' ALKPHOS 64 95 59   < > 47 53 46  BILITOT 1.0 1.0 0.9   < > 1.5* 1.5* 1.8*   < > = values in this interval not displayed.    RADIOGRAPHIC STUDIES: I have personally reviewed the radiological images as listed and agreed with the findings in the report. CT CHEST ABDOMEN PELVIS W CONTRAST  Result Date: 10/21/2020 CLINICAL DATA:  Colorectal cancer, assess treatment response. EXAM: CT CHEST, ABDOMEN, AND PELVIS WITH CONTRAST TECHNIQUE: Multidetector CT imaging of the chest, abdomen and pelvis was performed following the standard protocol during bolus administration of intravenous contrast. CONTRAST:  168m OMNIPAQUE IOHEXOL 300 MG/ML  SOLN COMPARISON:  PET 06/19/2020 and CT abdomen pelvis 05/06/2020. FINDINGS: CT CHEST FINDINGS Cardiovascular: Left IJ Port-A-Cath terminates in upper SVC. Atherosclerotic calcification of the aorta, aortic valve and coronary arteries. Pulmonic trunk and heart are enlarged. No pericardial effusion. Mediastinum/Nodes: No pathologically enlarged mediastinal, hilar or axillary lymph nodes. Esophagus is unremarkable. Lungs/Pleura: Mid and lower lung zone predominant peripheral ground-glass and mild architectural distortion, new from 06/19/2020. No pleural  fluid. Airway is unremarkable. Musculoskeletal: Degenerative changes in the spine. No worrisome lytic or sclerotic lesions. CT ABDOMEN PELVIS FINDINGS Hepatobiliary: Liver is unremarkable. Cholecystectomy. No biliary ductal dilatation. Pancreas: Negative. Spleen: Negative. Adrenals/Urinary Tract: Adrenal glands are unremarkable. There may be a punctate stone in the right kidney. Low-attenuation lesions in the kidneys measure up to 2.1 cm on the left and are likely cysts. Ureters are decompressed. Prostate indents the bladder. Stomach/Bowel: Stomach and majority of the small bowel are unremarkable. Small bowel in the left lower quadrant is somewhat lateral in location,without any associated edema or wall thickening. Right hemicolectomy. Colon is otherwise unremarkable. Vascular/Lymphatic: Atherosclerotic calcification of the aorta without aneurysm. Circumaortic left renal vein. No pathologically enlarged lymph nodes. Reproductive: Prostate is enlarged and indents the bladder. Other: No free fluid. Mesenteric haziness is unchanged and nonspecific. Musculoskeletal: L5-S1 degenerative disc disease. No worrisome lytic or sclerotic lesions. IMPRESSION: 1. Right hemicolectomy. No evidence of recurrent or metastatic disease. 2. Mid and lower lung zone predominant peripheral ground-glass and mild architectural distortion, new from prior exams. 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 Please correlate clinically. 3. Possible punctate stone in the right kidney. 4. Enlarged prostate. 5. Aortic atherosclerosis (ICD10-I70.0). Coronary artery calcification. 6. Enlarged pulmonic trunk, indicative of pulmonary arterial hypertension. Electronically Signed   By: MLorin PicketM.D.   On: 10/21/2020 11:38    ASSESSMENT & PLAN:   Cancer of right colon ([facility]) #Colon cancer stage III-PT4BN2B- high risk. On adjuvant CIV 5FU. STABLE; nov 29th CT- Right hemicolectomy. No evidence of recurrent or metastatic  disease; GOO  In lungs [see below] however, CEA rising. Will consider PET scan if CEA continues to rise.   # proceed with CIV 5FU # 10/of planned # 12 today. Labs today reviewed;  acceptable for treatment today.   # Elevated CEA-17-;no evidence of any progressive disease noted on CT scan; however am still concerned about non-radiologic true progression/recurrence. Discussed with patient. Monitor closely for now; again if worse- would recommend PET scan.   # minimal GOO in Bil Lungs- ? Post COVID -clinically asymptomatic.  Monitor for now.  #  Enlarged prostate [incidental on CT]- check PSA; asymtomatic. Monitor for now.   # Diarrhea- G-1; STABLE;  sec to 5FU; monitor for now.  # COPD- STABLE;   continue inhalers.   # DISPOSITION: # 5FU CIV  pump off d-3; # follow up in 2 weeks-X- MD; labs- cbc/cmp;CEA; PSA; 5FU CIV only; D-3 pump off;Dr.B    All questions were answered. The patient knows to call the clinic with any problems, questions or concerns.    Cammie Sickle, MD 11/11/2020 10:00 AM

## 2020-11-11 NOTE — Assessment & Plan Note (Addendum)
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 On adjuvant CIV 5FU. STABLE; nov 29th CT- Right hemicolectomy. No evidence of recurrent or metastatic disease; GOO  In lungs [see below] however, CEA rising. Will consider PET scan if CEA continues to rise.   # proceed with CIV 5FU # 10/of planned # 12 today. Labs today reviewed;  acceptable for treatment today.   # Elevated CEA-17-;no evidence of any progressive disease noted on CT scan; however am still concerned about non-radiologic true progression/recurrence. Discussed with patient. Monitor closely for now; again if worse- would recommend PET scan.   # minimal GOO in Bil Lungs- ? Post COVID -clinically asymptomatic.  Monitor for now.  #  Enlarged prostate [incidental on CT]- check PSA; asymtomatic. Monitor for now.   # Diarrhea- G-1; STABLE;  sec to 5FU; monitor for now.  # COPD- STABLE;   continue inhalers.   # DISPOSITION: # 5FU CIV  pump off d-3; # follow up in 2 weeks-X- MD; labs- cbc/cmp;CEA; PSA; 5FU CIV only; D-3 pump off;Dr.B

## 2020-11-12 LAB — CEA: CEA: 18 ng/mL — ABNORMAL HIGH (ref 0.0–4.7)

## 2020-11-13 ENCOUNTER — Inpatient Hospital Stay: Payer: Medicare HMO

## 2020-11-13 ENCOUNTER — Other Ambulatory Visit: Payer: Self-pay

## 2020-11-13 DIAGNOSIS — Z5111 Encounter for antineoplastic chemotherapy: Secondary | ICD-10-CM | POA: Diagnosis not present

## 2020-11-13 DIAGNOSIS — C182 Malignant neoplasm of ascending colon: Secondary | ICD-10-CM

## 2020-11-13 DIAGNOSIS — Z7189 Other specified counseling: Secondary | ICD-10-CM

## 2020-11-13 MED ORDER — SODIUM CHLORIDE 0.9% FLUSH
10.0000 mL | INTRAVENOUS | Status: DC | PRN
  Administered 2020-11-13: 13:00:00 10 mL
  Filled 2020-11-13: qty 10

## 2020-11-13 MED ORDER — HEPARIN SOD (PORK) LOCK FLUSH 100 UNIT/ML IV SOLN
500.0000 [IU] | Freq: Once | INTRAVENOUS | Status: AC | PRN
  Administered 2020-11-13: 13:00:00 500 [IU]
  Filled 2020-11-13: qty 5

## 2020-11-25 ENCOUNTER — Inpatient Hospital Stay: Payer: Medicare HMO | Attending: Internal Medicine

## 2020-11-25 ENCOUNTER — Other Ambulatory Visit: Payer: Self-pay | Admitting: Oncology

## 2020-11-25 ENCOUNTER — Inpatient Hospital Stay (HOSPITAL_BASED_OUTPATIENT_CLINIC_OR_DEPARTMENT_OTHER): Payer: Medicare HMO | Admitting: Oncology

## 2020-11-25 ENCOUNTER — Encounter: Payer: Self-pay | Admitting: Oncology

## 2020-11-25 ENCOUNTER — Inpatient Hospital Stay: Payer: Medicare HMO

## 2020-11-25 VITALS — BP 147/70 | HR 54 | Temp 98.2°F | Wt 243.3 lb

## 2020-11-25 DIAGNOSIS — E876 Hypokalemia: Secondary | ICD-10-CM | POA: Diagnosis not present

## 2020-11-25 DIAGNOSIS — C182 Malignant neoplasm of ascending colon: Secondary | ICD-10-CM | POA: Insufficient documentation

## 2020-11-25 DIAGNOSIS — R197 Diarrhea, unspecified: Secondary | ICD-10-CM | POA: Diagnosis not present

## 2020-11-25 DIAGNOSIS — J449 Chronic obstructive pulmonary disease, unspecified: Secondary | ICD-10-CM | POA: Insufficient documentation

## 2020-11-25 DIAGNOSIS — Z452 Encounter for adjustment and management of vascular access device: Secondary | ICD-10-CM | POA: Diagnosis not present

## 2020-11-25 DIAGNOSIS — N400 Enlarged prostate without lower urinary tract symptoms: Secondary | ICD-10-CM | POA: Diagnosis not present

## 2020-11-25 DIAGNOSIS — Z7189 Other specified counseling: Secondary | ICD-10-CM

## 2020-11-25 DIAGNOSIS — Z5111 Encounter for antineoplastic chemotherapy: Secondary | ICD-10-CM | POA: Diagnosis not present

## 2020-11-25 DIAGNOSIS — R970 Elevated carcinoembryonic antigen [CEA]: Secondary | ICD-10-CM | POA: Insufficient documentation

## 2020-11-25 LAB — COMPREHENSIVE METABOLIC PANEL
ALT: 13 U/L (ref 0–44)
AST: 18 U/L (ref 15–41)
Albumin: 3.3 g/dL — ABNORMAL LOW (ref 3.5–5.0)
Alkaline Phosphatase: 46 U/L (ref 38–126)
Anion gap: 8 (ref 5–15)
BUN: 12 mg/dL (ref 8–23)
CO2: 25 mmol/L (ref 22–32)
Calcium: 8.4 mg/dL — ABNORMAL LOW (ref 8.9–10.3)
Chloride: 108 mmol/L (ref 98–111)
Creatinine, Ser: 0.82 mg/dL (ref 0.61–1.24)
GFR, Estimated: >60 mL/min (ref >60)
Glucose, Bld: 95 mg/dL (ref 70–99)
Potassium: 3.1 mmol/L — ABNORMAL LOW (ref 3.5–5.1)
Sodium: 141 mmol/L (ref 135–145)
Total Bilirubin: 2 mg/dL — ABNORMAL HIGH (ref 0.3–1.2)
Total Protein: 6 g/dL — ABNORMAL LOW (ref 6.5–8.1)

## 2020-11-25 LAB — CBC WITH DIFFERENTIAL/PLATELET
Abs Immature Granulocytes: 0.02 10*3/uL (ref 0.00–0.07)
Basophils Absolute: 0 10*3/uL (ref 0.0–0.1)
Basophils Relative: 0 %
Eosinophils Absolute: 0.3 10*3/uL (ref 0.0–0.5)
Eosinophils Relative: 3 %
HCT: 36.8 % — ABNORMAL LOW (ref 39.0–52.0)
Hemoglobin: 12.3 g/dL — ABNORMAL LOW (ref 13.0–17.0)
Immature Granulocytes: 0 %
Lymphocytes Relative: 19 %
Lymphs Abs: 1.8 10*3/uL (ref 0.7–4.0)
MCH: 32.5 pg (ref 26.0–34.0)
MCHC: 33.4 g/dL (ref 30.0–36.0)
MCV: 97.1 fL (ref 80.0–100.0)
Monocytes Absolute: 0.9 10*3/uL (ref 0.1–1.0)
Monocytes Relative: 10 %
Neutro Abs: 6.3 10*3/uL (ref 1.7–7.7)
Neutrophils Relative %: 68 %
Platelets: 190 10*3/uL (ref 150–400)
RBC: 3.79 MIL/uL — ABNORMAL LOW (ref 4.22–5.81)
RDW: 15.3 % (ref 11.5–15.5)
WBC: 9.3 10*3/uL (ref 4.0–10.5)
nRBC: 0 % (ref 0.0–0.2)

## 2020-11-25 LAB — PSA: Prostatic Specific Antigen: 1.72 ng/mL (ref 0.00–4.00)

## 2020-11-25 MED ORDER — SODIUM CHLORIDE 0.9% FLUSH
10.0000 mL | Freq: Once | INTRAVENOUS | Status: AC
  Administered 2020-11-25: 10 mL via INTRAVENOUS
  Filled 2020-11-25: qty 10

## 2020-11-25 MED ORDER — SODIUM CHLORIDE 0.9 % IV SOLN
10.0000 mg | Freq: Once | INTRAVENOUS | Status: AC
  Administered 2020-11-25: 10 mg via INTRAVENOUS
  Filled 2020-11-25: qty 10

## 2020-11-25 MED ORDER — DEXTROSE 5 % IV SOLN
Freq: Once | INTRAVENOUS | Status: AC
  Filled 2020-11-25: qty 250

## 2020-11-25 MED ORDER — SODIUM CHLORIDE 0.9 % IV SOLN
2400.0000 mg/m2 | INTRAVENOUS | Status: DC
  Administered 2020-11-25: 5400 mg via INTRAVENOUS
  Filled 2020-11-25: qty 108

## 2020-11-25 NOTE — Progress Notes (Signed)
Pt in for follow up, states he has felt good.  Appetite good, no pain today.

## 2020-11-25 NOTE — Progress Notes (Signed)
Bilirubin 2.0 today. Per Dr Orlie Dakin okay to proceed with treatment  1114- Stable at discharge

## 2020-11-25 NOTE — Progress Notes (Signed)
[facility] NOTE  Patient Care Team: Albina Billet, MD as PCP - General (Internal Medicine) Clent Jacks, RN as Oncology Nurse Navigator Cammie Sickle, MD as Consulting Physician (Hematology and Oncology)  CHIEF COMPLAINTS/PURPOSE OF CONSULTATION: Colon cancer  #  Oncology History Overview Note  # July 2021- RIGHT COLON ADENO CARCINOMA- Dr.Byrnett s/p right hemicolectomy- pT4b N2b- STAGE III [high risk]; MSI-STABLE.   # AUG 10th, 2021- FOLFOX [adjuvant]  -------------------------------------------------------------------------------------------   - INVASIVE MODERATELY DIFFERENTIATED ADENOCARCINOMA.  - TEN OF FIFTY-TWO LYMPH NODES POSITIVE FOR METASTATIC ADENOCARCINOMA  (10/52).  - SINGLE INCIDENTAL TUBULOVILLOUS ADENOMA.  - THREE INCIDENTAL TUBULAR ADENOMAS.  - SEE CANCER SUMMARY.   CANCER CASE SUMMARY: COLON AND RECTUM  Procedure: Right hemicolectomy  Tumor Site: Cecum  Tumor Size: Greatest dimension: 12.2 cm  Macroscopic Tumor Perforation: Present  Histologic Type: Adenocarcinoma  Histologic Grade: G2: Moderately differentiated  Tumor Extension: Tumor directly invades adjacent structures (terminal  ileum)  Margins: All margins are uninvolved by invasive carcinoma, high-grade  dysplasia/intramucosal adenocarcinoma, and low-grade dysplasia  Treatment Effect: No known presurgical therapy  Lymphovascular Invasion: Present  Perineural Invasion: Present  Tumor Deposits: Present: Multiple  Regional Lymph Nodes:       Number of Lymph Nodes Involved: 10       Number of Lymph Nodes Examined: 61  -  # COPD- [Dr.Fleming];   # SURVIVORSHIP:   # GENETICS:   DIAGNOSIS: colon cancer  STAGE:   III      ;  GOALS:cure  CURRENT/MOST RECENT THERAPY : FOLFOX     Cancer of right colon ([place])  05/20/2020 Initial Diagnosis   Cancer of right colon ([place])   07/02/2020 -  Chemotherapy   The patient had dexamethasone (DECADRON) 4 MG tablet, 8 mg,  Oral, Daily, 1 of 1 cycle, Start date: --, End date: -- palonosetron (ALOXI) injection 0.25 mg, 0.25 mg, Intravenous,  Once, 1 of 1 cycle Administration: 0.25 mg (07/02/2020) leucovorin 904 mg in dextrose 5 % 250 mL infusion, 400 mg/m2 = 904 mg, Intravenous,  Once, 1 of 1 cycle oxaliplatin (ELOXATIN) 190 mg in dextrose 5 % 500 mL chemo infusion, 85 mg/m2 = 190 mg, Intravenous,  Once, 1 of 1 cycle Administration: 190 mg (07/02/2020) fluorouracil (ADRUCIL) 5,400 mg in sodium chloride 0.9 % 142 mL chemo infusion, 2,400 mg/m2 = 5,400 mg, Intravenous, 1 Day/Dose, 10 of 12 cycles Administration: 5,400 mg (07/02/2020), 5,400 mg (07/23/2020), 5,400 mg (08/06/2020), 5,400 mg (08/20/2020), 5,400 mg (09/03/2020), 5,400 mg (09/17/2020), 5,400 mg (10/01/2020), 5,400 mg (10/14/2020), 5,400 mg (10/28/2020), 5,400 mg (11/11/2020)  for chemotherapy treatment.       HISTORY OF PRESENTING ILLNESS:  James Hodge 75 y.o.  male stage III colon cancer-high risk currently on adjuvant continuous 5-FU infusion is here for follow-up.  He was last seen in clinic on 11/11/2020 prior to cycle 10.  Complained of mild diarrhea 1-2 stools a day.  Today he presents for assessment prior to cycle 11.  Patient states he feels pretty well today.  Denies any new concerns.  Diarrhea is stable continues 1-2 bowel movements daily.  Review of Systems  Constitutional: Positive for malaise/fatigue.  Gastrointestinal: Positive for diarrhea.     MEDICAL HISTORY:  Past Medical History:  Diagnosis Date  . Cancer ([place])   . COPD (chronic obstructive pulmonary disease) ([place])   . Hypertension     SURGICAL HISTORY: Past Surgical History:  Procedure Laterality Date  . CHOLECYSTECTOMY     Around  2001  . COLONOSCOPY WITH PROPOFOL N/A 04/12/2020   Procedure: COLONOSCOPY WITH PROPOFOL;  Surgeon: Robert Bellow, MD;  Location: [facility] ENDOSCOPY;  Service: Endoscopy;  Laterality: N/A;  . COLONOSCOPY WITH PROPOFOL N/A 04/24/2020   Procedure:  COLONOSCOPY WITH PROPOFOL;  Surgeon: Robert Bellow, MD;  Location: [facility] ENDOSCOPY;  Service: Endoscopy;  Laterality: N/A;  . ESOPHAGOGASTRODUODENOSCOPY (EGD) WITH PROPOFOL N/A 04/12/2020   Procedure: ESOPHAGOGASTRODUODENOSCOPY (EGD) WITH PROPOFOL;  Surgeon: Robert Bellow, MD;  Location: [facility] ENDOSCOPY;  Service: Endoscopy;  Laterality: N/A;  . EYE SURGERY Bilateral    cataract  . LAPAROSCOPIC PARTIAL COLECTOMY Right 05/20/2020   Procedure: LAPAROSCOPIC PARTIAL COLECTOMY;  Surgeon: Robert Bellow, MD;  Location: [facility] ORS;  Service: General;  Laterality: Right;  Floyce Stakes, RNFA to assist  . PORTACATH PLACEMENT Left 06/19/2020   Procedure: INSERTION PORT-A-CATH;  Surgeon: Robert Bellow, MD;  Location: [facility] ORS;  Service: General;  Laterality: Left;    SOCIAL HISTORY: Social History   Socioeconomic History  . Marital status: Married    Spouse name: Not on file  . Number of children: Not on file  . Years of education: Not on file  . Highest education level: Not on file  Occupational History  . Not on file  Tobacco Use  . Smoking status: Former Smoker    Years: 3.00    Types: Cigarettes    Quit date: 11/23/1968    Years since quitting: 52.0  . Smokeless tobacco: Never Used  Vaping Use  . Vaping Use: Never used  Substance and Sexual Activity  . Alcohol use: Not Currently  . Drug use: Never  . Sexual activity: Not on file  Other Topics Concern  . 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 Multiple sclerosis Brother     ALLERGIES:  has  No Known Allergies.  MEDICATIONS:  Current Outpatient Medications  Medication Sig Dispense Refill  . BREO ELLIPTA 100-25 MCG/INH AEPB Inhale 1 puff into the lungs at bedtime.    . lidocaine-prilocaine (EMLA) cream Apply 1 application topically as needed. 30 g 3  . lisinopril (ZESTRIL) 10 MG tablet     . ondansetron (ZOFRAN) 8 MG tablet One pill every 8 hours as needed for nausea/vomitting. 40 tablet 1  . potassium chloride SA (KLOR-CON M20) 20 MEQ tablet TAKE 1 TABLET BY MOUTH TWICE A DAY 60 tablet 3  . predniSONE (DELTASONE) 20 MG tablet Take 3 pills once a day x 3 days; 2 pills/day for 3 days; and the once a day. Take with Breakfast. 30 tablet 0  . prochlorperazine (COMPAZINE) 10 MG tablet Take 1 tablet (10 mg total) by mouth every 6 (six) hours as needed for nausea or vomiting. 40 tablet 1  . tiotropium (SPIRIVA) 18 MCG inhalation capsule Place 18 mcg into inhaler and inhale at bedtime.      No current facility-administered medications for this visit.   Facility-Administered Medications Ordered in Other Visits  Medication Dose Route Frequency Provider Last Rate Last Admin  . heparin  lock flush 100 unit/mL  500 Units Intravenous Once Charlaine Dalton R, MD          .  PHYSICAL EXAMINATION: ECOG PERFORMANCE STATUS: 1 - Symptomatic but completely ambulatory  Vitals:   11/25/20 0907  BP: (!) 147/70  Pulse: (!) 54  Temp: 98.2 F (36.8 C)  SpO2: 98%   Filed Weights   11/25/20 0907  Weight: 243 lb 4.8 oz (110.4 kg)    Physical Exam HENT:     Head: Normocephalic and atraumatic.     Mouth/Throat:     Pharynx: No oropharyngeal exudate.  Eyes:     Pupils: Pupils are equal, round, and reactive to light.  Cardiovascular:     Rate and Rhythm: Normal rate and regular rhythm.  Pulmonary:     Effort: No respiratory distress.     Breath sounds: No wheezing.  Abdominal:     General: Bowel sounds are normal. There is no distension.     Palpations: Abdomen is soft. There is no  mass.     Tenderness: There is no abdominal tenderness. There is no guarding or rebound.  Musculoskeletal:        General: No tenderness. Normal range of motion.     Cervical back: Normal range of motion and neck supple.  Skin:    General: Skin is warm.  Neurological:     Mental Status: He is alert and oriented to person, place, and time.  Psychiatric:        Mood and Affect: Affect normal.      LABORATORY DATA:  I have reviewed the data as listed Lab Results  Component Value Date   WBC 9.3 11/25/2020   HGB 12.3 (L) 11/25/2020   HCT 36.8 (L) 11/25/2020   MCV 97.1 11/25/2020   PLT 190 11/25/2020   Recent Labs    07/23/20 0829 08/06/20 0852 08/20/20 0821 09/03/20 1033 10/14/20 0851 10/28/20 0837 11/11/20 0848  NA 141 139 138   < > 140 139 140  K 3.5 4.8 4.2   < > 3.9 4.2 3.6  CL 109 104 107   < > 105 104 105  CO2 _0 < > _1 GLUCOSE 137* 103* 101*   < > 105* 101* 88  BUN _2 < > _3 CREATININE 0.81 0.84 0.92   < > 0.85 0.89 0.87  CALCIUM 8.2* 8.8* 8.8*   < > 9.1 9.0 9.0  GFRNONAA >60 >60 >60   < > >60 >60 >60  GFRAA >60 >60 >60  --   --   --   --   PROT 6.4* 7.2 6.7   < > 6.9 6.7 6.6  ALBUMIN 2.9* 3.3* 3.4*   < > 3.6 3.7 3.6  AST 19 39 26   < > _4 ALT 22 35 22   < > _5 ALKPHOS 64 95 59   < > 47 53 46  BILITOT 1.0 1.0 0.9   < > 1.5* 1.5* 1.8*   < > = values in this interval not displayed.    RADIOGRAPHIC STUDIES: I have personally reviewed the radiological images as listed and agreed with the findings in the report. No results found.  ASSESSMENT & PLAN:  #Colon cancer stage III-PT4BN2B-  -Status post right IMA colectomy in November 2021. -Most recent CT CAP from 10/21/2020 showed no evidence of recurrent or metastatic disease.  Mid and lower  lung zone predominant peripheral groundglass and mild architectural distortion new from prior exams.  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 -Rising CEA -Labs from today are stable except for slight decline in potassium levels. -Proceed with treatment today  #Hypokalemia- -Likely secondary to diarrhea. -Continue potassium supplements.  -We will recheck labs at next visit.  # Elevated CEA- -Labs from 11/11/2020 show CEA of 18 (17.9). -No evidence of progression of disease on CT scan however there is some concern for nonradiologic progression/recurrence. -We will continue to monitor CEA. -If it continues to rise, patient will need PET scan.  # minimal GOO in Bil Lungs-  -Post Covid? -Asymptomatic  #  Enlarged prostate- -Discovered incidentally on CT scan.   -PSA is 2.61.   -Monitor   # Diarrhea-  -Secondary to 5-FU -Grade 1  -Stable  # COPD- -Continue inhalers   # DISPOSITION- -Proceed with 5-FU today. -RTC in 2 weeks for repeat labs, MD and cycle 12 continuous 5-FU.  Greater than 50% was spent in counseling and coordination of care with this patient including but not limited to discussion of the relevant topics above (See A&P) including, but not limited to diagnosis and management of acute and chronic medical conditions.   No problem-specific Assessment & Plan notes found for this encounter.  All questions were answered. The patient knows to call the clinic with any problems, questions or concerns.    Jacquelin Hawking, NP 11/25/2020 9:16 AM

## 2020-11-26 LAB — CEA: CEA: 15.3 ng/mL — ABNORMAL HIGH (ref 0.0–4.7)

## 2020-11-27 ENCOUNTER — Inpatient Hospital Stay: Payer: Medicare HMO

## 2020-11-27 DIAGNOSIS — Z5111 Encounter for antineoplastic chemotherapy: Secondary | ICD-10-CM | POA: Diagnosis not present

## 2020-11-27 DIAGNOSIS — C182 Malignant neoplasm of ascending colon: Secondary | ICD-10-CM

## 2020-11-27 DIAGNOSIS — Z7189 Other specified counseling: Secondary | ICD-10-CM

## 2020-11-27 MED ORDER — HEPARIN SOD (PORK) LOCK FLUSH 100 UNIT/ML IV SOLN
500.0000 [IU] | Freq: Once | INTRAVENOUS | Status: AC | PRN
  Administered 2020-11-27: 500 [IU]
  Filled 2020-11-27: qty 5

## 2020-11-27 MED ORDER — SODIUM CHLORIDE 0.9% FLUSH
10.0000 mL | INTRAVENOUS | Status: DC | PRN
  Administered 2020-11-27: 10 mL
  Filled 2020-11-27: qty 10

## 2020-12-07 ENCOUNTER — Other Ambulatory Visit: Payer: Self-pay | Admitting: Internal Medicine

## 2020-12-08 ENCOUNTER — Other Ambulatory Visit: Payer: Self-pay | Admitting: Internal Medicine

## 2020-12-08 NOTE — Assessment & Plan Note (Deleted)
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 On adjuvant CIV 5FU. STABLE; nov 29th CT- Right hemicolectomy. No evidence of recurrent or metastatic disease; GOO  In lungs [see below] however, CEA rising. Will consider PET scan if CEA continues to rise.   # proceed with CIV 5FU # 10/of planned # 12 today. Labs today reviewed;  acceptable for treatment today.   # Elevated CEA-17-;no evidence of any progressive disease noted on CT scan; however am still concerned about non-radiologic true progression/recurrence. Discussed with patient. Monitor closely for now; again if worse- would recommend PET scan.   # minimal GOO in Bil Lungs- ? Post COVID -clinically asymptomatic.  Monitor for now.  #  Enlarged prostate [incidental on CT]- check PSA; asymtomatic. Monitor for now.   # Diarrhea- G-1; STABLE;  sec to 5FU; monitor for now.  # COPD- STABLE;   continue inhalers.   # DISPOSITION: # 5FU CIV  pump off d-3; # follow up in 2 weeks-X- MD; labs- cbc/cmp;CEA; PSA; 5FU CIV only; D-3 pump off;Dr.B   

## 2020-12-08 NOTE — Progress Notes (Deleted)
[place] NOTE  Patient Care Team: Albina Billet, MD as PCP - General (Internal Medicine) Clent Jacks, RN as Oncology Nurse Navigator Cammie Sickle, MD as Consulting Physician (Hematology and Oncology)  CHIEF COMPLAINTS/PURPOSE OF CONSULTATION:  ***  #  Oncology History Overview Note  # July 2021- RIGHT COLON ADENO CARCINOMA- Dr.Byrnett s/p right hemicolectomy- pT4b N2b- STAGE III [high risk]; MSI-STABLE.   # AUG 10th, 2021- FOLFOX [adjuvant]  -------------------------------------------------------------------------------------------   - INVASIVE MODERATELY DIFFERENTIATED ADENOCARCINOMA.  - TEN OF FIFTY-TWO LYMPH NODES POSITIVE FOR METASTATIC ADENOCARCINOMA  (10/52).  - SINGLE INCIDENTAL TUBULOVILLOUS ADENOMA.  - THREE INCIDENTAL TUBULAR ADENOMAS.  - SEE CANCER SUMMARY.   CANCER CASE SUMMARY: COLON AND RECTUM  Procedure: Right hemicolectomy  Tumor Site: Cecum  Tumor Size: Greatest dimension: 12.2 cm  Macroscopic Tumor Perforation: Present  Histologic Type: Adenocarcinoma  Histologic Grade: G2: Moderately differentiated  Tumor Extension: Tumor directly invades adjacent structures (terminal  ileum)  Margins: All margins are uninvolved by invasive carcinoma, high-grade  dysplasia/intramucosal adenocarcinoma, and low-grade dysplasia  Treatment Effect: No known presurgical therapy  Lymphovascular Invasion: Present  Perineural Invasion: Present  Tumor Deposits: Present: Multiple  Regional Lymph Nodes:       Number of Lymph Nodes Involved: 10       Number of Lymph Nodes Examined: 81  -  # COPD- [Dr.Fleming];   # SURVIVORSHIP:   # GENETICS:   DIAGNOSIS: colon cancer  STAGE:   III      ;  GOALS:cure  CURRENT/MOST RECENT THERAPY : FOLFOX     Cancer of right colon ([place])  05/20/2020 Initial Diagnosis   Cancer of right colon ([place])   07/02/2020 -  Chemotherapy   The patient had dexamethasone (DECADRON) 4 MG tablet, 8 mg, Oral,  Daily, 1 of 1 cycle, Start date: --, End date: -- palonosetron (ALOXI) injection 0.25 mg, 0.25 mg, Intravenous,  Once, 1 of 1 cycle Administration: 0.25 mg (07/02/2020) leucovorin 904 mg in dextrose 5 % 250 mL infusion, 400 mg/m2 = 904 mg, Intravenous,  Once, 1 of 1 cycle oxaliplatin (ELOXATIN) 190 mg in dextrose 5 % 500 mL chemo infusion, 85 mg/m2 = 190 mg, Intravenous,  Once, 1 of 1 cycle Administration: 190 mg (07/02/2020) fluorouracil (ADRUCIL) 5,400 mg in sodium chloride 0.9 % 142 mL chemo infusion, 2,400 mg/m2 = 5,400 mg, Intravenous, 1 Day/Dose, 11 of 12 cycles Administration: 5,400 mg (07/02/2020), 5,400 mg (07/23/2020), 5,400 mg (08/06/2020), 5,400 mg (08/20/2020), 5,400 mg (09/03/2020), 5,400 mg (09/17/2020), 5,400 mg (10/01/2020), 5,400 mg (10/14/2020), 5,400 mg (10/28/2020), 5,400 mg (11/11/2020), 5,400 mg (11/25/2020)  for chemotherapy treatment.    12/08/2020 Cancer Staging   Staging form: Colon and Rectum, AJCC 8th Edition - Clinical: Stage IIIC (cT4b, cN2b, cM0) - Signed by Cammie Sickle, MD on 12/08/2020      HISTORY OF PRESENTING ILLNESS:  James Hodge 75 y.o.  male    Review of Systems  Constitutional: Negative for chills, diaphoresis, fever and malaise/fatigue.  HENT: Negative for nosebleeds and sore throat.   Eyes: Negative for double vision.  Respiratory: Negative for cough, hemoptysis, sputum production, shortness of breath and wheezing.   Cardiovascular: Negative for chest pain, palpitations and orthopnea.  Gastrointestinal: Negative for abdominal pain, blood in stool, constipation, diarrhea, heartburn, melena, nausea and vomiting.  Genitourinary: Negative for dysuria, frequency and urgency.  Musculoskeletal: Negative for back pain and joint pain.  Skin: Negative.  Negative for itching and rash.  Neurological: Negative for dizziness,  tingling, focal weakness, weakness and headaches.  Endo/Heme/Allergies: Does not bruise/bleed easily.  Psychiatric/Behavioral:  Negative for depression. The patient is not nervous/anxious and does not have insomnia.      MEDICAL HISTORY:  Past Medical History:  Diagnosis Date  . Cancer ([place])   . COPD (chronic obstructive pulmonary disease) ([place])   . Hypertension     SURGICAL HISTORY: Past Surgical History:  Procedure Laterality Date  . CHOLECYSTECTOMY     Around 2001  . COLONOSCOPY WITH PROPOFOL N/A 04/12/2020   Procedure: COLONOSCOPY WITH PROPOFOL;  Surgeon: Robert Bellow, MD;  Location: [facility];  Service: Endoscopy;  Laterality: N/A;  . COLONOSCOPY WITH PROPOFOL N/A 04/24/2020   Procedure: COLONOSCOPY WITH PROPOFOL;  Surgeon: Robert Bellow, MD;  Location: [facility];  Service: Endoscopy;  Laterality: N/A;  . ESOPHAGOGASTRODUODENOSCOPY (EGD) WITH PROPOFOL N/A 04/12/2020   Procedure: ESOPHAGOGASTRODUODENOSCOPY (EGD) WITH PROPOFOL;  Surgeon: Robert Bellow, MD;  Location: [facility];  Service: Endoscopy;  Laterality: N/A;  . EYE SURGERY Bilateral    cataract  . LAPAROSCOPIC PARTIAL COLECTOMY Right 05/20/2020   Procedure: LAPAROSCOPIC PARTIAL COLECTOMY;  Surgeon: Robert Bellow, MD;  Location: [facility]S;  Service: General;  Laterality: Right;  Floyce Stakes, RNFA to assist  . PORTACATH PLACEMENT Left 06/19/2020   Procedure: INSERTION PORT-A-CATH;  Surgeon: Robert Bellow, MD;  Location: [facility];  Service: General;  Laterality: Left;    SOCIAL HISTORY: Social History   Socioeconomic History  . Marital status: Married    Spouse name: Not on file  . Number of children: Not on file  . Years of education: Not on file  . Highest education level: Not on file  Occupational History  . Not on file  Tobacco Use  . Smoking status: Former Smoker    Years: 3.00    Types: Cigarettes    Quit date: 11/23/1968    Years since quitting: 52.0  . Smokeless tobacco: Never Used  Vaping Use  . Vaping Use: Never used  Substance and Sexual Activity  . Alcohol use: Not Currently  . Drug  use: Never  . Sexual activity: Not on file  Other Topics Concern  . 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 Multiple sclerosis Brother     ALLERGIES:  has No Known Allergies.  MEDICATIONS:  Current Outpatient Medications  Medication Sig Dispense Refill  . BREO ELLIPTA 100-25 MCG/INH AEPB Inhale 1 puff into the lungs at bedtime.    . lidocaine-prilocaine (EMLA) cream Apply 1 application topically as needed. 30 g 3  . lisinopril (ZESTRIL) 10 MG tablet     . ondansetron (ZOFRAN) 8 MG tablet One pill every 8 hours as needed for nausea/vomitting. (Patient not taking: Reported on 11/25/2020) 40 tablet 1  . potassium chloride SA (KLOR-CON M20) 20 MEQ tablet TAKE 1 TABLET BY MOUTH TWICE A DAY 60 tablet 3  . predniSONE (DELTASONE) 20 MG tablet Take 3 pills once a day x 3 days; 2 pills/day for 3 days; and the once a  day. Take with Breakfast. 30 tablet 0  . prochlorperazine (COMPAZINE) 10 MG tablet Take 1 tablet (10 mg total) by mouth every 6 (six) hours as needed for nausea or vomiting. (Patient not taking: Reported on 11/25/2020) 40 tablet 1  . tiotropium (SPIRIVA) 18 MCG inhalation capsule Place 18 mcg into inhaler and inhale at bedtime.      No current facility-administered medications for this visit.   Facility-Administered Medications Ordered in Other Visits  Medication Dose Route Frequency Provider Last Rate Last Admin  . heparin lock flush 100 unit/mL  500 Units Intravenous Once Cammie Sickle, MD          .  PHYSICAL EXAMINATION: ECOG PERFORMANCE  STATUS: {CHL ONC ECOG AJ:[id number]}  There were no vitals filed for this visit. There were no vitals filed for this visit.  Physical Exam HENT:     Head: Normocephalic and atraumatic.     Mouth/Throat:     Mouth: Oropharynx is clear and moist.     Pharynx: No oropharyngeal exudate.  Eyes:     Pupils: Pupils are equal, round, and reactive to light.  Cardiovascular:     Rate and Rhythm: Normal rate and regular rhythm.  Pulmonary:     Effort: No respiratory distress.     Breath sounds: No wheezing.     Comments: Decreased breath sounds Abdominal:     General: Bowel sounds are normal. There is no distension.     Palpations: Abdomen is soft. There is no mass.     Tenderness: There is no abdominal tenderness. There is no guarding or rebound.  Musculoskeletal:        General: No tenderness or edema. Normal range of motion.     Cervical back: Normal range of motion and neck supple.  Skin:    General: Skin is warm.  Neurological:     Mental Status: He is alert and oriented to person, place, and time.  Psychiatric:        Mood and Affect: Affect normal.      LABORATORY DATA:  I have reviewed the data as listed Lab Results  Component Value Date   WBC 9.3 11/25/2020   HGB 12.3 (L) 11/25/2020   HCT 36.8 (L) 11/25/2020   MCV 97.1 11/25/2020   PLT 190 11/25/2020   Recent Labs    07/23/20 0829 08/06/20 0852 08/20/20 0821 09/03/20 1033 10/28/20 0837 11/11/20 0848 11/25/20 0859  NA 141 139 138   < > 139 140 141  K 3.5 4.8 4.2   < > 4.2 3.6 3.1*  CL 109 104 107   < > 104 105 108  CO2 _0 < > _1 GLUCOSE 137* 103* 101*   < > 101* 88 95  BUN _2 < > _3 CREATININE 0.81 0.84 0.92   < > 0.89 0.87 0.82  CALCIUM 8.2* 8.8* 8.8*   < > 9.0 9.0 8.4*  GFRNONAA >60 >60 >60   < > >60 >60 >60  GFRAA >60 >60 >60  --   --   --   --   PROT 6.4* 7.2 6.7   < > 6.7 6.6 6.0*  ALBUMIN 2.9* 3.3* 3.4*   < > 3.7 3.6 3.3*  AST 19 39 26   < > _4 ALT 22 35 22    < > _5 ALKPHOS 64 95 59   < > 53 46  46  BILITOT 1.0 1.0 0.9   < > 1.5* 1.8* 2.0*   < > = values in this interval not displayed.    RADIOGRAPHIC STUDIES: I have personally reviewed the radiological images as listed and agreed with the findings in the report. No results found.  ASSESSMENT & PLAN:   Cancer of right colon ([place]) #Colon cancer stage III-PT4BN2B- high risk. On adjuvant CIV 5FU. STABLE; nov 29th CT- Right hemicolectomy. No evidence of recurrent or metastatic disease; GOO  In lungs [see below] however, CEA rising. Will consider PET scan if CEA continues to rise.   # proceed with CIV 5FU # 10/of planned # 12 today. Labs today reviewed;  acceptable for treatment today.   # Elevated CEA-17-;no evidence of any progressive disease noted on CT scan; however am still concerned about non-radiologic true progression/recurrence. Discussed with patient. Monitor closely for now; again if worse- would recommend PET scan.   # minimal GOO in Bil Lungs- ? Post COVID -clinically asymptomatic.  Monitor for now.  #  Enlarged prostate [incidental on CT]- check PSA; asymtomatic. Monitor for now.   # Diarrhea- G-1; STABLE;  sec to 5FU; monitor for now.  # COPD- STABLE;   continue inhalers.   # DISPOSITION: # 5FU CIV  pump off d-3; # follow up in 2 weeks-X- MD; labs- cbc/cmp;CEA; PSA; 5FU CIV only; D-3 pump off;Dr.B      All questions were answered. The patient knows to call the clinic with any problems, questions or concerns.       Cammie Sickle, MD 12/08/2020 5:18 PM

## 2020-12-09 ENCOUNTER — Encounter: Payer: Self-pay | Admitting: Internal Medicine

## 2020-12-09 ENCOUNTER — Inpatient Hospital Stay: Payer: Medicare HMO

## 2020-12-09 ENCOUNTER — Inpatient Hospital Stay: Payer: Medicare HMO | Admitting: Internal Medicine

## 2020-12-09 ENCOUNTER — Other Ambulatory Visit: Payer: Self-pay

## 2020-12-09 DIAGNOSIS — C182 Malignant neoplasm of ascending colon: Secondary | ICD-10-CM

## 2020-12-10 ENCOUNTER — Inpatient Hospital Stay (HOSPITAL_BASED_OUTPATIENT_CLINIC_OR_DEPARTMENT_OTHER): Payer: Medicare HMO | Admitting: Internal Medicine

## 2020-12-10 ENCOUNTER — Inpatient Hospital Stay: Payer: Medicare HMO

## 2020-12-10 VITALS — BP 115/73 | HR 65 | Temp 97.6°F | Resp 20 | Ht 75.0 in | Wt 243.0 lb

## 2020-12-10 DIAGNOSIS — Z5111 Encounter for antineoplastic chemotherapy: Secondary | ICD-10-CM | POA: Diagnosis not present

## 2020-12-10 DIAGNOSIS — C182 Malignant neoplasm of ascending colon: Secondary | ICD-10-CM | POA: Diagnosis not present

## 2020-12-10 DIAGNOSIS — Z7189 Other specified counseling: Secondary | ICD-10-CM

## 2020-12-10 LAB — CBC WITH DIFFERENTIAL/PLATELET
Abs Immature Granulocytes: 0.04 10*3/uL (ref 0.00–0.07)
Basophils Absolute: 0.1 10*3/uL (ref 0.0–0.1)
Basophils Relative: 1 %
Eosinophils Absolute: 0.4 10*3/uL (ref 0.0–0.5)
Eosinophils Relative: 3 %
HCT: 41.1 % (ref 39.0–52.0)
Hemoglobin: 13.8 g/dL (ref 13.0–17.0)
Immature Granulocytes: 0 %
Lymphocytes Relative: 27 %
Lymphs Abs: 2.8 10*3/uL (ref 0.7–4.0)
MCH: 33.3 pg (ref 26.0–34.0)
MCHC: 33.6 g/dL (ref 30.0–36.0)
MCV: 99.3 fL (ref 80.0–100.0)
Monocytes Absolute: 0.9 10*3/uL (ref 0.1–1.0)
Monocytes Relative: 9 %
Neutro Abs: 6.2 10*3/uL (ref 1.7–7.7)
Neutrophils Relative %: 60 %
Platelets: 227 10*3/uL (ref 150–400)
RBC: 4.14 MIL/uL — ABNORMAL LOW (ref 4.22–5.81)
RDW: 14.6 % (ref 11.5–15.5)
WBC: 10.3 10*3/uL (ref 4.0–10.5)
nRBC: 0 % (ref 0.0–0.2)

## 2020-12-10 LAB — COMPREHENSIVE METABOLIC PANEL
ALT: 18 U/L (ref 0–44)
AST: 23 U/L (ref 15–41)
Albumin: 3.8 g/dL (ref 3.5–5.0)
Alkaline Phosphatase: 49 U/L (ref 38–126)
Anion gap: 5 (ref 5–15)
BUN: 17 mg/dL (ref 8–23)
CO2: 28 mmol/L (ref 22–32)
Calcium: 9.3 mg/dL (ref 8.9–10.3)
Chloride: 106 mmol/L (ref 98–111)
Creatinine, Ser: 0.88 mg/dL (ref 0.61–1.24)
GFR, Estimated: >60 mL/min (ref >60)
Glucose, Bld: 106 mg/dL — ABNORMAL HIGH (ref 70–99)
Potassium: 4.5 mmol/L (ref 3.5–5.1)
Sodium: 139 mmol/L (ref 135–145)
Total Bilirubin: 1.4 mg/dL — ABNORMAL HIGH (ref 0.3–1.2)
Total Protein: 7 g/dL (ref 6.5–8.1)

## 2020-12-10 MED ORDER — SODIUM CHLORIDE 0.9 % IV SOLN
10.0000 mg | Freq: Once | INTRAVENOUS | Status: AC
  Administered 2020-12-10: 10 mg via INTRAVENOUS
  Filled 2020-12-10: qty 10

## 2020-12-10 MED ORDER — SODIUM CHLORIDE 0.9 % IV SOLN
Freq: Once | INTRAVENOUS | Status: AC
  Filled 2020-12-10: qty 250

## 2020-12-10 MED ORDER — SODIUM CHLORIDE 0.9 % IV SOLN
2400.0000 mg/m2 | INTRAVENOUS | Status: DC
  Administered 2020-12-10: 5400 mg via INTRAVENOUS
  Filled 2020-12-10: qty 108

## 2020-12-10 NOTE — Progress Notes (Signed)
Pt tolerated treatment well. No s/s of distress or reaction noted. Pt stable at discharge.  

## 2020-12-10 NOTE — Progress Notes (Signed)
[place] NOTE  Patient Care Team: Albina Billet, MD as PCP - General (Internal Medicine) Clent Jacks, RN as Oncology Nurse Navigator Cammie Sickle, MD as Consulting Physician (Hematology and Oncology)  CHIEF COMPLAINTS/PURPOSE OF CONSULTATION:  Colon cancer  #  Oncology History Overview Note  # July 2021- RIGHT COLON ADENO CARCINOMA- Dr.Byrnett s/p right hemicolectomy- pT4b N2b- STAGE III [high risk]; MSI-STABLE.   # AUG 10th, 2021- FOLFOX [adjuvant]  -------------------------------------------------------------------------------------------   - INVASIVE MODERATELY DIFFERENTIATED ADENOCARCINOMA.  - TEN OF FIFTY-TWO LYMPH NODES POSITIVE FOR METASTATIC ADENOCARCINOMA  (10/52).  - SINGLE INCIDENTAL TUBULOVILLOUS ADENOMA.  - THREE INCIDENTAL TUBULAR ADENOMAS.  - SEE CANCER SUMMARY.   CANCER CASE SUMMARY: COLON AND RECTUM  Procedure: Right hemicolectomy  Tumor Site: Cecum  Tumor Size: Greatest dimension: 12.2 cm  Macroscopic Tumor Perforation: Present  Histologic Type: Adenocarcinoma  Histologic Grade: G2: Moderately differentiated  Tumor Extension: Tumor directly invades adjacent structures (terminal  ileum)  Margins: All margins are uninvolved by invasive carcinoma, high-grade  dysplasia/intramucosal adenocarcinoma, and low-grade dysplasia  Treatment Effect: No known presurgical therapy  Lymphovascular Invasion: Present  Perineural Invasion: Present  Tumor Deposits: Present: Multiple  Regional Lymph Nodes:       Number of Lymph Nodes Involved: 10       Number of Lymph Nodes Examined: 66  -  # COPD- [Dr.Fleming];   # SURVIVORSHIP:   # GENETICS:   DIAGNOSIS: colon cancer  STAGE:   III      ;  GOALS:cure  CURRENT/MOST RECENT THERAPY : FOLFOX     Cancer of right colon ([place])  05/20/2020 Initial Diagnosis   Cancer of right colon ([place])   07/02/2020 -  Chemotherapy    Patient is on Treatment Plan: COLORECTAL 5FU Q14D X 6  MONTHS      12/08/2020 Cancer Staging   Staging form: Colon and Rectum, AJCC 8th Edition - Clinical: Stage IIIC (cT4b, cN2b, cM0) - Signed by Cammie Sickle, MD on 12/08/2020      HISTORY OF PRESENTING ILLNESS:  James Hodge 75 y.o.  male high risk stage III colon cancer currently on adjuvant 5-FU continuous infusion is here for follow-up.  Patient denies any worsening diarrhea.  Denies any worsening tingling or numbness.  No oral ulcers.  Chronic mild shortness of breath.  Not any worse.   Review of Systems  Constitutional: Negative for chills, diaphoresis, fever and malaise/fatigue.  HENT: Negative for nosebleeds and sore throat.   Eyes: Negative for double vision.  Respiratory: Positive for shortness of breath. Negative for cough, hemoptysis, sputum production and wheezing.   Cardiovascular: Negative for chest pain, palpitations and orthopnea.  Gastrointestinal: Negative for abdominal pain, blood in stool, constipation, diarrhea, heartburn, melena, nausea and vomiting.  Genitourinary: Negative for dysuria, frequency and urgency.  Musculoskeletal: Negative for back pain and joint pain.  Skin: Negative.  Negative for itching and rash.  Neurological: Negative for dizziness, tingling, focal weakness, weakness and headaches.  Endo/Heme/Allergies: Does not bruise/bleed easily.  Psychiatric/Behavioral: Negative for depression. The patient is not nervous/anxious and does not have insomnia.      MEDICAL HISTORY:  Past Medical History:  Diagnosis Date  . Cancer ([place])   . COPD (chronic obstructive pulmonary disease) ([place])   . Hypertension     SURGICAL HISTORY: Past Surgical History:  Procedure Laterality Date  . CHOLECYSTECTOMY     Around 2001  . COLONOSCOPY WITH PROPOFOL N/A 04/12/2020   Procedure: COLONOSCOPY WITH PROPOFOL;  Surgeon: Robert Bellow, MD;  Location: [facility] ENDOSCOPY;  Service: Endoscopy;  Laterality: N/A;  . COLONOSCOPY WITH PROPOFOL N/A 04/24/2020    Procedure: COLONOSCOPY WITH PROPOFOL;  Surgeon: Robert Bellow, MD;  Location: [facility] ENDOSCOPY;  Service: Endoscopy;  Laterality: N/A;  . ESOPHAGOGASTRODUODENOSCOPY (EGD) WITH PROPOFOL N/A 04/12/2020   Procedure: ESOPHAGOGASTRODUODENOSCOPY (EGD) WITH PROPOFOL;  Surgeon: Robert Bellow, MD;  Location: [facility] ENDOSCOPY;  Service: Endoscopy;  Laterality: N/A;  . EYE SURGERY Bilateral    cataract  . LAPAROSCOPIC PARTIAL COLECTOMY Right 05/20/2020   Procedure: LAPAROSCOPIC PARTIAL COLECTOMY;  Surgeon: Robert Bellow, MD;  Location: [facility] ORS;  Service: General;  Laterality: Right;  Floyce Stakes, RNFA to assist  . PORTACATH PLACEMENT Left 06/19/2020   Procedure: INSERTION PORT-A-CATH;  Surgeon: Robert Bellow, MD;  Location: [facility] ORS;  Service: General;  Laterality: Left;    SOCIAL HISTORY: Social History   Socioeconomic History  . Marital status: Married    Spouse name: Not on file  . Number of children: Not on file  . Years of education: Not on file  . Highest education level: Not on file  Occupational History  . Not on file  Tobacco Use  . Smoking status: Former Smoker    Years: 3.00    Types: Cigarettes    Quit date: 11/23/1968    Years since quitting: 52.0  . Smokeless tobacco: Never Used  Vaping Use  . Vaping Use: Never used  Substance and Sexual Activity  . Alcohol use: Not Currently  . Drug use: Never  . Sexual activity: Not on file  Other Topics Concern  . 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 Multiple sclerosis Brother      ALLERGIES:  has No Known Allergies.  MEDICATIONS:  Current Outpatient Medications  Medication Sig Dispense Refill  . BREO ELLIPTA 100-25 MCG/INH AEPB Inhale 1 puff into the lungs at bedtime.    . lidocaine-prilocaine (EMLA) cream Apply 1 application topically as needed. 30 g 3  . lisinopril (ZESTRIL) 10 MG tablet     . potassium chloride SA (KLOR-CON M20) 20 MEQ tablet TAKE 1 TABLET BY MOUTH TWICE A DAY 60 tablet 3  . predniSONE (DELTASONE) 20 MG tablet Take 3 pills once a day x 3 days; 2 pills/day for 3 days; and the once a day. Take with Breakfast. 30 tablet 0  . tiotropium (SPIRIVA) 18 MCG inhalation capsule Place 18 mcg into inhaler and inhale at bedtime.     . ondansetron (ZOFRAN) 8 MG tablet One pill every 8 hours as needed for nausea/vomitting. (Patient not taking: No sig reported) 40 tablet 1  . prochlorperazine (COMPAZINE) 10 MG tablet Take 1 tablet (10 mg total) by mouth every 6 (six) hours as needed for nausea or vomiting. (Patient not taking: No sig reported) 40 tablet 1   No current facility-administered medications for this visit.   Facility-Administered Medications Ordered in Other Visits  Medication Dose Route Frequency Provider Last Rate Last Admin  . fluorouracil (ADRUCIL) 5,400 mg  in sodium chloride 0.9 % 142 mL chemo infusion  2,400 mg/m2 (Treatment Plan Recorded) Intravenous 1 day or 1 dose Charlaine Dalton R, MD   5,400 mg at 12/10/20 1225  . heparin lock flush 100 unit/mL  500 Units Intravenous Once Cammie Sickle, MD          .  PHYSICAL EXAMINATION: ECOG PERFORMANCE STATUS: 0 - Asymptomatic  Vitals:   12/10/20 1136  BP: 115/73  Pulse: 65  Resp: 20  Temp: 97.6 F (36.4 C)   Filed Weights   12/09/20 0800  Weight: 243 lb (110.2 kg)    Physical Exam HENT:     Head: Normocephalic and atraumatic.     Mouth/Throat:     Pharynx: No oropharyngeal exudate.  Eyes:     Pupils: Pupils are equal, round, and reactive to light.  Cardiovascular:      Rate and Rhythm: Normal rate and regular rhythm.  Pulmonary:     Effort: No respiratory distress.     Breath sounds: No wheezing.     Comments: Decreased breath sounds Abdominal:     General: Bowel sounds are normal. There is no distension.     Palpations: Abdomen is soft. There is no mass.     Tenderness: There is no abdominal tenderness. There is no guarding or rebound.  Musculoskeletal:        General: No tenderness. Normal range of motion.     Cervical back: Normal range of motion and neck supple.  Skin:    General: Skin is warm.  Neurological:     Mental Status: He is alert and oriented to person, place, and time.  Psychiatric:        Mood and Affect: Affect normal.      LABORATORY DATA:  I have reviewed the data as listed Lab Results  Component Value Date   WBC 10.3 12/10/2020   HGB 13.8 12/10/2020   HCT 41.1 12/10/2020   MCV 99.3 12/10/2020   PLT 227 12/10/2020   Recent Labs    07/23/20 0829 08/06/20 0852 08/20/20 0821 09/03/20 1033 11/11/20 0848 11/25/20 0859 12/10/20 1030  NA 141 139 138   < > 140 141 139  K 3.5 4.8 4.2   < > 3.6 3.1* 4.5  CL 109 104 107   < > 105 108 106  CO2 '23 28 25   ' < > '28 25 28  ' GLUCOSE 137* 103* 101*   < > 88 95 106*  BUN '12 14 14   ' < > '15 12 17  ' CREATININE 0.81 0.84 0.92   < > 0.87 0.82 0.88  CALCIUM 8.2* 8.8* 8.8*   < > 9.0 8.4* 9.3  GFRNONAA >60 >60 >60   < > >60 >60 >60  GFRAA >60 >60 >60  --   --   --   --   PROT 6.4* 7.2 6.7   < > 6.6 6.0* 7.0  ALBUMIN 2.9* 3.3* 3.4*   < > 3.6 3.3* 3.8  AST 19 39 26   < > '19 18 23  ' ALT 22 35 22   < > '16 13 18  ' ALKPHOS 64 95 59   < > 46 46 49  BILITOT 1.0 1.0 0.9   < > 1.8* 2.0* 1.4*   < > = values in this interval not displayed.    RADIOGRAPHIC STUDIES: I have personally reviewed the radiological images as listed and agreed with the findings in the report. No results found.  ASSESSMENT &  PLAN:   Cancer of right colon ([place]) #Colon cancer stage III-PT4BN2B- high risk. On  adjuvant CIV 5FU. STABLE; nov 29th CT- Right hemicolectomy. No evidence of recurrent or metastatic disease; GOO  In lungs [see below] however, CEA rising.  # proceed with CIV 5FU # 12/of planned # 12 today. Labs today reviewed;  acceptable for treatment today. Will order PET scan today.   # Elevated CEA-17; NOV 29th- CT;no evidence of any progressive disease noted on CT scan; will order PET as above.   # minimal GOO in Bil Lungs- ? Post COVID; STABLE; -clinically asymptomatic.  Monitor for now.  #  Enlarged prostate [incidental on CT]- PSA- WNL. STABLE   # COPD- STABLE;   continue inhalers.   # DISPOSITION: # 5FU CIV  pump off d-3; # follow up in 6 weeks-  MD; labs- cbc/cmp;CEA; NO Chemo; port flush; PET prior-;Dr.B      All questions were answered. The patient knows to call the clinic with any problems, questions or concerns.       Cammie Sickle, MD 12/10/2020 12:57 PM

## 2020-12-10 NOTE — Assessment & Plan Note (Addendum)
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 On adjuvant CIV 5FU. STABLE; nov 29th CT- Right hemicolectomy. No evidence of recurrent or metastatic disease; GOO  In lungs [see below] however, CEA rising.  # proceed with CIV 5FU # 12/of planned # 12 today. Labs today reviewed;  acceptable for treatment today. Will order PET scan today.   # Elevated CEA-17; NOV 29th- CT;no evidence of any progressive disease noted on CT scan; will order PET as above.   # minimal GOO in Bil Lungs- ? Post COVID; STABLE; -clinically asymptomatic.  Monitor for now.  #  Enlarged prostate [incidental on CT]- PSA- WNL. STABLE   # COPD- STABLE;   continue inhalers.   # DISPOSITION: # 5FU CIV  pump off d-3; # follow up in 6 weeks-  MD; labs- cbc/cmp;CEA; NO Chemo; port flush; PET prior-;Dr.B

## 2020-12-11 ENCOUNTER — Inpatient Hospital Stay: Payer: Medicare HMO

## 2020-12-12 ENCOUNTER — Inpatient Hospital Stay: Payer: Medicare HMO

## 2020-12-12 DIAGNOSIS — Z7189 Other specified counseling: Secondary | ICD-10-CM

## 2020-12-12 DIAGNOSIS — Z5111 Encounter for antineoplastic chemotherapy: Secondary | ICD-10-CM | POA: Diagnosis not present

## 2020-12-12 DIAGNOSIS — C182 Malignant neoplasm of ascending colon: Secondary | ICD-10-CM

## 2020-12-12 MED ORDER — SODIUM CHLORIDE 0.9% FLUSH
10.0000 mL | INTRAVENOUS | Status: DC | PRN
  Administered 2020-12-12: 10 mL
  Filled 2020-12-12: qty 10

## 2020-12-12 MED ORDER — HEPARIN SOD (PORK) LOCK FLUSH 100 UNIT/ML IV SOLN
500.0000 [IU] | Freq: Once | INTRAVENOUS | Status: AC | PRN
  Administered 2020-12-12: 500 [IU]
  Filled 2020-12-12: qty 5

## 2020-12-12 MED ORDER — HEPARIN SOD (PORK) LOCK FLUSH 100 UNIT/ML IV SOLN
INTRAVENOUS | Status: AC
  Filled 2020-12-12: qty 5

## 2021-01-14 ENCOUNTER — Telehealth: Payer: Self-pay | Admitting: Internal Medicine

## 2021-01-14 NOTE — Telephone Encounter (Signed)
On 2/22-I spoke to [facility], insurance/ physician regarding a PET scan approval.  The doctor will inform us of the decision in the next few days.  If declined will need to order a CT scan instead.  Thanks GB

## 2021-01-16 ENCOUNTER — Ambulatory Visit
Admission: RE | Admit: 2021-01-16 | Discharge: 2021-01-16 | Disposition: A | Discharge status: Home / Self Care | Payer: Medicare HMO | Source: Ambulatory Visit | Attending: Internal Medicine | Admitting: Internal Medicine

## 2021-01-16 ENCOUNTER — Other Ambulatory Visit: Payer: Self-pay

## 2021-01-16 DIAGNOSIS — C182 Malignant neoplasm of ascending colon: Secondary | ICD-10-CM | POA: Insufficient documentation

## 2021-01-16 LAB — GLUCOSE, CAPILLARY: Glucose-Capillary: 88 mg/dL (ref 70–99)

## 2021-01-16 MED ORDER — FLUDEOXYGLUCOSE F - 18 (FDG) INJECTION
12.5000 | Freq: Once | INTRAVENOUS | Status: AC | PRN
  Administered 2021-01-16: 13.35 via INTRAVENOUS

## 2021-01-20 ENCOUNTER — Other Ambulatory Visit: Payer: Self-pay

## 2021-01-20 DIAGNOSIS — C182 Malignant neoplasm of ascending colon: Secondary | ICD-10-CM

## 2021-01-21 ENCOUNTER — Inpatient Hospital Stay (HOSPITAL_BASED_OUTPATIENT_CLINIC_OR_DEPARTMENT_OTHER): Payer: Medicare HMO | Admitting: Internal Medicine

## 2021-01-21 ENCOUNTER — Encounter: Payer: Self-pay | Admitting: Internal Medicine

## 2021-01-21 ENCOUNTER — Other Ambulatory Visit: Payer: Self-pay | Admitting: *Deleted

## 2021-01-21 ENCOUNTER — Inpatient Hospital Stay: Payer: Medicare HMO | Attending: Internal Medicine

## 2021-01-21 DIAGNOSIS — Z95828 Presence of other vascular implants and grafts: Secondary | ICD-10-CM

## 2021-01-21 DIAGNOSIS — Z87891 Personal history of nicotine dependence: Secondary | ICD-10-CM | POA: Insufficient documentation

## 2021-01-21 DIAGNOSIS — C182 Malignant neoplasm of ascending colon: Secondary | ICD-10-CM

## 2021-01-21 DIAGNOSIS — N400 Enlarged prostate without lower urinary tract symptoms: Secondary | ICD-10-CM | POA: Diagnosis not present

## 2021-01-21 DIAGNOSIS — J449 Chronic obstructive pulmonary disease, unspecified: Secondary | ICD-10-CM | POA: Diagnosis not present

## 2021-01-21 DIAGNOSIS — Z9049 Acquired absence of other specified parts of digestive tract: Secondary | ICD-10-CM | POA: Insufficient documentation

## 2021-01-21 DIAGNOSIS — Z85038 Personal history of other malignant neoplasm of large intestine: Secondary | ICD-10-CM | POA: Insufficient documentation

## 2021-01-21 LAB — COMPREHENSIVE METABOLIC PANEL
ALT: 16 U/L (ref 0–44)
AST: 21 U/L (ref 15–41)
Albumin: 3.4 g/dL — ABNORMAL LOW (ref 3.5–5.0)
Alkaline Phosphatase: 45 U/L (ref 38–126)
Anion gap: 9 (ref 5–15)
BUN: 14 mg/dL (ref 8–23)
CO2: 28 mmol/L (ref 22–32)
Calcium: 8.5 mg/dL — ABNORMAL LOW (ref 8.9–10.3)
Chloride: 106 mmol/L (ref 98–111)
Creatinine, Ser: 0.67 mg/dL (ref 0.61–1.24)
GFR, Estimated: >60 mL/min (ref >60)
Glucose, Bld: 82 mg/dL (ref 70–99)
Potassium: 3.6 mmol/L (ref 3.5–5.1)
Sodium: 143 mmol/L (ref 135–145)
Total Bilirubin: 1.9 mg/dL — ABNORMAL HIGH (ref 0.3–1.2)
Total Protein: 6.3 g/dL — ABNORMAL LOW (ref 6.5–8.1)

## 2021-01-21 LAB — CBC WITH DIFFERENTIAL/PLATELET
Abs Immature Granulocytes: 0.02 10*3/uL (ref 0.00–0.07)
Basophils Absolute: 0.1 10*3/uL (ref 0.0–0.1)
Basophils Relative: 1 %
Eosinophils Absolute: 0.4 10*3/uL (ref 0.0–0.5)
Eosinophils Relative: 4 %
HCT: 39.5 % (ref 39.0–52.0)
Hemoglobin: 13.2 g/dL (ref 13.0–17.0)
Immature Granulocytes: 0 %
Lymphocytes Relative: 25 %
Lymphs Abs: 2.3 10*3/uL (ref 0.7–4.0)
MCH: 33.1 pg (ref 26.0–34.0)
MCHC: 33.4 g/dL (ref 30.0–36.0)
MCV: 99 fL (ref 80.0–100.0)
Monocytes Absolute: 0.9 10*3/uL (ref 0.1–1.0)
Monocytes Relative: 10 %
Neutro Abs: 5.5 10*3/uL (ref 1.7–7.7)
Neutrophils Relative %: 60 %
Platelets: 224 10*3/uL (ref 150–400)
RBC: 3.99 MIL/uL — ABNORMAL LOW (ref 4.22–5.81)
RDW: 12.9 % (ref 11.5–15.5)
WBC: 9.2 10*3/uL (ref 4.0–10.5)
nRBC: 0 % (ref 0.0–0.2)

## 2021-01-21 MED ORDER — HEPARIN SOD (PORK) LOCK FLUSH 100 UNIT/ML IV SOLN
500.0000 [IU] | Freq: Once | INTRAVENOUS | Status: AC
  Administered 2021-01-21: 500 [IU] via INTRAVENOUS
  Filled 2021-01-21: qty 5

## 2021-01-21 MED ORDER — HEPARIN SOD (PORK) LOCK FLUSH 100 UNIT/ML IV SOLN
INTRAVENOUS | Status: AC
  Filled 2021-01-21: qty 5

## 2021-01-21 MED ORDER — SODIUM CHLORIDE 0.9% FLUSH
10.0000 mL | Freq: Once | INTRAVENOUS | Status: AC
  Administered 2021-01-21: 10 mL via INTRAVENOUS
  Filled 2021-01-21: qty 10

## 2021-01-21 NOTE — Progress Notes (Signed)
[name] NOTE  Patient Care Team: Albina Billet, MD as PCP - General (Internal Medicine) Clent Jacks, RN as Oncology Nurse Navigator Cammie Sickle, MD as Consulting Physician (Hematology and Oncology)  CHIEF COMPLAINTS/PURPOSE OF CONSULTATION:  Colon cancer  #  Oncology History Overview Note  # July 2021- RIGHT COLON ADENO CARCINOMA- Dr.Byrnett s/p right hemicolectomy- pT4b N2b- STAGE III [high risk]; MSI-STABLE.   # AUG 10th, 2021- FOLFOX [adjuvant]  -------------------------------------------------------------------------------------------   - INVASIVE MODERATELY DIFFERENTIATED ADENOCARCINOMA.  - TEN OF FIFTY-TWO LYMPH NODES POSITIVE FOR METASTATIC ADENOCARCINOMA  (10/52).  - SINGLE INCIDENTAL TUBULOVILLOUS ADENOMA.  - THREE INCIDENTAL TUBULAR ADENOMAS.  - SEE CANCER SUMMARY.   CANCER CASE SUMMARY: COLON AND RECTUM  Procedure: Right hemicolectomy  Tumor Site: Cecum  Tumor Size: Greatest dimension: 12.2 cm  Macroscopic Tumor Perforation: Present  Histologic Type: Adenocarcinoma  Histologic Grade: G2: Moderately differentiated  Tumor Extension: Tumor directly invades adjacent structures (terminal  ileum)  Margins: All margins are uninvolved by invasive carcinoma, high-grade  dysplasia/intramucosal adenocarcinoma, and low-grade dysplasia  Treatment Effect: No known presurgical therapy  Lymphovascular Invasion: Present  Perineural Invasion: Present  Tumor Deposits: Present: Multiple  Regional Lymph Nodes:       Number of Lymph Nodes Involved: 10       Number of Lymph Nodes Examined: 29  -  # COPD- [Dr.Fleming];   # SURVIVORSHIP:   # GENETICS:   DIAGNOSIS: colon cancer  STAGE:   III      ;  GOALS:cure  CURRENT/MOST RECENT THERAPY : FOLFOX     Cancer of right colon ([place])  05/20/2020 Initial Diagnosis   Cancer of right colon ([place])   07/02/2020 -  Chemotherapy    Patient is on Treatment Plan: COLORECTAL 5FU Q14D X 6  MONTHS      12/08/2020 Cancer Staging   Staging form: Colon and Rectum, AJCC 8th Edition - Clinical: Stage IIIC (cT4b, cN2b, cM0) - Signed by Cammie Sickle, MD on 12/08/2020      HISTORY OF PRESENTING ILLNESS:  James Hodge 75 y.o.  male high risk stage III colon cancer currently on adjuvant 5-FU continuous infusion is here for follow-up/review results of the PET scan.  Patient denies any worsening diarrhea.  Denies any worsening shortness of breath or cough.  He is relieved that he is off chemotherapy now.  Review of Systems  Constitutional: Negative for chills, diaphoresis, fever and malaise/fatigue.  HENT: Negative for nosebleeds and sore throat.   Eyes: Negative for double vision.  Respiratory: Positive for shortness of breath. Negative for cough, hemoptysis, sputum production and wheezing.   Cardiovascular: Negative for chest pain, palpitations and orthopnea.  Gastrointestinal: Negative for abdominal pain, blood in stool, constipation, diarrhea, heartburn, melena, nausea and vomiting.  Genitourinary: Negative for dysuria, frequency and urgency.  Musculoskeletal: Negative for back pain and joint pain.  Skin: Negative.  Negative for itching and rash.  Neurological: Negative for dizziness, tingling, focal weakness, weakness and headaches.  Endo/Heme/Allergies: Does not bruise/bleed easily.  Psychiatric/Behavioral: Negative for depression. The patient is not nervous/anxious and does not have insomnia.      MEDICAL HISTORY:  Past Medical History:  Diagnosis Date  . Cancer ([place])   . COPD (chronic obstructive pulmonary disease) ([place])   . Hypertension     SURGICAL HISTORY: Past Surgical History:  Procedure Laterality Date  . CHOLECYSTECTOMY     Around 2001  . COLONOSCOPY WITH PROPOFOL N/A 04/12/2020   Procedure: COLONOSCOPY WITH  PROPOFOL;  Surgeon: Robert Bellow, MD;  Location: [facility] ENDOSCOPY;  Service: Endoscopy;  Laterality: N/A;  . COLONOSCOPY WITH PROPOFOL  N/A 04/24/2020   Procedure: COLONOSCOPY WITH PROPOFOL;  Surgeon: Robert Bellow, MD;  Location: [facility] ENDOSCOPY;  Service: Endoscopy;  Laterality: N/A;  . ESOPHAGOGASTRODUODENOSCOPY (EGD) WITH PROPOFOL N/A 04/12/2020   Procedure: ESOPHAGOGASTRODUODENOSCOPY (EGD) WITH PROPOFOL;  Surgeon: Robert Bellow, MD;  Location: [facility] ENDOSCOPY;  Service: Endoscopy;  Laterality: N/A;  . EYE SURGERY Bilateral    cataract  . LAPAROSCOPIC PARTIAL COLECTOMY Right 05/20/2020   Procedure: LAPAROSCOPIC PARTIAL COLECTOMY;  Surgeon: Robert Bellow, MD;  Location: [facility] ORS;  Service: General;  Laterality: Right;  Floyce Stakes, RNFA to assist  . PORTACATH PLACEMENT Left 06/19/2020   Procedure: INSERTION PORT-A-CATH;  Surgeon: Robert Bellow, MD;  Location: [facility] ORS;  Service: General;  Laterality: Left;    SOCIAL HISTORY: Social History   Socioeconomic History  . Marital status: Married    Spouse name: Not on file  . Number of children: Not on file  . Years of education: Not on file  . Highest education level: Not on file  Occupational History  . Not on file  Tobacco Use  . Smoking status: Former Smoker    Years: 3.00    Types: Cigarettes    Quit date: 11/23/1968    Years since quitting: 52.1  . Smokeless tobacco: Never Used  Vaping Use  . Vaping Use: Never used  Substance and Sexual Activity  . Alcohol use: Not Currently  . Drug use: Never  . Sexual activity: Not on file  Other Topics Concern  . 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 Multiple sclerosis  Brother     ALLERGIES:  has No Known Allergies.  MEDICATIONS:  Current Outpatient Medications  Medication Sig Dispense Refill  . BREO ELLIPTA 100-25 MCG/INH AEPB Inhale 1 puff into the lungs at bedtime.    . lidocaine-prilocaine (EMLA) cream Apply 1 application topically as needed. 30 g 3  . lisinopril (ZESTRIL) 10 MG tablet     . potassium chloride SA (KLOR-CON M20) 20 MEQ tablet TAKE 1 TABLET BY MOUTH TWICE A DAY 60 tablet 3  . predniSONE (DELTASONE) 20 MG tablet Take 3 pills once a day x 3 days; 2 pills/day for 3 days; and the once a day. Take with Breakfast. 30 tablet 0  . tiotropium (SPIRIVA) 18 MCG inhalation capsule Place 18 mcg into inhaler and inhale at bedtime.     . ondansetron (ZOFRAN) 8 MG tablet One pill every 8 hours as needed for nausea/vomitting. (Patient not taking: No sig reported) 40 tablet 1  . prochlorperazine (COMPAZINE) 10 MG tablet Take 1 tablet (10 mg total) by mouth every 6 (six) hours as needed for nausea or vomiting. (Patient not taking: No sig reported) 40 tablet 1   No current facility-administered medications for this visit.   Facility-Administered Medications Ordered in Other Visits  Medication Dose Route Frequency Provider Last Rate Last Admin  . heparin lock  flush 100 unit/mL  500 Units Intravenous Once Charlaine Dalton R, MD          .  PHYSICAL EXAMINATION: ECOG PERFORMANCE STATUS: 0 - Asymptomatic  Vitals:   01/21/21 1005  BP: 140/69  Pulse: (!) 55  Resp: 16  Temp: (!) 96 F (35.6 C)  SpO2: 99%   Filed Weights   01/21/21 1005  Weight: 251 lb (113.9 kg)    Physical Exam HENT:     Head: Normocephalic and atraumatic.     Mouth/Throat:     Pharynx: No oropharyngeal exudate.  Eyes:     Pupils: Pupils are equal, round, and reactive to light.  Cardiovascular:     Rate and Rhythm: Normal rate and regular rhythm.  Pulmonary:     Effort: No respiratory distress.     Breath sounds: No wheezing.     Comments: Decreased breath  sounds Abdominal:     General: Bowel sounds are normal. There is no distension.     Palpations: Abdomen is soft. There is no mass.     Tenderness: There is no abdominal tenderness. There is no guarding or rebound.  Musculoskeletal:        General: No tenderness. Normal range of motion.     Cervical back: Normal range of motion and neck supple.  Skin:    General: Skin is warm.  Neurological:     Mental Status: He is alert and oriented to person, place, and time.  Psychiatric:        Mood and Affect: Affect normal.      LABORATORY DATA:  I have reviewed the data as listed Lab Results  Component Value Date   WBC 9.2 01/21/2021   HGB 13.2 01/21/2021   HCT 39.5 01/21/2021   MCV 99.0 01/21/2021   PLT 224 01/21/2021   Recent Labs    07/23/20 0829 08/06/20 0852 08/20/20 0821 09/03/20 1033 11/11/20 0848 11/25/20 0859 12/10/20 1030  NA 141 139 138   < > 140 141 139  K 3.5 4.8 4.2   < > 3.6 3.1* 4.5  CL 109 104 107   < > 105 108 106  CO2 '23 28 25   ' < > '28 25 28  ' GLUCOSE 137* 103* 101*   < > 88 95 106*  BUN '12 14 14   ' < > '15 12 17  ' CREATININE 0.81 0.84 0.92   < > 0.87 0.82 0.88  CALCIUM 8.2* 8.8* 8.8*   < > 9.0 8.4* 9.3  GFRNONAA >60 >60 >60   < > >60 >60 >60  GFRAA >60 >60 >60  --   --   --   --   PROT 6.4* 7.2 6.7   < > 6.6 6.0* 7.0  ALBUMIN 2.9* 3.3* 3.4*   < > 3.6 3.3* 3.8  AST 19 39 26   < > '19 18 23  ' ALT 22 35 22   < > '16 13 18  ' ALKPHOS 64 95 59   < > 46 46 49  BILITOT 1.0 1.0 0.9   < > 1.8* 2.0* 1.4*   < > = values in this interval not displayed.    RADIOGRAPHIC STUDIES: I have personally reviewed the radiological images as listed and agreed with the findings in the report. NM PET Image Restag (PS) Skull Base To Thigh  Result Date: 01/17/2021 CLINICAL DATA:  Subsequent treatment strategy for colon cancer. EXAM: NUCLEAR MEDICINE PET SKULL BASE TO THIGH TECHNIQUE: 13.4 mCi F-18 FDG was injected  intravenously. Full-ring PET imaging was performed from the skull base  to thigh after the radiotracer. CT data was obtained and used for attenuation correction and anatomic localization. Fasting blood glucose: 88 mg/dl COMPARISON:  CT chest abdomen pelvis dated 10/21/2020. PET-CT dated 06/11/2020. FINDINGS: Mediastinal blood pool activity: SUV max 2.8 Liver activity: SUV max NA NECK: No hypermetabolic cervical lymphadenopathy. Incidental CT findings: none CHEST: No suspicious pulmonary nodules. Very mild subpleural reticulation in the bilateral upper lobes with reticulation/dependent atelectasis in the bilateral lower lobes, possibly post infectious/inflammatory fibrosis related to prior COVID. No hypermetabolic thoracic lymphadenopathy. Left chest port terminates in the lower SVC. Incidental CT findings: Atherosclerotic calcifications of the aortic arch. Mild coronary atherosclerosis of the LAD. ABDOMEN/PELVIS: Status post right hemicolectomy. No hypermetabolic abdominopelvic lymphadenopathy. No abnormal hypermetabolism in the liver, spleen, pancreas, or adrenal glands. Incidental CT findings: Status post cholecystectomy. Mildly prominent spleen. Atherosclerotic calcifications the abdominal aorta and branch vessels. Prostatomegaly. SKELETON: No focal hypermetabolic activity to suggest skeletal metastasis. Incidental CT findings: Mild degenerative changes of the visualized thoracolumbar spine. IMPRESSION: Status post right hemicolectomy. No evidence of recurrent or metastatic disease. Additional ancillary findings as above. Electronically Signed   By: Julian Hy M.D.   On: 01/17/2021 11:18    ASSESSMENT & PLAN:   Cancer of right colon ([facility]) #Colon cancer stage III-PT4BN2B- high risk. S/p  adjuvant CIV 5FU x12 cycles. PET 25th, 2022-. Status post right hemicolectomy; No evidence of recurrent or metastatic disease.ELEVATED CEA- 15-17; monitor for now.  Recommend imaging in 3 to 4 months.  Order at next visit.  # minimal GOO in Bil Lungs- ? Post COVID- on PET-improved  #   Enlarged prostate [incidental on CT]- PSA- WNL. STABLE   # COPD- STABLE  continue inhalers.   # DISPOSITION: # follow up in2 months-  MD; labs- cbc/cmp;CEA; port flush-;Dr.B  # I reviewed the blood work- with the patient in detail; also reviewed the imaging independently [as summarized above]; and with the patient in detail.      All questions were answered. The patient knows to call the clinic with any problems, questions or concerns.    Cammie Sickle, MD 01/21/2021 10:32 AM

## 2021-01-21 NOTE — Assessment & Plan Note (Addendum)
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 S/p  adjuvant CIV 5FU x12 cycles. PET 25th, 2022-. Status post right hemicolectomy; No evidence of recurrent or metastatic disease.ELEVATED CEA- 15-17; monitor for now.  Recommend imaging in 3 to 4 months.  Order at next visit.  # minimal GOO in Bil Lungs- ? Post COVID- on PET-improved  #  Enlarged prostate [incidental on CT]- PSA- WNL. STABLE   # COPD- STABLE  continue inhalers.   # DISPOSITION: # follow up in2 months-  MD; labs- cbc/cmp;CEA; port flush-;Dr.B  # I reviewed the blood work- with the patient in detail; also reviewed the imaging independently [as summarized above]; and with the patient in detail.

## 2021-01-22 LAB — CEA: CEA: 7.9 ng/mL — ABNORMAL HIGH (ref 0.0–4.7)

## 2021-03-25 ENCOUNTER — Inpatient Hospital Stay (HOSPITAL_BASED_OUTPATIENT_CLINIC_OR_DEPARTMENT_OTHER): Payer: Medicare HMO | Admitting: Internal Medicine

## 2021-03-25 ENCOUNTER — Encounter: Payer: Self-pay | Admitting: Internal Medicine

## 2021-03-25 ENCOUNTER — Other Ambulatory Visit: Payer: Self-pay

## 2021-03-25 ENCOUNTER — Inpatient Hospital Stay: Payer: Medicare HMO | Attending: Internal Medicine

## 2021-03-25 VITALS — BP 147/67 | HR 56 | Temp 98.8°F | Resp 16 | Ht 75.0 in | Wt 256.0 lb

## 2021-03-25 DIAGNOSIS — J449 Chronic obstructive pulmonary disease, unspecified: Secondary | ICD-10-CM | POA: Diagnosis not present

## 2021-03-25 DIAGNOSIS — C182 Malignant neoplasm of ascending colon: Secondary | ICD-10-CM | POA: Insufficient documentation

## 2021-03-25 DIAGNOSIS — R197 Diarrhea, unspecified: Secondary | ICD-10-CM | POA: Diagnosis not present

## 2021-03-25 DIAGNOSIS — Z8616 Personal history of COVID-19: Secondary | ICD-10-CM | POA: Diagnosis not present

## 2021-03-25 DIAGNOSIS — Z95828 Presence of other vascular implants and grafts: Secondary | ICD-10-CM

## 2021-03-25 DIAGNOSIS — Z452 Encounter for adjustment and management of vascular access device: Secondary | ICD-10-CM | POA: Insufficient documentation

## 2021-03-25 LAB — CBC WITH DIFFERENTIAL/PLATELET
Abs Immature Granulocytes: 0.03 10*3/uL (ref 0.00–0.07)
Basophils Absolute: 0 10*3/uL (ref 0.0–0.1)
Basophils Relative: 0 %
Eosinophils Absolute: 0.4 10*3/uL (ref 0.0–0.5)
Eosinophils Relative: 4 %
HCT: 42.9 % (ref 39.0–52.0)
Hemoglobin: 14.4 g/dL (ref 13.0–17.0)
Immature Granulocytes: 0 %
Lymphocytes Relative: 26 %
Lymphs Abs: 2.7 10*3/uL (ref 0.7–4.0)
MCH: 31.8 pg (ref 26.0–34.0)
MCHC: 33.6 g/dL (ref 30.0–36.0)
MCV: 94.7 fL (ref 80.0–100.0)
Monocytes Absolute: 0.9 10*3/uL (ref 0.1–1.0)
Monocytes Relative: 9 %
Neutro Abs: 6.3 10*3/uL (ref 1.7–7.7)
Neutrophils Relative %: 61 %
Platelets: 224 10*3/uL (ref 150–400)
RBC: 4.53 MIL/uL (ref 4.22–5.81)
RDW: 12.4 % (ref 11.5–15.5)
WBC: 10.3 10*3/uL (ref 4.0–10.5)
nRBC: 0 % (ref 0.0–0.2)

## 2021-03-25 LAB — COMPREHENSIVE METABOLIC PANEL
ALT: 21 U/L (ref 0–44)
AST: 23 U/L (ref 15–41)
Albumin: 3.7 g/dL (ref 3.5–5.0)
Alkaline Phosphatase: 46 U/L (ref 38–126)
Anion gap: 11 (ref 5–15)
BUN: 14 mg/dL (ref 8–23)
CO2: 27 mmol/L (ref 22–32)
Calcium: 8.9 mg/dL (ref 8.9–10.3)
Chloride: 102 mmol/L (ref 98–111)
Creatinine, Ser: 0.79 mg/dL (ref 0.61–1.24)
GFR, Estimated: >60 mL/min (ref >60)
Glucose, Bld: 92 mg/dL (ref 70–99)
Potassium: 3.7 mmol/L (ref 3.5–5.1)
Sodium: 140 mmol/L (ref 135–145)
Total Bilirubin: 2.1 mg/dL — ABNORMAL HIGH (ref 0.3–1.2)
Total Protein: 6.8 g/dL (ref 6.5–8.1)

## 2021-03-25 MED ORDER — SODIUM CHLORIDE 0.9% FLUSH
10.0000 mL | Freq: Once | INTRAVENOUS | Status: AC
  Administered 2021-03-25: 10 mL via INTRAVENOUS
  Filled 2021-03-25: qty 10

## 2021-03-25 MED ORDER — HEPARIN SOD (PORK) LOCK FLUSH 100 UNIT/ML IV SOLN
500.0000 [IU] | Freq: Once | INTRAVENOUS | Status: AC
  Administered 2021-03-25: 500 [IU] via INTRAVENOUS
  Filled 2021-03-25: qty 5

## 2021-03-25 NOTE — Progress Notes (Signed)
[facility] NOTE  Patient Care Team: Albina Billet, MD as PCP - General (Internal Medicine) Clent Jacks, RN as Oncology Nurse Navigator Cammie Sickle, MD as Consulting Physician (Hematology and Oncology)  CHIEF COMPLAINTS/PURPOSE OF CONSULTATION:  Colon cancer  #  Oncology History Overview Note  # July 2021- RIGHT COLON ADENO CARCINOMA- Dr.Byrnett s/p right hemicolectomy- pT4b N2b- STAGE III [high risk]; MSI-STABLE.   # AUG 10th, 2021- FOLFOX [adjuvant]  -------------------------------------------------------------------------------------------   - INVASIVE MODERATELY DIFFERENTIATED ADENOCARCINOMA.  - TEN OF FIFTY-TWO LYMPH NODES POSITIVE FOR METASTATIC ADENOCARCINOMA  (10/52).  - SINGLE INCIDENTAL TUBULOVILLOUS ADENOMA.  - THREE INCIDENTAL TUBULAR ADENOMAS.  - SEE CANCER SUMMARY.   CANCER CASE SUMMARY: COLON AND RECTUM  Procedure: Right hemicolectomy  Tumor Site: Cecum  Tumor Size: Greatest dimension: 12.2 cm  Macroscopic Tumor Perforation: Present  Histologic Type: Adenocarcinoma  Histologic Grade: G2: Moderately differentiated  Tumor Extension: Tumor directly invades adjacent structures (terminal  ileum)  Margins: All margins are uninvolved by invasive carcinoma, high-grade  dysplasia/intramucosal adenocarcinoma, and low-grade dysplasia  Treatment Effect: No known presurgical therapy  Lymphovascular Invasion: Present  Perineural Invasion: Present  Tumor Deposits: Present: Multiple  Regional Lymph Nodes:       Number of Lymph Nodes Involved: 10       Number of Lymph Nodes Examined: 81  -  # COPD- [Dr.Fleming];   # SURVIVORSHIP:   # GENETICS:   DIAGNOSIS: colon cancer  STAGE:   III      ;  GOALS:cure  CURRENT/MOST RECENT THERAPY : FOLFOX     Cancer of right colon ([place])  05/20/2020 Initial Diagnosis   Cancer of right colon ([place])   07/02/2020 -  Chemotherapy    Patient is on Treatment Plan: COLORECTAL 5FU Q14D X 6  MONTHS      12/08/2020 Cancer Staging   Staging form: Colon and Rectum, AJCC 8th Edition - Clinical: Stage IIIC (cT4b, cN2b, cM0) - Signed by Cammie Sickle, MD on 12/08/2020      HISTORY OF PRESENTING ILLNESS:  James Hodge 75 y.o.  male high risk stage III colon cancer currently on adjuvant 5-FU continuous infusion is here for follow-up.  Patient had mild diarrhea few weeks ago.  Currently resolved.  No nausea vomiting.  Shortness of breath is chronic not any worse.  Denies any worsening back pain.  Denies any headaches.  Review of Systems  Constitutional: Negative for chills, diaphoresis, fever and malaise/fatigue.  HENT: Negative for nosebleeds and sore throat.   Eyes: Negative for double vision.  Respiratory: Positive for shortness of breath. Negative for cough, hemoptysis, sputum production and wheezing.   Cardiovascular: Negative for chest pain, palpitations and orthopnea.  Gastrointestinal: Negative for abdominal pain, blood in stool, constipation, diarrhea, heartburn, melena, nausea and vomiting.  Genitourinary: Negative for dysuria, frequency and urgency.  Musculoskeletal: Negative for back pain and joint pain.  Skin: Negative.  Negative for itching and rash.  Neurological: Negative for dizziness, tingling, focal weakness, weakness and headaches.  Endo/Heme/Allergies: Does not bruise/bleed easily.  Psychiatric/Behavioral: Negative for depression. The patient is not nervous/anxious and does not have insomnia.      MEDICAL HISTORY:  Past Medical History:  Diagnosis Date  . Cancer ([place])   . COPD (chronic obstructive pulmonary disease) ([place])   . Hypertension     SURGICAL HISTORY: Past Surgical History:  Procedure Laterality Date  . CHOLECYSTECTOMY     Around 2001  . COLONOSCOPY WITH PROPOFOL N/A 04/12/2020  Procedure: COLONOSCOPY WITH PROPOFOL;  Surgeon: Robert Bellow, MD;  Location: [facility] ENDOSCOPY;  Service: Endoscopy;  Laterality: N/A;  . COLONOSCOPY  WITH PROPOFOL N/A 04/24/2020   Procedure: COLONOSCOPY WITH PROPOFOL;  Surgeon: Robert Bellow, MD;  Location: [facility] ENDOSCOPY;  Service: Endoscopy;  Laterality: N/A;  . ESOPHAGOGASTRODUODENOSCOPY (EGD) WITH PROPOFOL N/A 04/12/2020   Procedure: ESOPHAGOGASTRODUODENOSCOPY (EGD) WITH PROPOFOL;  Surgeon: Robert Bellow, MD;  Location: [facility] ENDOSCOPY;  Service: Endoscopy;  Laterality: N/A;  . EYE SURGERY Bilateral    cataract  . LAPAROSCOPIC PARTIAL COLECTOMY Right 05/20/2020   Procedure: LAPAROSCOPIC PARTIAL COLECTOMY;  Surgeon: Robert Bellow, MD;  Location: [facility] ORS;  Service: General;  Laterality: Right;  Floyce Stakes, RNFA to assist  . PORTACATH PLACEMENT Left 06/19/2020   Procedure: INSERTION PORT-A-CATH;  Surgeon: Robert Bellow, MD;  Location: [facility] ORS;  Service: General;  Laterality: Left;    SOCIAL HISTORY: Social History   Socioeconomic History  . Marital status: Married    Spouse name: Not on file  . Number of children: Not on file  . Years of education: Not on file  . Highest education level: Not on file  Occupational History  . Not on file  Tobacco Use  . Smoking status: Former Smoker    Years: 3.00    Types: Cigarettes    Quit date: 11/23/1968    Years since quitting: 52.3  . Smokeless tobacco: Never Used  Vaping Use  . Vaping Use: Never used  Substance and Sexual Activity  . Alcohol use: Not Currently  . Drug use: Never  . Sexual activity: Not on file  Other Topics Concern  . Not on file  Social History Narrative   Lives in [place]; smoked- quit in 70s; beer social; 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; grandson    Social Determinants of Health   Financial Resource Strain: Not on file  Food Insecurity: Not on file  Transportation Needs: Not on file  Physical Activity: Not on file  Stress: Not on file  Social Connections: Not on file  Intimate Partner Violence: Not on file    FAMILY HISTORY: Family History  Problem Relation Age of Onset  . Multiple  sclerosis Brother     ALLERGIES:  has No Known Allergies.  MEDICATIONS:  Current Outpatient Medications  Medication Sig Dispense Refill  . BREO ELLIPTA 100-25 MCG/INH AEPB Inhale 1 puff into the lungs at bedtime.    . lidocaine-prilocaine (EMLA) cream Apply 1 application topically as needed. 30 g 3  . lisinopril (ZESTRIL) 10 MG tablet     . ondansetron (ZOFRAN) 8 MG tablet One pill every 8 hours as needed for nausea/vomitting. (Patient not taking: No sig reported) 40 tablet 1  . potassium chloride SA (KLOR-CON M20) 20 MEQ tablet TAKE 1 TABLET BY MOUTH TWICE A DAY 60 tablet 3  . predniSONE (DELTASONE) 20 MG tablet Take 3 pills once a day x 3 days; 2 pills/day for 3 days; and the once a day. Take with Breakfast. 30 tablet 0  . prochlorperazine (COMPAZINE) 10 MG tablet Take 1 tablet (10 mg total) by mouth every 6 (six) hours as needed for nausea or vomiting. (Patient not taking: No sig reported) 40 tablet 1  . tiotropium (SPIRIVA) 18 MCG inhalation capsule Place 18 mcg into inhaler and inhale at bedtime.      No current facility-administered medications for this visit.   Facility-Administered Medications Ordered in Other Visits  Medication Dose Route Frequency Provider Last Rate Last Admin  .  heparin lock flush 100 unit/mL  500 Units Intravenous Once Charlaine Dalton R, MD          .  PHYSICAL EXAMINATION: ECOG PERFORMANCE STATUS: 0 - Asymptomatic  Vitals:   03/25/21 1019  BP: (!) 147/67  Pulse: (!) 56  Resp: 16  Temp: 98.8 F (37.1 C)  SpO2: 96%   Filed Weights   03/25/21 1019  Weight: 256 lb (116.1 kg)    Physical Exam HENT:     Head: Normocephalic and atraumatic.     Mouth/Throat:     Pharynx: No oropharyngeal exudate.  Eyes:     Pupils: Pupils are equal, round, and reactive to light.  Cardiovascular:     Rate and Rhythm: Normal rate and regular rhythm.  Pulmonary:     Effort: No respiratory distress.     Breath sounds: No wheezing.     Comments: Decreased  breath sounds Abdominal:     General: Bowel sounds are normal. There is no distension.     Palpations: Abdomen is soft. There is no mass.     Tenderness: There is no abdominal tenderness. There is no guarding or rebound.  Musculoskeletal:        General: No tenderness. Normal range of motion.     Cervical back: Normal range of motion and neck supple.  Skin:    General: Skin is warm.  Neurological:     Mental Status: He is alert and oriented to person, place, and time.  Psychiatric:        Mood and Affect: Affect normal.      LABORATORY DATA:  I have reviewed the data as listed Lab Results  Component Value Date   WBC 10.3 03/25/2021   HGB 14.4 03/25/2021   HCT 42.9 03/25/2021   MCV 94.7 03/25/2021   PLT 224 03/25/2021   Recent Labs    07/23/20 0829 08/06/20 0852 08/20/20 0821 09/03/20 1033 12/10/20 1030 01/21/21 0958 03/25/21 1007  NA 141 139 138   < > 139 143 140  K 3.5 4.8 4.2   < > 4.5 3.6 3.7  CL 109 104 107   < > 106 106 102  CO2 _0 < > _1 GLUCOSE 137* 103* 101*   < > 106* 82 92  BUN _2 < > _3 CREATININE 0.81 0.84 0.92   < > 0.88 0.67 0.79  CALCIUM 8.2* 8.8* 8.8*   < > 9.3 8.5* 8.9  GFRNONAA >60 >60 >60   < > >60 >60 >60  GFRAA >60 >60 >60  --   --   --   --   PROT 6.4* 7.2 6.7   < > 7.0 6.3* 6.8  ALBUMIN 2.9* 3.3* 3.4*   < > 3.8 3.4* 3.7  AST 19 39 26   < > _4 ALT 22 35 22   < > _5 ALKPHOS 64 95 59   < > 49 45 46  BILITOT 1.0 1.0 0.9   < > 1.4* 1.9* 2.1*   < > = values in this interval not displayed.    RADIOGRAPHIC STUDIES: I have personally reviewed the radiological images as listed and agreed with the findings in the report. No results found.  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 S/p  adjuvant CIV 5FU x12 cycles [finished Jan 18th, 2022]. PET 25th, 2022-. Status  post right hemicolectomy; No evidence of recurrent or metastatic disease.ELEVATED CEA-  15-17; monitor for now.  Will order CT scan for end of June 2022.  # diarrhea- intermittent-unclear etiology.  Monitor for now.  If worse would recommend further work-up including stool studies/C. difficile.  Do not expect from chemotherapy.  # minimal GOO in Bil Lungs- ? Post COVID- on PET-improved; stable.  Await repeat CT scan.  #  Enlarged prostate [incidental on CT]- PSA- WNL. STABLE   # COPD- STABLE.   continue inhalers.   # DISPOSITION: # follow up in last week of June 2022;  MD; labs- cbc/cmp;CEA; port flush; CT scan C/A/P prior--;Dr.B     All questions were answered. The patient knows to call the clinic with any problems, questions or concerns.    Cammie Sickle, MD 03/25/2021 11:32 AM

## 2021-03-25 NOTE — Progress Notes (Signed)
Has had diarrhea for 2-3 weeks.

## 2021-03-25 NOTE — Assessment & Plan Note (Addendum)
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 S/p  adjuvant CIV 5FU x12 cycles [finished Jan 18th, 2022]. PET 25th, 2022-. Status post right hemicolectomy; No evidence of recurrent or metastatic disease.ELEVATED CEA- 15-17; monitor for now.  Will order CT scan for end of June 2022.  # diarrhea- intermittent-unclear etiology.  Monitor for now.  If worse would recommend further work-up including stool studies/C. difficile.  Do not expect from chemotherapy.  # minimal GOO in Bil Lungs- ? Post COVID- on PET-improved; stable.  Await repeat CT scan.  #  Enlarged prostate [incidental on CT]- PSA- WNL. STABLE   # COPD- STABLE.   continue inhalers.   # DISPOSITION: # follow up in last week of June 2022;  MD; labs- cbc/cmp;CEA; port flush; CT scan C/A/P prior--;Dr.B

## 2021-03-26 LAB — CEA: CEA: 8.4 ng/mL — ABNORMAL HIGH (ref 0.0–4.7)

## 2021-04-29 ENCOUNTER — Other Ambulatory Visit: Payer: Self-pay | Admitting: Internal Medicine

## 2021-04-30 ENCOUNTER — Other Ambulatory Visit: Payer: Self-pay | Admitting: Internal Medicine

## 2021-05-20 ENCOUNTER — Other Ambulatory Visit: Payer: Self-pay

## 2021-05-20 ENCOUNTER — Ambulatory Visit
Admission: RE | Admit: 2021-05-20 | Discharge: 2021-05-20 | Disposition: A | Discharge status: Home / Self Care | Payer: Medicare HMO | Source: Ambulatory Visit | Attending: Internal Medicine | Admitting: Internal Medicine

## 2021-05-20 DIAGNOSIS — C182 Malignant neoplasm of ascending colon: Secondary | ICD-10-CM | POA: Diagnosis not present

## 2021-05-20 LAB — POCT I-STAT CREATININE: Creatinine, Ser: 0.9 mg/dL (ref 0.61–1.24)

## 2021-05-20 MED ORDER — IOHEXOL 300 MG/ML  SOLN
100.0000 mL | Freq: Once | INTRAMUSCULAR | Status: AC | PRN
  Administered 2021-05-20: 100 mL via INTRAVENOUS

## 2021-05-21 ENCOUNTER — Inpatient Hospital Stay (HOSPITAL_BASED_OUTPATIENT_CLINIC_OR_DEPARTMENT_OTHER): Payer: Medicare HMO | Admitting: Internal Medicine

## 2021-05-21 ENCOUNTER — Inpatient Hospital Stay: Payer: Medicare HMO | Attending: Internal Medicine

## 2021-05-21 ENCOUNTER — Other Ambulatory Visit: Payer: Self-pay | Admitting: *Deleted

## 2021-05-21 DIAGNOSIS — Z452 Encounter for adjustment and management of vascular access device: Secondary | ICD-10-CM | POA: Insufficient documentation

## 2021-05-21 DIAGNOSIS — C182 Malignant neoplasm of ascending colon: Secondary | ICD-10-CM | POA: Insufficient documentation

## 2021-05-21 DIAGNOSIS — Z8616 Personal history of COVID-19: Secondary | ICD-10-CM | POA: Diagnosis not present

## 2021-05-21 DIAGNOSIS — I10 Essential (primary) hypertension: Secondary | ICD-10-CM | POA: Insufficient documentation

## 2021-05-21 DIAGNOSIS — J449 Chronic obstructive pulmonary disease, unspecified: Secondary | ICD-10-CM | POA: Diagnosis not present

## 2021-05-21 DIAGNOSIS — Z95828 Presence of other vascular implants and grafts: Secondary | ICD-10-CM

## 2021-05-21 LAB — CBC WITH DIFFERENTIAL/PLATELET
Abs Immature Granulocytes: 0.04 10*3/uL (ref 0.00–0.07)
Basophils Absolute: 0.1 10*3/uL (ref 0.0–0.1)
Basophils Relative: 1 %
Eosinophils Absolute: 0.4 10*3/uL (ref 0.0–0.5)
Eosinophils Relative: 4 %
HCT: 40.3 % (ref 39.0–52.0)
Hemoglobin: 13.5 g/dL (ref 13.0–17.0)
Immature Granulocytes: 0 %
Lymphocytes Relative: 24 %
Lymphs Abs: 2.4 10*3/uL (ref 0.7–4.0)
MCH: 31.8 pg (ref 26.0–34.0)
MCHC: 33.5 g/dL (ref 30.0–36.0)
MCV: 94.8 fL (ref 80.0–100.0)
Monocytes Absolute: 1 10*3/uL (ref 0.1–1.0)
Monocytes Relative: 10 %
Neutro Abs: 6 10*3/uL (ref 1.7–7.7)
Neutrophils Relative %: 61 %
Platelets: 224 10*3/uL (ref 150–400)
RBC: 4.25 MIL/uL (ref 4.22–5.81)
RDW: 13.4 % (ref 11.5–15.5)
WBC: 9.7 10*3/uL (ref 4.0–10.5)
nRBC: 0 % (ref 0.0–0.2)

## 2021-05-21 LAB — COMPREHENSIVE METABOLIC PANEL
ALT: 16 U/L (ref 0–44)
AST: 20 U/L (ref 15–41)
Albumin: 3.5 g/dL (ref 3.5–5.0)
Alkaline Phosphatase: 51 U/L (ref 38–126)
Anion gap: 5 (ref 5–15)
BUN: 12 mg/dL (ref 8–23)
CO2: 29 mmol/L (ref 22–32)
Calcium: 8.7 mg/dL — ABNORMAL LOW (ref 8.9–10.3)
Chloride: 105 mmol/L (ref 98–111)
Creatinine, Ser: 0.97 mg/dL (ref 0.61–1.24)
GFR, Estimated: >60 mL/min (ref >60)
Glucose, Bld: 103 mg/dL — ABNORMAL HIGH (ref 70–99)
Potassium: 3.5 mmol/L (ref 3.5–5.1)
Sodium: 139 mmol/L (ref 135–145)
Total Bilirubin: 2.2 mg/dL — ABNORMAL HIGH (ref 0.3–1.2)
Total Protein: 6.6 g/dL (ref 6.5–8.1)

## 2021-05-21 MED ORDER — HEPARIN SOD (PORK) LOCK FLUSH 100 UNIT/ML IV SOLN
INTRAVENOUS | Status: AC
  Filled 2021-05-21: qty 5

## 2021-05-21 MED ORDER — HEPARIN SOD (PORK) LOCK FLUSH 100 UNIT/ML IV SOLN
500.0000 [IU] | Freq: Once | INTRAVENOUS | Status: AC
  Administered 2021-05-21: 500 [IU] via INTRAVENOUS
  Filled 2021-05-21: qty 5

## 2021-05-21 MED ORDER — SODIUM CHLORIDE 0.9% FLUSH
10.0000 mL | Freq: Once | INTRAVENOUS | Status: AC
  Administered 2021-05-21: 10 mL via INTRAVENOUS
  Filled 2021-05-21: qty 10

## 2021-05-21 NOTE — Assessment & Plan Note (Signed)
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 S/p  adjuvant CIV 5FU x12 cycles [finished Jan 18th, 2022]. June 28th, 2022- No evidence of thoracic metastasis;  No colorectal cancer recurrence or metastasis in the abdomen pelvis. Await on surveillaince colonocsopy.   # minimal GOO in Bil Lungs- ? Post COVID; JUne 2022- CT-resolved.   #  Enlarged prostate [incidental on CT]- PSA- WNL. STABLE.   # COPD-STABLE-   continue inhalers.   # Port flush- q 70M.   # DISPOSITION: # follow up in 3 month 2022;  MD; labs- cbc/cmp;CEA; port flush;-;Dr.B  # I reviewed the blood work- with the patient in detail; also reviewed the imaging independently [as summarized above]; and with the patient in detail.

## 2021-05-21 NOTE — Patient Instructions (Signed)
[facility] ONCOL[facility]  Discharge Instructions: Thank you for choosing [facility] to provide your oncology and hematology care.  If you have a lab appointment with the [facility], please go directly to the [facility] and check in at the registration area.  Wear comfortable clothing and clothing appropriate for easy access to any Portacath or PICC line.   We strive to give you quality time with your provider. You may need to reschedule your appointment if you arrive late (15 or more minutes).  Arriving late affects you and other patients whose appointments are after yours.  Also, if you miss three or more appointments without notifying the office, you may be dismissed from the clinic at the provider's discretion.      For prescription refill requests, have your pharmacy contact our office and allow 72 hours for refills to be completed.    Today you received the following chemotherapy and/or immunotherapy agents PAC        To help prevent nausea and vomiting after your treatment, we encourage you to take your nausea medication as directed.  BELOW ARE SYMPTOMS THAT SHOULD BE REPORTED IMMEDIATELY: *FEVER GREATER THAN 100.4 F (38 C) OR HIGHER *CHILLS OR SWEATING *NAUSEA AND VOMITING THAT IS NOT CONTROLLED WITH YOUR NAUSEA MEDICATION *UNUSUAL SHORTNESS OF BREATH *UNUSUAL BRUISING OR BLEEDING *URINARY PROBLEMS (pain or burning when urinating, or frequent urination) *BOWEL PROBLEMS (unusual diarrhea, constipation, pain near the anus) TENDERNESS IN MOUTH AND THROAT WITH OR WITHOUT PRESENCE OF ULCERS (sore throat, sores in mouth, or a toothache) UNUSUAL RASH, SWELLING OR PAIN  UNUSUAL VAGINAL DISCHARGE OR ITCHING   Items with * indicate a potential emergency and should be followed up as soon as possible or go to the Emergency Department if any problems should occur.  Please show the CHEMOTHERAPY ALERT CARD or IMMUNOTHERAPY ALERT CARD at check-in to  the Emergency Department and triage nurse.  Should you have questions after your visit or need to cancel or reschedule your appointment, please contact [facility]  [phone] and follow the prompts.  Office hours are 8:00 a.m. to 4:30 p.m. Monday - Friday. Please note that voicemails left after 4:00 p.m. may not be returned until the following business day.  We are closed weekends and major holidays. You have access to a nurse at all times for urgent questions. Please call the main number to the clinic [phone] and follow the prompts.  For any non-urgent questions, you may also contact your provider using MyChart. We now offer e-Visits for anyone 67 and older to request care online for non-urgent symptoms. For details visit [email or web address].   Also download the MyChart app! Go to the app store, search "MyChart", open the app, select [facility], and log in with your MyChart username and password.  Due to Covid, a mask is required upon entering the hospital/clinic. If you do not have a mask, one will be given to you upon arrival. For doctor visits, patients may have 1 support person aged 14 or older with them. For treatment visits, patients cannot have anyone with them due to current Covid guidelines and our immunocompromised population.   Implanted Port Insertion Implanted port insertion is a procedure to put in a port and catheter. The port is a device with an injectable disk that can be accessed by your health care provider. The port is connected to a vein in the chest or neck by a small flexible tube (catheter). There  are different types of ports. The implanted port may be used as a long-term IV access for: Medicines, such as chemotherapy. Fluids. Liquid nutrition, such as total parenteral nutrition (TPN). When you have a port, your health care provider can choose to use the portinstead of veins in your arms for these procedures. Tell a health care  provider about: Any allergies you have. All medicines you are taking, especially blood thinners, as well as any vitamins, herbs, eye drops, creams, over-the-counter medicines, and steroids. Any problems you or family members have had with anesthetic medicines. Any blood disorders you have. Any surgeries you have had. Any medical conditions you have or have had, including diabetes or kidney problems. Whether you are pregnant or may be pregnant. What are the risks? Generally, this is a safe procedure. However, problems may occur, including: Allergic reactions to medicines or dyes. Damage to other structures or organs. Infection. Damage to the blood vessel, bruising, or bleeding at the puncture site. Blood clot. Breakdown of the skin over the port. A collection of air in the chest that can cause one of the lungs to collapse (pneumothorax). This is rare. What happens before the procedure? Staying hydrated Follow instructions from your health care provider about hydration, which may include: Up to 2 hours before the procedure - you may continue to drink clear liquids, such as water, clear fruit juice, black coffee, and plain tea.  Eating and drinking restrictions Follow instructions from your health care provider about eating and drinking, which may include: 8 hours before the procedure - stop eating heavy meals or foods, such as meat, fried foods, or fatty foods. 6 hours before the procedure - stop eating light meals or foods, such as toast or cereal. 6 hours before the procedure - stop drinking milk or drinks that contain milk. 2 hours before the procedure - stop drinking clear liquids. Medicines Ask your health care provider about: Changing or stopping your regular medicines. This is especially important if you are taking diabetes medicines or blood thinners. Taking medicines such as aspirin and ibuprofen. These medicines can thin your blood. Do not take these medicines unless your  health care provider tells you to take them. Taking over-the-counter medicines, vitamins, herbs, and supplements. General instructions Plan to have someone take you home from the hospital or clinic. If you will be going home right after the procedure, plan to have someone with you for 24 hours. You may have blood tests. Do not use any products that contain nicotine or tobacco for at least 4-6 weeks before the procedure. These products include cigarettes, e-cigarettes, and chewing tobacco. If you need help quitting, ask your health care provider. Ask your health care provider what steps will be taken to help prevent infection. These may include: Removing hair at the surgery site. Washing skin with a germ-killing soap. Taking antibiotic medicine. What happens during the procedure?  An IV will be inserted into one of your veins. You will be given one or more of the following: A medicine to help you relax (sedative). A medicine to numb the area (local anesthetic). Two small incisions will be made to insert the port. One smaller incision will be made in your neck to get access to the vein where the catheter will lie. The other incision will be made in the upper chest. This is where the port will lie. The procedure may be done using continuous X-ray (fluoroscopy) or other imaging tools for guidance. The port and catheter will be placed. There  may be a small, raised area where the port is. The port will be flushed with a salt solution (saline), and blood will be drawn to make sure that it is working correctly. The incisions will be closed. Bandages (dressings) may be placed over the incisions. The procedure may vary among health care providers and hospitals. What happens after the procedure? Your blood pressure, heart rate, breathing rate, and blood oxygen level will be monitored until you leave the hospital or clinic. Do not drive for 24 hours if you were given a sedative during your  procedure. You will be given a manufacturer's information card for the type of port that you have. Keep this with you. Your port will need to be flushed and checked as told by your health care provider, usually every few weeks. A chest X-ray will be done to: Check the placement of the port. Make sure there is no injury to your lung. Summary Implanted port insertion is a procedure to put in a port and catheter. The implanted port is used as a long-term IV access. The port will need to be flushed and checked as told by your health care provider, usually every few weeks. Keep your manufacturer's information card with you at all times. This information is not intended to replace advice given to you by your health care provider. Make sure you discuss any questions you have with your healthcare provider. Document Revised: 12/21/2019 Document Reviewed: 06/07/2018 Elsevier Patient Education  [place].

## 2021-05-21 NOTE — Progress Notes (Signed)
[place] NOTE  Patient Care Team: Albina Billet, MD as PCP - General (Internal Medicine) Clent Jacks, RN as Oncology Nurse Navigator Cammie Sickle, MD as Consulting Physician (Hematology and Oncology)  CHIEF COMPLAINTS/PURPOSE OF CONSULTATION:  Colon cancer  #  Oncology History Overview Note  # July 2021- RIGHT COLON ADENO CARCINOMA- Dr.Byrnett s/p right hemicolectomy- pT4b N2b- STAGE III [high risk]; MSI-STABLE.   # AUG 10th, 2021- FOLFOX [adjuvant]  -------------------------------------------------------------------------------------------   - INVASIVE MODERATELY DIFFERENTIATED ADENOCARCINOMA.  - TEN OF FIFTY-TWO LYMPH NODES POSITIVE FOR METASTATIC ADENOCARCINOMA  (10/52).  - SINGLE INCIDENTAL TUBULOVILLOUS ADENOMA.  - THREE INCIDENTAL TUBULAR ADENOMAS.  - SEE CANCER SUMMARY.   CANCER CASE SUMMARY: COLON AND RECTUM  Procedure: Right hemicolectomy  Tumor Site: Cecum  Tumor Size: Greatest dimension: 12.2 cm  Macroscopic Tumor Perforation: Present  Histologic Type: Adenocarcinoma  Histologic Grade: G2: Moderately differentiated  Tumor Extension: Tumor directly invades adjacent structures (terminal  ileum)  Margins: All margins are uninvolved by invasive carcinoma, high-grade  dysplasia/intramucosal adenocarcinoma, and low-grade dysplasia  Treatment Effect: No known presurgical therapy  Lymphovascular Invasion: Present  Perineural Invasion: Present  Tumor Deposits: Present: Multiple  Regional Lymph Nodes:       Number of Lymph Nodes Involved: 10       Number of Lymph Nodes Examined: 18  -  # COPD- [Dr.Fleming];   # SURVIVORSHIP:   # GENETICS:   DIAGNOSIS: colon cancer  STAGE:   III      ;  GOALS:cure  CURRENT/MOST RECENT THERAPY : FOLFOX     Cancer of right colon ([place])  05/20/2020 Initial Diagnosis   Cancer of right colon ([place])   07/02/2020 -  Chemotherapy    Patient is on Treatment Plan: COLORECTAL 5FU Q14D X 6  MONTHS       12/08/2020 Cancer Staging   Staging form: Colon and Rectum, AJCC 8th Edition - Clinical: Stage IIIC (cT4b, cN2b, cM0) - Signed by Cammie Sickle, MD on 12/08/2020       HISTORY OF PRESENTING ILLNESS:  James Hodge 75 y.o.  male high risk stage III colon cancer currently on adjuvant 5-FU continuous infusion is here for follow-up/review results of the CT scan.  Denies any worsening diarrhea.  Denies any new shortness of breath or cough.  Complains of chronic joint pains not any worse.  No abdominal pain.  No blood in stools or black-colored stools.  Review of Systems  Constitutional:  Negative for chills, diaphoresis, fever and malaise/fatigue.  HENT:  Negative for nosebleeds and sore throat.   Eyes:  Negative for double vision.  Respiratory:  Positive for shortness of breath. Negative for cough, hemoptysis, sputum production and wheezing.   Cardiovascular:  Negative for chest pain, palpitations and orthopnea.  Gastrointestinal:  Negative for abdominal pain, blood in stool, constipation, diarrhea, heartburn, melena, nausea and vomiting.  Genitourinary:  Negative for dysuria, frequency and urgency.  Musculoskeletal:  Negative for back pain and joint pain.  Skin: Negative.  Negative for itching and rash.  Neurological:  Negative for dizziness, tingling, focal weakness, weakness and headaches.  Endo/Heme/Allergies:  Does not bruise/bleed easily.  Psychiatric/Behavioral:  Negative for depression. The patient is not nervous/anxious and does not have insomnia.     MEDICAL HISTORY:  Past Medical History:  Diagnosis Date   Cancer ([place])    COPD (chronic obstructive pulmonary disease) ([place])    Hypertension     SURGICAL HISTORY: Past Surgical History:  Procedure Laterality  Date   CHOLECYSTECTOMY     Around 2001   COLONOSCOPY WITH PROPOFOL N/A 04/12/2020   Procedure: COLONOSCOPY WITH PROPOFOL;  Surgeon: Robert Bellow, MD;  Location: [facility] ENDOSCOPY;  Service:  Endoscopy;  Laterality: N/A;   COLONOSCOPY WITH PROPOFOL N/A 04/24/2020   Procedure: COLONOSCOPY WITH PROPOFOL;  Surgeon: Robert Bellow, MD;  Location: [facility] ENDOSCOPY;  Service: Endoscopy;  Laterality: N/A;   ESOPHAGOGASTRODUODENOSCOPY (EGD) WITH PROPOFOL N/A 04/12/2020   Procedure: ESOPHAGOGASTRODUODENOSCOPY (EGD) WITH PROPOFOL;  Surgeon: Robert Bellow, MD;  Location: [facility] ENDOSCOPY;  Service: Endoscopy;  Laterality: N/A;   EYE SURGERY Bilateral    cataract   LAPAROSCOPIC PARTIAL COLECTOMY Right 05/20/2020   Procedure: LAPAROSCOPIC PARTIAL COLECTOMY;  Surgeon: Robert Bellow, MD;  Location: [facility] ORS;  Service: General;  Laterality: Right;  Floyce Stakes, RNFA to assist   PORTACATH PLACEMENT Left 06/19/2020   Procedure: INSERTION PORT-A-CATH;  Surgeon: Robert Bellow, MD;  Location: [facility] ORS;  Service: General;  Laterality: Left;    SOCIAL HISTORY: Social History   Socioeconomic History   Marital status: Married    Spouse name: Not on file   Number of children: Not on file   Years of education: Not on file   Highest education level: Not on file  Occupational History   Not on file  Tobacco Use   Smoking status: Former    Years: 3.00    Pack years: 0.00    Types: Cigarettes    Quit date: 11/23/1968    Years since quitting: 52.5   Smokeless tobacco: Never  Vaping Use   Vaping Use: Never used  Substance and Sexual Activity   Alcohol use: Not Currently   Drug use: Never   Sexual activity: Not on file  Other Topics Concern   Not on file  Social History Narrative   Lives in [place]; smoked- quit in 70s; beer social; 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  MEDICATIONS:  Current Outpatient Medications  Medication Sig Dispense Refill   BREO ELLIPTA 100-25 MCG/INH AEPB Inhale 1 puff into the lungs at bedtime.     lidocaine-prilocaine (EMLA) cream Apply 1 application topically as needed. 30 g 3   lisinopril (ZESTRIL) 10 MG tablet      ondansetron (ZOFRAN) 8 MG tablet One pill every 8 hours as needed for nausea/vomitting. 40 tablet 1   potassium chloride SA (KLOR-CON M20) 20 MEQ tablet TAKE 1 TABLET BY MOUTH TWICE A DAY 60 tablet 3   prochlorperazine (COMPAZINE) 10 MG tablet Take 1 tablet (10 mg total) by mouth every 6 (six) hours as needed for nausea or vomiting. 40 tablet 1   tiotropium (SPIRIVA) 18 MCG inhalation capsule Place 18 mcg into inhaler and inhale at bedtime.      predniSONE (DELTASONE) 20 MG tablet Take 3 pills once a day x 3 days; 2 pills/day for 3 days; and the once a day. Take with Breakfast. (Patient not taking: Reported on 05/21/2021) 30 tablet 0   No current facility-administered medications for this visit.  Facility-Administered Medications Ordered in Other Visits  Medication Dose Route Frequency Provider Last Rate Last Admin   heparin lock flush 100 unit/mL  500 Units Intravenous Once Cammie Sickle, MD          .  PHYSICAL EXAMINATION: ECOG PERFORMANCE STATUS: 0 - Asymptomatic  Vitals:   05/21/21 1034  BP: (!) 143/83  Pulse: (!) 53  Resp: 18  Temp: 98.3 F (36.8 C)  SpO2: 97%   Filed Weights   05/21/21 1034  Weight: 264 lb (119.7 kg)    Physical Exam HENT:     Head: Normocephalic and atraumatic.     Mouth/Throat:     Pharynx: No oropharyngeal exudate.  Eyes:     Pupils: Pupils are equal, round, and reactive to light.  Cardiovascular:     Rate and Rhythm: Normal rate and regular rhythm.  Pulmonary:     Effort: No respiratory distress.     Breath sounds: No wheezing.     Comments:  Decreased breath sounds Abdominal:     General: Bowel sounds are normal. There is no distension.     Palpations: Abdomen is soft. There is no mass.     Tenderness: There is no abdominal tenderness. There is no guarding or rebound.  Musculoskeletal:        General: No tenderness. Normal range of motion.     Cervical back: Normal range of motion and neck supple.  Skin:    General: Skin is warm.  Neurological:     Mental Status: He is alert and oriented to person, place, and time.  Psychiatric:        Mood and Affect: Affect normal.     LABORATORY DATA:  I have reviewed the data as listed Lab Results  Component Value Date   WBC 9.7 05/21/2021   HGB 13.5 05/21/2021   HCT 40.3 05/21/2021   MCV 94.8 05/21/2021   PLT 224 05/21/2021   Recent Labs    07/23/20 0829 08/06/20 0852 08/20/20 0821 09/03/20 1033 01/21/21 0958 03/25/21 1007 05/20/21 1035 05/21/21 1006  NA 141 139 138   < > 143 140  --  139  K 3.5 4.8 4.2   < > 3.6 3.7  --  3.5  CL 109 104 107   < > 106 102  --  105  CO2 _0 < > 28 27  --  29  GLUCOSE 137* 103* 101*   < > 82 92  --  103*  BUN _1 < > 14 14  --  12  CREATININE 0.81 0.84 0.92   < > 0.67 0.79 0.90 0.97  CALCIUM 8.2* 8.8* 8.8*   < > 8.5* 8.9  --  8.7*  GFRNONAA >60 >60 >60   < > >60 >60  --  >60  GFRAA >60 >60 >60  --   --   --   --   --   PROT 6.4* 7.2 6.7   < > 6.3* 6.8  --  6.6  ALBUMIN 2.9* 3.3* 3.4*   < > 3.4* 3.7  --  3.5  AST 19 39 26   < > 21 23  --  20  ALT 22 35 22   < > 16 21  --  16  ALKPHOS 64 95 59   < > 45 46  --  51  BILITOT 1.0 1.0 0.9   < > 1.9* 2.1*  --  2.2*   < > =  values in this interval not displayed.    RADIOGRAPHIC STUDIES: I have personally reviewed the radiological images as listed and agreed with the findings in the report. CT CHEST ABDOMEN PELVIS W CONTRAST  Result Date: 05/20/2021 CLINICAL DATA:  Gastrointestinal cancer surveillance. Colorectal carcinoma EXAM: CT CHEST, ABDOMEN, AND PELVIS WITH  CONTRAST TECHNIQUE: Multidetector CT imaging of the chest, abdomen and pelvis was performed following the standard protocol during bolus administration of intravenous contrast. CONTRAST:  174m OMNIPAQUE IOHEXOL 300 MG/ML  SOLN COMPARISON:  PET-CT 01/16/2021 FINDINGS: CT CHEST FINDINGS Cardiovascular: No significant vascular findings. Normal heart size. No pericardial effusion. Port in the anterior chest wall with tip in distal SVC. Mediastinum/Nodes: No axillary or supraclavicular adenopathy. No mediastinal or hilar adenopathy. No pericardial fluid. Esophagus normal. Lungs/Pleura: Partially calcified nodule in the LEFT lower lobe (image 106/4) measures 4 mm. No change from prior. No suspicious nodularity. Musculoskeletal: No aggressive osseous lesion. CT ABDOMEN AND PELVIS FINDINGS Hepatobiliary: No focal hepatic lesion. No biliary ductal dilatation. Gallbladder is normal. Common bile duct is normal. Pancreas: Pancreas is normal. No ductal dilatation. No pancreatic inflammation. Spleen: Normal spleen Adrenals/urinary tract: Adrenal glands and kidneys are normal. Bilateral small benign simple fluid attenuation renal cysts. The ureters and bladder normal. Stomach/Bowel: Stomach, duodenum small-bowel normal. Post RIGHT hemicolectomy anatomy. Nodularity anastomosis. No mesenteric adenopathy. The colon and rectosigmoid colon are normal. Vascular/Lymphatic: Abdominal aorta is normal caliber with atherosclerotic calcification. There is no retroperitoneal or periportal lymphadenopathy. No pelvic lymphadenopathy. Reproductive: Prostate unremarkable Other: No peritoneal nodularity. Musculoskeletal: No aggressive osseous lesion. IMPRESSION: Chest Impression: 1. No evidence of thoracic metastasis. Abdomen / Pelvis Impression: 1. No colorectal cancer recurrence or metastasis in the abdomen pelvis. 2. Post RIGHT hemicolectomy complication. Electronically Signed   By: SSuzy BouchardM.D.   On: 05/20/2021 15:39    ASSESSMENT &  PLAN:   Cancer of right colon ([facility]) #Colon cancer stage III-PT4BN2B- high risk. S/p  adjuvant CIV 5FU x12 cycles [finished Jan 18th, 2022]. June 28th, 2022- No evidence of thoracic metastasis;  No colorectal cancer recurrence or metastasis in the abdomen pelvis. Await on surveillaince colonocsopy.    # minimal GOO in Bil Lungs- ? Post COVID; JUne 2022- CT-resolved.   #  Enlarged prostate [incidental on CT]- PSA- WNL. STABLE.   # COPD-STABLE-   continue inhalers.   # Port flush- q 36M.   # DISPOSITION: # follow up in 3 month 2022;  MD; labs- cbc/cmp;CEA; port flush;-;Dr.B  # I reviewed the blood work- with the patient in detail; also reviewed the imaging independently [as summarized above]; and with the patient in detail.      All questions were answered. The patient knows to call the clinic with any problems, questions or concerns.    GCammie Sickle MD 05/21/2021 11:05 AM

## 2021-05-22 LAB — CEA: CEA: 9.5 ng/mL — ABNORMAL HIGH (ref 0.0–4.7)

## 2021-08-20 ENCOUNTER — Other Ambulatory Visit: Payer: Self-pay | Admitting: *Deleted

## 2021-08-20 ENCOUNTER — Inpatient Hospital Stay: Payer: Medicare HMO | Attending: Internal Medicine

## 2021-08-20 ENCOUNTER — Encounter: Payer: Self-pay | Admitting: Internal Medicine

## 2021-08-20 ENCOUNTER — Inpatient Hospital Stay (HOSPITAL_BASED_OUTPATIENT_CLINIC_OR_DEPARTMENT_OTHER): Payer: Medicare HMO | Admitting: Internal Medicine

## 2021-08-20 ENCOUNTER — Other Ambulatory Visit: Payer: Self-pay

## 2021-08-20 VITALS — BP 130/79 | HR 53 | Temp 97.6°F | Resp 18

## 2021-08-20 DIAGNOSIS — Z9049 Acquired absence of other specified parts of digestive tract: Secondary | ICD-10-CM | POA: Diagnosis not present

## 2021-08-20 DIAGNOSIS — J449 Chronic obstructive pulmonary disease, unspecified: Secondary | ICD-10-CM | POA: Diagnosis not present

## 2021-08-20 DIAGNOSIS — Z87891 Personal history of nicotine dependence: Secondary | ICD-10-CM | POA: Diagnosis not present

## 2021-08-20 DIAGNOSIS — Z79899 Other long term (current) drug therapy: Secondary | ICD-10-CM | POA: Diagnosis not present

## 2021-08-20 DIAGNOSIS — R0602 Shortness of breath: Secondary | ICD-10-CM | POA: Insufficient documentation

## 2021-08-20 DIAGNOSIS — C182 Malignant neoplasm of ascending colon: Secondary | ICD-10-CM | POA: Insufficient documentation

## 2021-08-20 DIAGNOSIS — N400 Enlarged prostate without lower urinary tract symptoms: Secondary | ICD-10-CM | POA: Insufficient documentation

## 2021-08-20 DIAGNOSIS — Z95828 Presence of other vascular implants and grafts: Secondary | ICD-10-CM

## 2021-08-20 LAB — CBC WITH DIFFERENTIAL/PLATELET
Abs Immature Granulocytes: 0.03 10*3/uL (ref 0.00–0.07)
Basophils Absolute: 0.1 10*3/uL (ref 0.0–0.1)
Basophils Relative: 1 %
Eosinophils Absolute: 0.5 10*3/uL (ref 0.0–0.5)
Eosinophils Relative: 5 %
HCT: 43.7 % (ref 39.0–52.0)
Hemoglobin: 14.8 g/dL (ref 13.0–17.0)
Immature Granulocytes: 0 %
Lymphocytes Relative: 28 %
Lymphs Abs: 2.8 10*3/uL (ref 0.7–4.0)
MCH: 32.4 pg (ref 26.0–34.0)
MCHC: 33.9 g/dL (ref 30.0–36.0)
MCV: 95.6 fL (ref 80.0–100.0)
Monocytes Absolute: 1 10*3/uL (ref 0.1–1.0)
Monocytes Relative: 10 %
Neutro Abs: 5.5 10*3/uL (ref 1.7–7.7)
Neutrophils Relative %: 56 %
Platelets: 216 10*3/uL (ref 150–400)
RBC: 4.57 MIL/uL (ref 4.22–5.81)
RDW: 12.3 % (ref 11.5–15.5)
WBC: 9.8 10*3/uL (ref 4.0–10.5)
nRBC: 0 % (ref 0.0–0.2)

## 2021-08-20 LAB — COMPREHENSIVE METABOLIC PANEL
ALT: 22 U/L (ref 0–44)
AST: 22 U/L (ref 15–41)
Albumin: 4 g/dL (ref 3.5–5.0)
Alkaline Phosphatase: 59 U/L (ref 38–126)
Anion gap: 7 (ref 5–15)
BUN: 11 mg/dL (ref 8–23)
CO2: 27 mmol/L (ref 22–32)
Calcium: 8.7 mg/dL — ABNORMAL LOW (ref 8.9–10.3)
Chloride: 103 mmol/L (ref 98–111)
Creatinine, Ser: 0.89 mg/dL (ref 0.61–1.24)
GFR, Estimated: >60 mL/min (ref >60)
Glucose, Bld: 101 mg/dL — ABNORMAL HIGH (ref 70–99)
Potassium: 4.1 mmol/L (ref 3.5–5.1)
Sodium: 137 mmol/L (ref 135–145)
Total Bilirubin: 2.5 mg/dL — ABNORMAL HIGH (ref 0.3–1.2)
Total Protein: 7.3 g/dL (ref 6.5–8.1)

## 2021-08-20 MED ORDER — SODIUM CHLORIDE 0.9% FLUSH
10.0000 mL | Freq: Once | INTRAVENOUS | Status: AC
  Administered 2021-08-20: 10 mL via INTRAVENOUS
  Filled 2021-08-20: qty 10

## 2021-08-20 MED ORDER — HEPARIN SOD (PORK) LOCK FLUSH 100 UNIT/ML IV SOLN
500.0000 [IU] | Freq: Once | INTRAVENOUS | Status: AC
Start: 2021-08-20 — End: 2021-08-20
  Administered 2021-08-20: 500 [IU] via INTRAVENOUS
  Filled 2021-08-20: qty 5

## 2021-08-20 NOTE — Assessment & Plan Note (Signed)
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 S/p  adjuvant CIV 5FU x12 cycles [finished Jan 18th, 2022]. June 28th, 2022- No evidence of thoracic metastasis;  No colorectal cancer recurrence or metastasis in the abdomen pelvis.  Discussed with Dr. Bary Castilla regarding surveillaince colonocsopy.   #Elevated tumor marker- June 2022-9-awaiting labs from today. If significantly elevated will repeat CT scan now.  If stable will repeat in 4 months.   #  Enlarged prostate [incidental on CT]- PSA- WNL. STABLE.   # COPD-STABLE-   continue inhalers.   # Port flush- q 2-43M. Will keep for now.    # DISPOSITION: # port flush in 2 months # follow up in 4 month 2022;  MD; labs- cbc/cmp;CEA; port flush; CTCAP-;Dr.B

## 2021-08-20 NOTE — Progress Notes (Signed)
[place] NOTE  Patient Care Team: Albina Billet, MD as PCP - General (Internal Medicine) Clent Jacks, RN as Oncology Nurse Navigator Cammie Sickle, MD as Consulting Physician (Hematology and Oncology)  CHIEF COMPLAINTS/PURPOSE OF CONSULTATION:  Colon cancer  #  Oncology History Overview Note  # July 2021- RIGHT COLON ADENO CARCINOMA- Dr.Byrnett s/p right hemicolectomy- pT4b N2b- STAGE III [high risk]; MSI-STABLE.   # AUG 10th, 2021- FOLFOX [adjuvant]  -------------------------------------------------------------------------------------------   - INVASIVE MODERATELY DIFFERENTIATED ADENOCARCINOMA.  - TEN OF FIFTY-TWO LYMPH NODES POSITIVE FOR METASTATIC ADENOCARCINOMA  (10/52).  - SINGLE INCIDENTAL TUBULOVILLOUS ADENOMA.  - THREE INCIDENTAL TUBULAR ADENOMAS.  - SEE CANCER SUMMARY.   CANCER CASE SUMMARY: COLON AND RECTUM  Procedure: Right hemicolectomy  Tumor Site: Cecum  Tumor Size: Greatest dimension: 12.2 cm  Macroscopic Tumor Perforation: Present  Histologic Type: Adenocarcinoma  Histologic Grade: G2: Moderately differentiated  Tumor Extension: Tumor directly invades adjacent structures (terminal  ileum)  Margins: All margins are uninvolved by invasive carcinoma, high-grade  dysplasia/intramucosal adenocarcinoma, and low-grade dysplasia  Treatment Effect: No known presurgical therapy  Lymphovascular Invasion: Present  Perineural Invasion: Present  Tumor Deposits: Present: Multiple  Regional Lymph Nodes:       Number of Lymph Nodes Involved: 10       Number of Lymph Nodes Examined: 87  -  # COPD- [Dr.Fleming];   # SURVIVORSHIP:   # GENETICS:   DIAGNOSIS: colon cancer  STAGE:   III      ;  GOALS:cure  CURRENT/MOST RECENT THERAPY : FOLFOX     Cancer of right colon ([place])  05/20/2020 Initial Diagnosis   Cancer of right colon ([place])   07/02/2020 -  Chemotherapy    Patient is on Treatment Plan: COLORECTAL 5FU Q14D X 6  MONTHS       12/08/2020 Cancer Staging   Staging form: Colon and Rectum, AJCC 8th Edition - Clinical: Stage IIIC (cT4b, cN2b, cM0) - Signed by Cammie Sickle, MD on 12/08/2020      HISTORY OF PRESENTING ILLNESS: Alone.  Walks independently.  James Hodge 75 y.o.  male high risk stage III colon cancer currently s/padjuvant 5-FU continuous infusion-on surveillance is here for follow-up.  No abdominal pain.  No nausea vomiting.  No fevers or chills.  No hospitalizations.  Chronic mild shortness of breath.  No blood in stools.   Review of Systems  Constitutional:  Negative for chills, diaphoresis, fever and malaise/fatigue.  HENT:  Negative for nosebleeds and sore throat.   Eyes:  Negative for double vision.  Respiratory:  Positive for shortness of breath. Negative for cough, hemoptysis, sputum production and wheezing.   Cardiovascular:  Negative for chest pain, palpitations and orthopnea.  Gastrointestinal:  Negative for abdominal pain, blood in stool, constipation, diarrhea, heartburn, melena, nausea and vomiting.  Genitourinary:  Negative for dysuria, frequency and urgency.  Musculoskeletal:  Negative for back pain and joint pain.  Skin: Negative.  Negative for itching and rash.  Neurological:  Negative for dizziness, tingling, focal weakness, weakness and headaches.  Endo/Heme/Allergies:  Does not bruise/bleed easily.  Psychiatric/Behavioral:  Negative for depression. The patient is not nervous/anxious and does not have insomnia.     MEDICAL HISTORY:  Past Medical History:  Diagnosis Date   Cancer ([place])    COPD (chronic obstructive pulmonary disease) ([place])    Hypertension     SURGICAL HISTORY: Past Surgical History:  Procedure Laterality Date   CHOLECYSTECTOMY     Around  2001   COLONOSCOPY WITH PROPOFOL N/A 04/12/2020   Procedure: COLONOSCOPY WITH PROPOFOL;  Surgeon: Robert Bellow, MD;  Location: [facility] ENDOSCOPY;  Service: Endoscopy;  Laterality: N/A;    COLONOSCOPY WITH PROPOFOL N/A 04/24/2020   Procedure: COLONOSCOPY WITH PROPOFOL;  Surgeon: Robert Bellow, MD;  Location: [facility] ENDOSCOPY;  Service: Endoscopy;  Laterality: N/A;   ESOPHAGOGASTRODUODENOSCOPY (EGD) WITH PROPOFOL N/A 04/12/2020   Procedure: ESOPHAGOGASTRODUODENOSCOPY (EGD) WITH PROPOFOL;  Surgeon: Robert Bellow, MD;  Location: [facility] ENDOSCOPY;  Service: Endoscopy;  Laterality: N/A;   EYE SURGERY Bilateral    cataract   LAPAROSCOPIC PARTIAL COLECTOMY Right 05/20/2020   Procedure: LAPAROSCOPIC PARTIAL COLECTOMY;  Surgeon: Robert Bellow, MD;  Location: [facility] ORS;  Service: General;  Laterality: Right;  Floyce Stakes, RNFA to assist   PORTACATH PLACEMENT Left 06/19/2020   Procedure: INSERTION PORT-A-CATH;  Surgeon: Robert Bellow, MD;  Location: [facility] ORS;  Service: General;  Laterality: Left;    SOCIAL HISTORY: Social History   Socioeconomic History   Marital status: Married    Spouse name: Not on file   Number of children: Not on file   Years of education: Not on file   Highest education level: Not on file  Occupational History   Not on file  Tobacco Use   Smoking status: Former    Years: 3.00    Types: Cigarettes    Quit date: 11/23/1968    Years since quitting: 52.7   Smokeless tobacco: Never  Vaping Use   Vaping Use: Never used  Substance and Sexual Activity   Alcohol use: Not Currently   Drug use: Never   Sexual activity: Not on file  Other Topics Concern   Not on file  Social History Narrative   Lives in [place]; smoked- quit in 70s; beer social; 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  MEDICATIONS:  Current Outpatient Medications  Medication Sig Dispense Refill   BREO ELLIPTA 100-25 MCG/INH AEPB Inhale 1 puff into the lungs at bedtime.     lidocaine-prilocaine (EMLA) cream Apply 1 application topically as needed. 30 g 3   lisinopril (ZESTRIL) 10 MG tablet Take 10 mg by mouth daily.     potassium chloride SA (KLOR-CON M20) 20 MEQ tablet TAKE 1 TABLET BY MOUTH TWICE A DAY 60 tablet 3   tiotropium (SPIRIVA) 18 MCG inhalation capsule Place 18 mcg into inhaler and inhale at bedtime.      ondansetron (ZOFRAN) 8 MG tablet One pill every 8 hours as needed for nausea/vomitting. (Patient not taking: Reported on 08/20/2021) 40 tablet 1   prochlorperazine (COMPAZINE) 10 MG tablet Take 1 tablet (10 mg total) by mouth every 6 (six) hours as needed for nausea or vomiting. (Patient not taking: Reported on 08/20/2021) 40 tablet 1   No current facility-administered medications for this visit.   Facility-Administered Medications Ordered in Other Visits  Medication Dose Route Frequency Provider Last Rate Last Admin   heparin lock flush 100 unit/mL  500 Units Intravenous Once Cammie Sickle, MD          .  PHYSICAL EXAMINATION: ECOG PERFORMANCE STATUS: 0 - Asymptomatic  Vitals:   08/20/21 1030  BP: 130/79  Pulse: (!) 53  Resp: 18  Temp: 97.6 F (36.4 C)   There were no vitals filed for this visit.   Physical Exam HENT:     Head: Normocephalic and atraumatic.     Mouth/Throat:     Pharynx: No oropharyngeal exudate.  Eyes:     Pupils: Pupils are equal, round, and reactive to light.  Cardiovascular:     Rate and Rhythm: Normal rate and regular rhythm.  Pulmonary:     Effort: No respiratory distress.     Breath sounds: No wheezing.     Comments: Decreased breath sounds Abdominal:     General: Bowel sounds are normal. There is no distension.     Palpations: Abdomen is soft. There is no mass.     Tenderness: There  is no abdominal tenderness. There is no guarding or rebound.  Musculoskeletal:        General: No tenderness. Normal range of motion.     Cervical back: Normal range of motion and neck supple.  Skin:    General: Skin is warm.  Neurological:     Mental Status: He is alert and oriented to person, place, and time.  Psychiatric:        Mood and Affect: Affect normal.     LABORATORY DATA:  I have reviewed the data as listed Lab Results  Component Value Date   WBC 9.8 08/20/2021   HGB 14.8 08/20/2021   HCT 43.7 08/20/2021   MCV 95.6 08/20/2021   PLT 216 08/20/2021   Recent Labs    03/25/21 1007 05/20/21 1035 05/21/21 1006 08/20/21 0958  NA 140  --  139 137  K 3.7  --  3.5 4.1  CL 102  --  105 103  CO2 27  --  29 27  GLUCOSE 92  --  103* 101*  BUN 14  --  12 11  CREATININE 0.79 0.90 0.97 0.89  CALCIUM 8.9  --  8.7* 8.7*  GFRNONAA >60  --  >60 >60  PROT 6.8  --  6.6 7.3  ALBUMIN 3.7  --  3.5 4.0  AST 23  --  20 22  ALT 21  --  16 22  ALKPHOS 46  --  51 59  BILITOT 2.1*  --  2.2* 2.5*    RADIOGRAPHIC STUDIES: I have personally reviewed the radiological images as listed and agreed with the findings in the report. No results found.  ASSESSMENT & PLAN:   Cancer of right colon ([place]) #Colon cancer stage III-PT4BN2B- high risk. S/p  adjuvant CIV 5FU x12 cycles [finished Jan 18th, 2022]. June 28th, 2022- No evidence of thoracic metastasis;  No colorectal cancer recurrence or metastasis in the abdomen pelvis.  Discussed with Dr. Bary Castilla regarding surveillaince colonocsopy.   #Elevated tumor marker- June 2022-9-awaiting labs from today. If significantly elevated will repeat CT scan now.  If stable will repeat in 4 months.   #  Enlarged prostate [incidental on CT]- PSA- WNL. STABLE.   # COPD-STABLE-   continue inhalers.   # Port flush- q 2-86M. Will keep for now.    # DISPOSITION: # port flush in 2 months # follow up in 4 month 2022;  MD; labs- cbc/cmp;CEA; port flush;  CTCAP-;Dr.B      All questions were answered. The patient knows to call the clinic with any problems, questions or concerns.  Cammie Sickle, MD 08/20/2021 11:08 AM

## 2021-08-21 LAB — CEA: CEA: 10.4 ng/mL — ABNORMAL HIGH (ref 0.0–4.7)

## 2021-09-07 ENCOUNTER — Other Ambulatory Visit: Payer: Self-pay | Admitting: General Surgery

## 2021-09-07 NOTE — Progress Notes (Signed)
Subjective:     Patient ID: James Hodge is a 75 y.o. male.   HPI   The following portions of the patient's history were reviewed and updated as appropriate.   This an established patient is here today for: office visit. The patient is here today to discuss a colonoscopy, post right partial colectomy 05-20-2020. He states he is doing well, Denies any pain or rectal bleeding. Bowels move at least 2-3 times a day.         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 10/52 nodes positive. Normal CEA preop.            Past Surgical History:  Procedure Laterality Date   CATARACT EXTRACTION Bilateral     CHOLECYSTECTOMY   2001   COLONOSCOPY   04-12-20, 04-24-20   EGD   04/12/2020   INSERTION CENTRAL VENOUS ACCESS DEVICE W/ SUBCUTANEOUS PORT   06/19/2020   laparoscopic right partial colectomy   05/20/2020    Dr Hervey Ard          Social History           Socioeconomic History   Marital status: Married  Tobacco Use   Smoking status: Former Smoker      Years: 3.00      Types: Cigarettes      Quit date: 02/06/1985      Years since quitting: 36.6   Smokeless tobacco: Never Used  Vaping Use   Vaping Use: Never used  Substance and Sexual Activity   Alcohol use: No      Alcohol/week: 0.0 standard drinks   Drug use: No   Sexual activity: Defer        No Known Allergies   Current Medications        Current Outpatient Medications  Medication Sig Dispense Refill   fluticasone furoate-vilanteroL (BREO ELLIPTA) 100-25 mcg/dose DsDv inhaler Inhale 1 inhalation into the lungs once daily Will need appointment for further refills. 3  each 0   lisinopril (PRINIVIL,ZESTRIL) 10 MG tablet Take 10 mg by mouth once daily.   5   prochlorperazine (COMPAZINE) 10 MG tablet Take by mouth every 6 (six) hours as needed          tiotropium (SPIRIVA WITH HANDIHALER) 18 mcg inhalation capsule Place 1 capsule (18 mcg total) into inhaler and inhale once daily 90 capsule 0   bisacodyL (DULCOLAX) 5 mg EC tablet Take two tablets morning and two tablets afternoon day prior to Miralax prep. 4 tablet 0   polyethylene glycol (MIRALAX) powder One bottle for colonoscopy prep. Use as directed. 255 g 0    No current facility-administered medications for this visit.             Family History  Problem Relation Age of Onset   No Known Problems Mother     No Known Problems Father     Multiple sclerosis Brother          Labs and Radiology:    August 20, 2021 laboratory:   WBC 4.0 - 10.5 K/uL 9.8   RBC 4.22 - 5.81 MIL/uL 4.57   Hemoglobin 13.0 - 17.0 g/dL 14.8   HCT 39.0 - 52.0 % 43.7   MCV 80.0 - 100.0 fL 95.6   MCH 26.0 - 34.0 pg 32.4   MCHC 30.0 - 36.0 g/dL 33.9   RDW 11.5 - 15.5 % 12.3   Platelets 150 - 400 K/uL 216   nRBC 0.0 - 0.2 % 0.0   Neutrophils Relative % % 56   Neutro Abs 1.7 - 7.7 K/uL 5.5   Lymphocytes Relative % 28   Lymphs Abs 0.7 - 4.0 K/uL 2.8   Monocytes Relative % 10   Monocytes Absolute 0.1 - 1.0 K/uL 1.0   Eosinophils Relative % 5   Eosinophils Absolute 0.0 - 0.5 K/uL 0.5   Basophils Relative % 1   Basophils Absolute 0.0 - 0.1 K/uL 0.1   Immature Granulocytes % 0   Abs Immature Granulocytes 0.00 - 0.07 K/uL 0.03     Sodium 135 - 145 mmol/L 137   Potassium 3.5 - 5.1 mmol/L 4.1   Chloride 98 - 111 mmol/L 103   CO2 22 - 32 mmol/L 27   Glucose, Bld 70 - 99 mg/dL 101 High    Comment: Glucose reference range applies only to samples taken after fasting for at least 8 hours.  BUN 8 - 23 mg/dL 11   Creatinine, Ser 0.61 - 1.24 mg/dL 0.89   Calcium 8.9 - 10.3 mg/dL 8.7 Low    Total Protein 6.5 - 8.1 g/dL 7.3    Albumin 3.5 - 5.0 g/dL 4.0   AST 15 - 41 U/L 22   ALT 0 - 44 U/L 22   Alkaline Phosphatase 38 - 126 U/L 59   Total Bilirubin 0.3 - 1.2 mg/dL 2.5 High    GFR, Estimated >60 mL/min >60     Component Ref Range & Units 2 wk ago  (08/20/21) 3 mo ago  (05/21/21) 5 mo ago  (03/25/21) 7 mo ago  (01/21/21) 9 mo ago  (11/25/20) 10 mo ago  (11/11/20) 10 mo ago  (10/28/20)  CEA 0.0 - 4.7 ng/mL 10.4 High   9.5 High  CM  8.4 High  CM  7.9 High  CM  15.3 High  CM  18.0 High  CM  17.9 High  CM       CT of the chest abdomen and pelvis dated May 20, 2021 was independently reviewed:   IMPRESSION: Chest Impression:   1. No evidence of thoracic metastasis.   Abdomen / Pelvis Impression:   1. No colorectal cancer recurrence or metastasis in the abdomen pelvis. 2. Post RIGHT hemicolectomy complication.     Upper endoscopy dated Apr 12, 2020:   Normal.   Review of Systems  Constitutional: Negative for chills and fever.  Respiratory: Negative for cough.          Objective:   Physical Exam Constitutional:      Appearance: Normal appearance.  Cardiovascular:     Rate and Rhythm: Normal rate and regular rhythm.     Pulses: Normal pulses.     Heart sounds: Normal heart sounds.  Pulmonary:     Effort: Pulmonary effort is normal.     Breath sounds: Normal breath sounds.  Abdominal:    Musculoskeletal:     Cervical back: Neck supple.  Neurological:     Mental Status: He is alert and oriented to person, place, and time.  Psychiatric:        Mood and Affect: Mood normal.        Behavior: Behavior normal.           Assessment:     Improvement but not normalization of CEA post resection, no evidence of persistent or metastatic disease.   Candidate for repeat colonoscopy.    Plan:     Indications for follow-up exam were reviewed.  The patient was instructed in regards to preparation by the staff.    This note is partially prepared by Ledell Noss, CMA acting as a scribe in the  presence of Dr. Hervey Ard, MD.  This note is partially prepared by Karie Fetch, RN, acting as a scribe in the presence of Dr. Hervey Ard, MD.    The documentation recorded by the scribe accurately reflects the service I personally performed and the decisions made by me.    Robert Bellow, MD FACS

## 2021-09-10 ENCOUNTER — Ambulatory Visit: Payer: Medicare HMO | Admitting: Certified Registered"

## 2021-09-10 ENCOUNTER — Encounter: Payer: Self-pay | Admitting: General Surgery

## 2021-09-10 ENCOUNTER — Ambulatory Visit
Admission: RE | Admit: 2021-09-10 | Discharge: 2021-09-10 | Disposition: A | Discharge status: Home / Self Care | Payer: Medicare HMO | Attending: General Surgery | Admitting: General Surgery

## 2021-09-10 ENCOUNTER — Encounter
Admission: RE | Disposition: A | Discharge status: Home / Self Care | Payer: Self-pay | Source: Home / Self Care | Attending: General Surgery

## 2021-09-10 DIAGNOSIS — Z9049 Acquired absence of other specified parts of digestive tract: Secondary | ICD-10-CM | POA: Diagnosis not present

## 2021-09-10 DIAGNOSIS — Z79899 Other long term (current) drug therapy: Secondary | ICD-10-CM | POA: Insufficient documentation

## 2021-09-10 DIAGNOSIS — Z85038 Personal history of other malignant neoplasm of large intestine: Secondary | ICD-10-CM | POA: Insufficient documentation

## 2021-09-10 DIAGNOSIS — Z87891 Personal history of nicotine dependence: Secondary | ICD-10-CM | POA: Diagnosis not present

## 2021-09-10 DIAGNOSIS — Z1211 Encounter for screening for malignant neoplasm of colon: Secondary | ICD-10-CM | POA: Insufficient documentation

## 2021-09-10 DIAGNOSIS — Z7951 Long term (current) use of inhaled steroids: Secondary | ICD-10-CM | POA: Diagnosis not present

## 2021-09-10 DIAGNOSIS — K6289 Other specified diseases of anus and rectum: Secondary | ICD-10-CM | POA: Diagnosis not present

## 2021-09-10 HISTORY — PX: COLONOSCOPY WITH PROPOFOL: SHX5780

## 2021-09-10 SURGERY — COLONOSCOPY WITH PROPOFOL
Anesthesia: General

## 2021-09-10 MED ORDER — SODIUM CHLORIDE 0.9 % IV SOLN: INTRAVENOUS | Status: DC

## 2021-09-10 MED ORDER — PROPOFOL 500 MG/50ML IV EMUL
INTRAVENOUS | Status: DC | PRN
  Administered 2021-09-10: 100 ug/kg/min via INTRAVENOUS

## 2021-09-10 MED ORDER — PROPOFOL 10 MG/ML IV BOLUS
INTRAVENOUS | Status: DC | PRN
  Administered 2021-09-10: 70 mg via INTRAVENOUS

## 2021-09-10 MED ORDER — LIDOCAINE HCL (CARDIAC) PF 100 MG/5ML IV SOSY
PREFILLED_SYRINGE | INTRAVENOUS | Status: DC | PRN
  Administered 2021-09-10: 50 mg via INTRAVENOUS

## 2021-09-10 NOTE — Transfer of Care (Signed)
Immediate Anesthesia Transfer of Care Note  Patient: James Hodge  Procedure(s) Performed: COLONOSCOPY WITH PROPOFOL  Patient Location: PACU and Endoscopy Unit  Anesthesia Type:General  Level of Consciousness: awake  Airway & Oxygen Therapy: Patient Spontanous Breathing  Post-op Assessment: Report given to RN and Post -op Vital signs reviewed and stable  Post vital signs: Reviewed and stable  Last Vitals:  Vitals Value Taken Time  BP 97/54 09/10/21 1146  Temp 36.1 C 09/10/21 1144  Pulse 66 09/10/21 1147  Resp 13 09/10/21 1147  SpO2 95 % 09/10/21 1147  Vitals shown include unvalidated device data.  Last Pain:  Vitals:   09/10/21 1144  TempSrc: Temporal  PainSc: Asleep         Complications: No notable events documented.

## 2021-09-10 NOTE — Op Note (Signed)
[facility] Patient Name: James Hodge Procedure Date: 09/10/2021 10:48 AM MRN: [id number] Account #: [id number] Date of Birth: Aug 20, 1946 Admit Type: Outpatient Age: 75 Room: [facility] ENDO ROOM 1 Gender: Male Note Status: Finalized Instrument Name: Peds Colonoscope [id number] Procedure:             Colonoscopy Indications:           High risk colon cancer surveillance: Personal history                         of colon cancer Providers:             Robert Bellow, MD Medicines:             Propofol per Anesthesia Complications:         No immediate complications. Procedure:             Pre-Anesthesia Assessment:                        - Prior to the procedure, a History and Physical was                         performed, and patient medications, allergies and                         sensitivities were reviewed. The patient's tolerance                         of previous anesthesia was reviewed.                        - The risks and benefits of the procedure and the                         sedation options and risks were discussed with the                         patient. All questions were answered and informed                         consent was obtained.                        - Prior to the procedure, a History and Physical was                         performed, and patient medications, allergies and                         sensitivities were reviewed. The patient's tolerance                         of previous anesthesia was reviewed.                        - The risks and benefits of the procedure and the                         sedation options and risks were discussed with the  patient. All questions were answered and informed                         consent was obtained.                        After obtaining informed consent, the colonoscope was                         passed under direct vision. Throughout the  procedure,                         the patient's blood pressure, pulse, and oxygen                         saturations were monitored continuously. The                         Colonoscope was introduced through the anus and                         advanced to the the ileocolonic anastomosis. The                         colonoscopy was somewhat difficult due to significant                         looping. The patient tolerated the procedure well. The                         quality of the bowel preparation was adequate to                         identify polyps 6 mm and larger in size. Findings:      Diffuse moderate inflammation characterized by congestion (edema) and       erythema was found in the rectum. Biopsies were taken with a cold       forceps for histology.      The retroflexed view of the distal rectum and anal verge was normal and       showed no anal or rectal abnormalities. Impression:            - Diffuse moderate inflammation was found in the                         rectum. Biopsied.                        - The distal rectum and anal verge are normal on                         retroflexion view. Recommendation:        - Telephone endoscopist for pathology results in 1                         week. Procedure Code(s):     --- Professional ---                        [phone], Colonoscopy, flexible; with biopsy, single  or                         multiple Diagnosis Code(s):     --- Professional ---                        [phone], Personal history of other malignant neoplasm                         of large intestine                        K62.89, Other specified diseases of anus and rectum CPT copyright 2019 [employer]. All rights reserved. The codes documented in this report are preliminary and upon coder review may  be revised to meet current compliance requirements. Robert Bellow, MD 09/10/2021 11:42:03 AM This report has been signed  electronically. Number of Addenda: 0 Note Initiated On: 09/10/2021 10:48 AM Scope Withdrawal Time: 0 hours 26 minutes 31 seconds  Total Procedure Duration: 0 hours 37 minutes 41 seconds  Estimated Blood Loss:  Estimated blood loss was minimal.      [facility]

## 2021-09-10 NOTE — Anesthesia Postprocedure Evaluation (Signed)
Anesthesia Post Note  Patient: James Hodge  Procedure(s) Performed: COLONOSCOPY WITH PROPOFOL  Patient location during evaluation: Phase II Anesthesia Type: General Level of consciousness: awake and alert, awake and oriented Pain management: pain level controlled Vital Signs Assessment: post-procedure vital signs reviewed and stable Respiratory status: spontaneous breathing, nonlabored ventilation and respiratory function stable Cardiovascular status: blood pressure returned to baseline and stable Postop Assessment: no apparent nausea or vomiting Anesthetic complications: no   No notable events documented.   Last Vitals:  Vitals:   09/10/21 1144 09/10/21 1154  BP: (!) 97/54 112/70  Pulse: 62   Resp: 14   Temp: (!) 36.1 C   SpO2: 96%     Last Pain:  Vitals:   09/10/21 1214  TempSrc:   PainSc: 0-No pain                 Phill Mutter

## 2021-09-10 NOTE — Anesthesia Preprocedure Evaluation (Signed)
Anesthesia Evaluation  Patient identified by MRN, date of birth, ID band Patient awake    Reviewed: Allergy & Precautions, H&P , NPO status , Patient's Chart, lab work & pertinent test results, reviewed documented beta blocker date and time   History of Anesthesia Complications Negative for: history of anesthetic complications  Airway Mallampati: II  TM Distance: >3 FB Neck ROM: full    Dental  (+) Edentulous Lower, Edentulous Upper   Pulmonary neg sleep apnea, COPD,  COPD inhaler, Patient abstained from smoking.Not current smoker, former smoker,    Pulmonary exam normal breath sounds clear to auscultation       Cardiovascular Exercise Tolerance: Good METShypertension, Pt. on medications (-) CAD and (-) Past MI Normal cardiovascular exam(-) dysrhythmias  Rhythm:regular Rate:Normal     Neuro/Psych negative neurological ROS  negative psych ROS   GI/Hepatic negative GI ROS, Neg liver ROS, Bowel prep,neg GERD  ,  Endo/Other  negative endocrine ROSneg diabetes  Renal/GU negative Renal ROS  negative genitourinary   Musculoskeletal   Abdominal   Peds  Hematology negative hematology ROS (+)   Anesthesia Other Findings . COPD (chronic obstructive pulmonary disease) (CMS-HCC)  . Hypertension  . 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 10/52 nodes positive. Normal CEA preop.     Reproductive/Obstetrics negative OB ROS                            Anesthesia Physical  Anesthesia Plan  ASA: 2  Anesthesia Plan: General   Post-op Pain Management:    Induction: Intravenous  PONV Risk Score and Plan: 2 and TIVA and Propofol infusion  Airway Management Planned: Nasal Cannula and Natural Airway  Additional Equipment: None  Intra-op Plan:   Post-operative Plan:   Informed Consent: I have reviewed the patients History and Physical, chart, labs and discussed the  procedure including the risks, benefits and alternatives for the proposed anesthesia with the patient or authorized representative who has indicated his/her understanding and acceptance.     Dental Advisory Given  Plan Discussed with: CRNA, Anesthesiologist and Surgeon  Anesthesia Plan Comments: (Discussed risks of anesthesia with patient, including possibility of difficulty with spontaneous ventilation under anesthesia necessitating airway intervention, PONV, and rare risks such as cardiac or respiratory or neurological events. Patient understands.)        Anesthesia Quick Evaluation

## 2021-09-10 NOTE — H&P (Signed)
James Hodge [id number] 01/08/1946     HPI:  75 y/o male s/p right colectomy for a T3,N2 carcinoma in 2021.  For follow up exam.   Medications Prior to Admission  Medication Sig Dispense Refill Last Dose   BREO ELLIPTA 100-25 MCG/INH AEPB Inhale 1 puff into the lungs at bedtime.   09/09/2021   ondansetron (ZOFRAN) 8 MG tablet One pill every 8 hours as needed for nausea/vomitting. 40 tablet 1 09/09/2021   tiotropium (SPIRIVA) 18 MCG inhalation capsule Place 18 mcg into inhaler and inhale at bedtime.    09/09/2021   lidocaine-prilocaine (EMLA) cream Apply 1 application topically as needed. (Patient not taking: Reported on 09/10/2021) 30 g 3 Not Taking   lisinopril (ZESTRIL) 10 MG tablet Take 10 mg by mouth daily.   09/08/2021   potassium chloride SA (KLOR-CON M20) 20 MEQ tablet TAKE 1 TABLET BY MOUTH TWICE A DAY (Patient not taking: Reported on 09/10/2021) 60 tablet 3 Not Taking   prochlorperazine (COMPAZINE) 10 MG tablet Take 1 tablet (10 mg total) by mouth every 6 (six) hours as needed for nausea or vomiting. (Patient not taking: No sig reported) 40 tablet 1 Not Taking   No Known Allergies Past Medical History:  Diagnosis Date   Cancer ([place])    COPD (chronic obstructive pulmonary disease) ([place])    Hypertension    Past Surgical History:  Procedure Laterality Date   CHOLECYSTECTOMY     Around 2001   COLONOSCOPY WITH PROPOFOL N/A 04/12/2020   Procedure: COLONOSCOPY WITH PROPOFOL;  Surgeon: Robert Bellow, MD;  Location: [facility] ENDOSCOPY;  Service: Endoscopy;  Laterality: N/A;   COLONOSCOPY WITH PROPOFOL N/A 04/24/2020   Procedure: COLONOSCOPY WITH PROPOFOL;  Surgeon: Robert Bellow, MD;  Location: [facility] ENDOSCOPY;  Service: Endoscopy;  Laterality: N/A;   ESOPHAGOGASTRODUODENOSCOPY (EGD) WITH PROPOFOL N/A 04/12/2020   Procedure: ESOPHAGOGASTRODUODENOSCOPY (EGD) WITH PROPOFOL;  Surgeon: Robert Bellow, MD;  Location: [facility] ENDOSCOPY;  Service: Endoscopy;  Laterality: N/A;   EYE  SURGERY Bilateral    cataract   LAPAROSCOPIC PARTIAL COLECTOMY Right 05/20/2020   Procedure: LAPAROSCOPIC PARTIAL COLECTOMY;  Surgeon: Robert Bellow, MD;  Location: [facility] ORS;  Service: General;  Laterality: Right;  Floyce Stakes, RNFA to assist   PORTACATH PLACEMENT Left 06/19/2020   Procedure: INSERTION PORT-A-CATH;  Surgeon: Robert Bellow, MD;  Location: [facility] ORS;  Service: General;  Laterality: Left;   Social History   Socioeconomic History   Marital status: Married    Spouse name: Not on file   Number of children: Not on file   Years of education: Not on file   Highest education level: Not on file  Occupational History   Not on file  Tobacco Use   Smoking status: Former    Years: 3.00    Types: Cigarettes    Quit date: 11/23/1968    Years since quitting: 52.8   Smokeless tobacco: Never  Vaping Use   Vaping Use: Never used  Substance and Sexual Activity   Alcohol use: Not Currently   Drug use: Never   Sexual activity: Not on file  Other Topics Concern   Not on file  Social History Narrative   Lives in [place]; smoked- quit in 70s; beer social; 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     PE: HEENT: Negative. Lungs: Clear. Cardio: RR.   Assessment/Plan:  Proceed with planned endoscopy.  Forest Gleason Reedy Biernat 09/10/2021

## 2021-09-10 NOTE — Anesthesia Procedure Notes (Signed)
Procedure Name: MAC Date/Time: 09/10/2021 11:04 AM Performed by: Biagio Borg, CRNA Pre-anesthesia Checklist: Patient identified, Emergency Drugs available, Suction available, Patient being monitored and Timeout performed Patient Re-evaluated:Patient Re-evaluated prior to induction Oxygen Delivery Method: Nasal cannula Induction Type: IV induction Placement Confirmation: positive ETCO2 and CO2 detector

## 2021-09-11 ENCOUNTER — Encounter: Payer: Self-pay | Admitting: General Surgery

## 2021-09-11 LAB — SURGICAL PATHOLOGY

## 2021-10-22 ENCOUNTER — Inpatient Hospital Stay: Payer: Medicare HMO | Attending: Internal Medicine

## 2021-10-22 ENCOUNTER — Other Ambulatory Visit: Payer: Self-pay

## 2021-10-22 DIAGNOSIS — Z452 Encounter for adjustment and management of vascular access device: Secondary | ICD-10-CM | POA: Diagnosis not present

## 2021-10-22 DIAGNOSIS — Z85038 Personal history of other malignant neoplasm of large intestine: Secondary | ICD-10-CM | POA: Diagnosis not present

## 2021-10-22 DIAGNOSIS — Z95828 Presence of other vascular implants and grafts: Secondary | ICD-10-CM

## 2021-10-22 MED ORDER — SODIUM CHLORIDE 0.9% FLUSH
10.0000 mL | INTRAVENOUS | Status: DC | PRN
  Administered 2021-10-22: 10 mL via INTRAVENOUS
  Filled 2021-10-22: qty 10

## 2021-10-22 MED ORDER — HEPARIN SOD (PORK) LOCK FLUSH 100 UNIT/ML IV SOLN
500.0000 [IU] | Freq: Once | INTRAVENOUS | Status: AC
  Administered 2021-10-22: 500 [IU] via INTRAVENOUS
  Filled 2021-10-22: qty 5

## 2021-12-02 IMAGING — CT NM PET TUM IMG RESTAG (PS) SKULL BASE T - THIGH
1 of 10 series · 1 of 25 positions shown · non-contrast
Comparison: CT chest abdomen pelvis dated 10/21/2020. PET-CT dated
06/11/2020.

CLINICAL DATA: Subsequent treatment strategy for colon cancer.

EXAM:
NUCLEAR MEDICINE PET SKULL BASE TO THIGH
TECHNIQUE: 13.4 mCi F-18 FDG was injected intravenously. Full-ring PET imaging
was performed from the skull base to thigh after the radiotracer. CT
data was obtained and used for attenuation correction and anatomic
localization.
Fasting blood glucose: 88 mg/dl

[Series 3: ct wb 5.0 b30f · axial · 5.0mm · 0.98mm/px · 1 of 329 slices shown]
[im 329/329  brain]
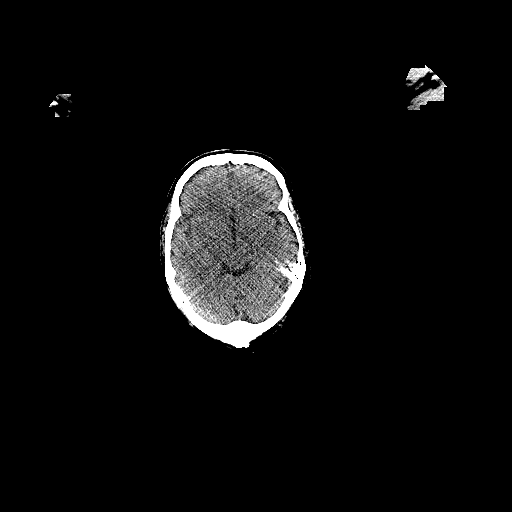

[1 of 25 positions shown; findings below may reference images not displayed]

FINDINGS: Mediastinal blood pool activity: SUV max

Liver activity: SUV max NA

NECK: No hypermetabolic cervical lymphadenopathy.

Incidental CT findings: none

CHEST: No suspicious pulmonary nodules. Very mild subpleural
reticulation in the bilateral upper lobes with
reticulation/dependent atelectasis in the bilateral lower lobes,
possibly post infectious/inflammatory fibrosis related to prior
COVID.

No hypermetabolic thoracic lymphadenopathy.

Left chest port terminates in the lower SVC.

Incidental CT findings: Atherosclerotic calcifications of the aortic
arch. Mild coronary atherosclerosis of the LAD.

ABDOMEN/PELVIS: Status post right hemicolectomy.

No hypermetabolic abdominopelvic lymphadenopathy.

No abnormal hypermetabolism in the liver, spleen, pancreas, or
adrenal glands.

Incidental CT findings: Status post cholecystectomy. Mildly
prominent spleen. Atherosclerotic calcifications the abdominal aorta
and branch vessels. Prostatomegaly.

SKELETON: No focal hypermetabolic activity to suggest skeletal
metastasis.

Incidental CT findings: Mild degenerative changes of the visualized
thoracolumbar spine.
IMPRESSION: Status post right hemicolectomy.

No evidence of recurrent or metastatic disease.

Additional ancillary findings as above.

## 2021-12-08 ENCOUNTER — Encounter: Payer: Self-pay | Admitting: Internal Medicine

## 2021-12-15 ENCOUNTER — Ambulatory Visit: Admission: RE | Admit: 2021-12-15 | Payer: Medicare PPO | Source: Ambulatory Visit

## 2021-12-16 ENCOUNTER — Ambulatory Visit
Admission: RE | Admit: 2021-12-16 | Discharge: 2021-12-16 | Disposition: A | Discharge status: Home / Self Care | Payer: Medicare PPO | Source: Ambulatory Visit | Attending: Internal Medicine | Admitting: Internal Medicine

## 2021-12-16 ENCOUNTER — Other Ambulatory Visit: Payer: Self-pay

## 2021-12-16 DIAGNOSIS — C182 Malignant neoplasm of ascending colon: Secondary | ICD-10-CM | POA: Diagnosis present

## 2021-12-16 LAB — POCT I-STAT CREATININE: Creatinine, Ser: 1 mg/dL (ref 0.61–1.24)

## 2021-12-16 MED ORDER — IOHEXOL 350 MG/ML SOLN
100.0000 mL | Freq: Once | INTRAVENOUS | Status: AC | PRN
  Administered 2021-12-16: 10:00:00 100 mL via INTRAVENOUS

## 2021-12-17 ENCOUNTER — Encounter: Payer: Self-pay | Admitting: Internal Medicine

## 2021-12-17 ENCOUNTER — Inpatient Hospital Stay: Payer: Medicare PPO | Attending: Internal Medicine

## 2021-12-17 ENCOUNTER — Inpatient Hospital Stay (HOSPITAL_BASED_OUTPATIENT_CLINIC_OR_DEPARTMENT_OTHER): Payer: Medicare PPO | Admitting: Internal Medicine

## 2021-12-17 VITALS — BP 128/85 | HR 58 | Temp 97.7°F | Resp 18 | Wt 263.3 lb

## 2021-12-17 DIAGNOSIS — J449 Chronic obstructive pulmonary disease, unspecified: Secondary | ICD-10-CM | POA: Diagnosis not present

## 2021-12-17 DIAGNOSIS — C182 Malignant neoplasm of ascending colon: Secondary | ICD-10-CM | POA: Insufficient documentation

## 2021-12-17 DIAGNOSIS — K2100 Gastro-esophageal reflux disease with esophagitis, without bleeding: Secondary | ICD-10-CM | POA: Diagnosis not present

## 2021-12-17 DIAGNOSIS — I700 Atherosclerosis of aorta: Secondary | ICD-10-CM | POA: Insufficient documentation

## 2021-12-17 DIAGNOSIS — I10 Essential (primary) hypertension: Secondary | ICD-10-CM | POA: Diagnosis not present

## 2021-12-17 DIAGNOSIS — R978 Other abnormal tumor markers: Secondary | ICD-10-CM | POA: Insufficient documentation

## 2021-12-17 DIAGNOSIS — K5900 Constipation, unspecified: Secondary | ICD-10-CM | POA: Diagnosis not present

## 2021-12-17 DIAGNOSIS — Z452 Encounter for adjustment and management of vascular access device: Secondary | ICD-10-CM | POA: Diagnosis not present

## 2021-12-17 LAB — COMPREHENSIVE METABOLIC PANEL
ALT: 19 U/L (ref 0–44)
AST: 20 U/L (ref 15–41)
Albumin: 3.9 g/dL (ref 3.5–5.0)
Alkaline Phosphatase: 56 U/L (ref 38–126)
Anion gap: 6 (ref 5–15)
BUN: 12 mg/dL (ref 8–23)
CO2: 29 mmol/L (ref 22–32)
Calcium: 8.9 mg/dL (ref 8.9–10.3)
Chloride: 105 mmol/L (ref 98–111)
Creatinine, Ser: 0.81 mg/dL (ref 0.61–1.24)
GFR, Estimated: >60 mL/min (ref >60)
Glucose, Bld: 109 mg/dL — ABNORMAL HIGH (ref 70–99)
Potassium: 3.4 mmol/L — ABNORMAL LOW (ref 3.5–5.1)
Sodium: 140 mmol/L (ref 135–145)
Total Bilirubin: 1.9 mg/dL — ABNORMAL HIGH (ref 0.3–1.2)
Total Protein: 7.2 g/dL (ref 6.5–8.1)

## 2021-12-17 LAB — CBC WITH DIFFERENTIAL/PLATELET
Abs Immature Granulocytes: 0.02 10*3/uL (ref 0.00–0.07)
Basophils Absolute: 0.1 10*3/uL (ref 0.0–0.1)
Basophils Relative: 1 %
Eosinophils Absolute: 0.4 10*3/uL (ref 0.0–0.5)
Eosinophils Relative: 4 %
HCT: 43.7 % (ref 39.0–52.0)
Hemoglobin: 14.5 g/dL (ref 13.0–17.0)
Immature Granulocytes: 0 %
Lymphocytes Relative: 29 %
Lymphs Abs: 2.8 10*3/uL (ref 0.7–4.0)
MCH: 31.4 pg (ref 26.0–34.0)
MCHC: 33.2 g/dL (ref 30.0–36.0)
MCV: 94.6 fL (ref 80.0–100.0)
Monocytes Absolute: 0.9 10*3/uL (ref 0.1–1.0)
Monocytes Relative: 9 %
Neutro Abs: 5.7 10*3/uL (ref 1.7–7.7)
Neutrophils Relative %: 57 %
Platelets: 226 10*3/uL (ref 150–400)
RBC: 4.62 MIL/uL (ref 4.22–5.81)
RDW: 12.2 % (ref 11.5–15.5)
WBC: 9.8 10*3/uL (ref 4.0–10.5)
nRBC: 0 % (ref 0.0–0.2)

## 2021-12-17 MED ORDER — HEPARIN SOD (PORK) LOCK FLUSH 100 UNIT/ML IV SOLN
INTRAVENOUS | Status: AC
  Administered 2021-12-17: 09:00:00 500 [IU] via INTRAVENOUS
  Filled 2021-12-17: qty 5

## 2021-12-17 MED ORDER — SODIUM CHLORIDE 0.9% FLUSH
10.0000 mL | Freq: Once | INTRAVENOUS | Status: AC
  Administered 2021-12-17: 09:00:00 10 mL via INTRAVENOUS
  Filled 2021-12-17: qty 10

## 2021-12-17 MED ORDER — HEPARIN SOD (PORK) LOCK FLUSH 100 UNIT/ML IV SOLN
500.0000 [IU] | Freq: Once | INTRAVENOUS | Status: AC
  Filled 2021-12-17: qty 5

## 2021-12-17 NOTE — Progress Notes (Signed)
Patient denies new problems/concerns today.   °

## 2021-12-17 NOTE — Assessment & Plan Note (Addendum)
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 S/p  adjuvant CIV 5FU x12 cycles [finished Jan 18th, 2022]. JAN, 24th- 2023-CT CAP- NED- see below; s/p  Dr. Estill Dooms 2022]surveillaince colonocsopy.   #Elevated tumor marker- June 2021-08-02; -awaiting labs from today. However, CT negative; see above;  Monitor closely.   # COPD-STABLE-   continue inhalers.   #Incidental findings on CT Imaging dated: 19th JAN, 2023: gastroesophageal reflux disease with esophagitis; constipation;  Aortic Atherosclerosis .I reviewed/discussed/counseled the patient.   # Port flush- q 2-23M. Will keep for now.    # DISPOSITION: # port flush in 2 months # follow up in 4 months-  MD; labs- cbc/cmp;CEA; port flush;-;Dr.B  # I reviewed the blood work- with the patient in detail; also reviewed the imaging independently [as summarized above]; and with the patient in detail.

## 2021-12-17 NOTE — Progress Notes (Signed)
[place] NOTE  Patient Care Team: Albina Billet, MD as PCP - General (Internal Medicine) Clent Jacks, RN as Oncology Nurse Navigator Cammie Sickle, MD as Consulting Physician (Hematology and Oncology)  CHIEF COMPLAINTS/PURPOSE OF CONSULTATION:  Colon cancer  #  Oncology History Overview Note  # July 2021- RIGHT COLON ADENO CARCINOMA- Dr.Byrnett s/p right hemicolectomy- pT4b N2b- STAGE III [high risk]; [facility]SI-STABLE.   # AUG 10th, 2021- FOLFOX [adjuvant];  OCT 2022- surveillance colonocsopy. s/p  Dr. Bary Castilla; repeat in 3 years  -------------------------------------------------------------------------------------------   - INVASIVE MODERATELY DIFFERENTIATED ADENOCARCINOMA.  - TEN OF FIFTY-TWO LYMPH NODES POSITIVE FOR METASTATIC ADENOCARCINOMA  (10/52).  - SINGLE INCIDENTAL TUBULOVILLOUS ADENOMA.  - THREE INCIDENTAL TUBULAR ADENOMAS.  - SEE CANCER SUMMARY.   CANCER CASE SUMMARY: COLON AND RECTUM  Procedure: Right hemicolectomy  Tumor Site: Cecum  Tumor Size: Greatest dimension: 12.2 cm  Macroscopic Tumor Perforation: Present  Histologic Type: Adenocarcinoma  Histologic Grade: G2: Moderately differentiated  Tumor Extension: Tumor directly invades adjacent structures (terminal  ileum)  Margins: All margins are uninvolved by invasive carcinoma, high-grade  dysplasia/intramucosal adenocarcinoma, and low-grade dysplasia  Treatment Effect: No known presurgical therapy  Lymphovascular Invasion: Present  Perineural Invasion: Present  Tumor Deposits: Present: Multiple  Regional Lymph Nodes:       Number of Lymph Nodes Involved: 10       Number of Lymph Nodes Examined: 79  -  # COPD- [Dr.Fleming];   # SURVIVORSHIP:   # GENETICS:   DIAGNOSIS: colon cancer  STAGE:   III      ;  GOALS:cure  CURRENT/MOST RECENT THERAPY : FOLFOX     Cancer of right colon ([place])  05/20/2020 Initial Diagnosis   Cancer of right colon ([place])   07/02/2020 -   Chemotherapy    Patient is on Treatment Plan: COLORECTAL 5FU Q14D X 6 MONTHS       12/08/2020 Cancer Staging   Staging form: Colon and Rectum, AJCC 8th Edition - Clinical: Stage IIIC (cT4b, cN2b, cM0) - Signed by Cammie Sickle, MD on 12/08/2020       HISTORY OF PRESENTING ILLNESS: Alone.  Walks independently.  James Hodge 76 y.o.  male high risk stage III colon cancer currently s/padjuvant 5-FU continuous infusion-on surveillance is here for follow-up/review results of the CT scan.  Initially patient underwent colonoscopy with Dr. Bary Castilla.   Denies abdominal pain nausea vomiting.  Denies any chest pain or shortness of breath.  Review of Systems  Constitutional:  Negative for chills, diaphoresis, fever and malaise/fatigue.  HENT:  Negative for nosebleeds and sore throat.   Eyes:  Negative for double vision.  Respiratory:  Negative for cough, hemoptysis, sputum production and wheezing.   Cardiovascular:  Negative for chest pain, palpitations and orthopnea.  Gastrointestinal:  Negative for abdominal pain, blood in stool, constipation, diarrhea, heartburn, melena, nausea and vomiting.  Genitourinary:  Negative for dysuria, frequency and urgency.  Musculoskeletal:  Negative for back pain and joint pain.  Skin: Negative.  Negative for itching and rash.  Neurological:  Negative for dizziness, tingling, focal weakness, weakness and headaches.  Endo/Heme/Allergies:  Does not bruise/bleed easily.  Psychiatric/Behavioral:  Negative for depression. The patient is not nervous/anxious and does not have insomnia.     MEDICAL HISTORY:  Past Medical History:  Diagnosis Date   Cancer ([place])    COPD (chronic obstructive pulmonary disease) ([place])    Hypertension     SURGICAL HISTORY: Past Surgical History:  Procedure  Laterality Date   CHOLECYSTECTOMY     Around 2001   COLONOSCOPY WITH PROPOFOL N/A 04/12/2020   Procedure: COLONOSCOPY WITH PROPOFOL;  Surgeon: Robert Bellow,  MD;  Location: [facility] END[facility]COPY;  Service: Endoscopy;  Laterality: N/A;   COLONOSCOPY WITH PROPOFOL N/A 04/24/2020   Procedure: COLONOSCOPY WITH PROPOFOL;  Surgeon: Robert Bellow, MD;  Location: [facility] ENDOSCOPY;  Service: Endoscopy;  Laterality: N/A;   COLONOSCOPY WITH PROPOFOL N/A 09/10/2021   Procedure: COLONOSCOPY WITH PROPOFOL;  Surgeon: Robert Bellow, MD;  Location: [facility] END[facility];  Service: Endoscopy;  Laterality: N/A;   ESOPHAGOGASTRODUODENOSCOPY (EGD) WITH PROPOFOL N/A 04/12/2020   Procedure: ESOPHAGOGASTRODUODENOSCOPY (EGD) WITH PROPOFOL;  Surgeon: Robert Bellow, MD;  Location: [facility] END[facility];  Service: Endoscopy;  Laterality: N/A;   EYE SURGERY Bilateral    cataract   LAPAROSCOPIC PARTIAL COLECTOMY Right 05/20/2020   Procedure: LAPAROSCOPIC PARTIAL COLECTOMY;  Surgeon: Robert Bellow, MD;  Location: [facility] ORS;  Service: General;  Laterality: Right;  Floyce Stakes, RNFA to assist   PORTACATH PLACEMENT Left 06/19/2020   Procedure: INSERTION PORT-A-CATH;  Surgeon: Robert Bellow, MD;  Location: [facility] OR[facility];  Service: General;  Laterality: Left;    SOCIAL HISTORY: Social History   Socioeconomic History   Marital status: Married    Spouse name: Not on file   Number of children: Not on file   Years of education: Not on file   Highest education level: Not on file  Occupational History   Not on file  Tobacco Use   Smoking status: Former    Years: 3.00    Types: Cigarettes    Quit date: 11/23/1968    Years since quitting: 53.1   Smokeless tobacco: Never  Vaping Use   Vaping Use: Never used  Substance and Sexual Activity   Alcohol use: Not Currently   Drug use: Never   Sexual activity: Not on file  Other Topics Concern   Not on file  Social History Narrative   Lives in [place]; smoked- quit in 70s; beer social; 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; grandson    Social Determinants of Health   Financial Resource Strain: Not on file  Food Insecurity:  Not on file  Transportation Needs: Not on file  Physical Activity: Not on file  Stress: Not on file  Social Connections: Not on file  Intimate Partner Violence: Not on file    FAMILY HISTORY: Family History  Problem Relation Age of Onset   Multiple sclerosis Brother     ALLERGIES:  has No Known Allergies.  MEDICATIONS:  Current Outpatient Medications  Medication Sig Dispense Refill   BREO ELLIPTA 100-25 MCG/INH AEPB Inhale 1 puff into the lungs at bedtime.     Butalbital-APAP-Caffeine 50-300-40 MG CAPS Take 1-2 tablets by mouth every 8 (eight) hours as needed.     fexofenadine (ALLEGRA) 180 MG tablet Take 180 mg by mouth daily.     lidocaine-prilocaine (EMLA) cream Apply 1 application topically as needed. 30 g 3   lisinopril (ZESTRIL) 10 MG tablet Take 10 mg by mouth daily.     ondansetron (ZOFRAN) 8 MG tablet One pill every 8 hours as needed for nausea/vomitting. 40 tablet 1   tiotropium (SPIRIVA) 18 MCG inhalation capsule Place 18 mcg into inhaler and inhale at bedtime.      potassium chloride SA (KLOR-CON M20) 20 MEQ tablet TAKE 1 TABLET BY MOUTH TWICE A DAY (Patient not taking: Reported on 09/10/2021) 60 tablet 3   prochlorperazine (COMPAZINE) 10 MG tablet Take  1 tablet (10 mg total) by mouth every 6 (six) hours as needed for nausea or vomiting. (Patient not taking: Reported on 08/20/2021) 40 tablet 1   No current facility-administered medications for this visit.   Facility-Administered Medications Ordered in Other Visits  Medication Dose Route Frequency Provider Last Rate Last Admin   heparin lock flush 100 unit/mL  500 Units Intravenous Once Cammie Sickle, MD          .  PHYSICAL EXAMINATION: ECOG PERFORMANCE STATUS: 0 - Asymptomatic  Vitals:   12/17/21 1013  BP: 128/85  Pulse: (!) 58  Resp: 18  Temp: 97.7 F (36.5 C)   Filed Weights   12/17/21 1013  Weight: 263 lb 4.8 oz (119.4 kg)     Physical Exam HENT:     Head: Normocephalic and  atraumatic.     Mouth/Throat:     Pharynx: No oropharyngeal exudate.  Eyes:     Pupils: Pupils are equal, round, and reactive to light.  Cardiovascular:     Rate and Rhythm: Normal rate and regular rhythm.  Pulmonary:     Effort: No respiratory distress.     Breath sounds: No wheezing.     Comments: Decreased breath sounds Abdominal:     General: Bowel sounds are normal. There is no distension.     Palpations: Abdomen is soft. There is no mass.     Tenderness: There is no abdominal tenderness. There is no guarding or rebound.  Musculoskeletal:        General: No tenderness. Normal range of motion.     Cervical back: Normal range of motion and neck supple.  Skin:    General: Skin is warm.  Neurological:     Mental Status: He is alert and oriented to person, place, and time.  Psychiatric:        Mood and Affect: Affect normal.     LABORATORY DATA:  I have reviewed the data as listed Lab Results  Component Value Date   WBC 9.8 12/17/2021   HGB 14.5 12/17/2021   HCT 43.7 12/17/2021   MCV 94.6 12/17/2021   PLT 226 12/17/2021   Recent Labs    05/21/21 1006 08/20/21 0958 12/16/21 1004 12/17/21 0909  NA 139 137  --  140  K 3.5 4.1  --  3.4*  CL 105 103  --  105  CO2 29 27  --  29  GLUCOSE 103* 101*  --  109*  BUN 12 11  --  12  CREATININE 0.97 0.89 1.00 0.81  CALCIUM 8.7* 8.7*  --  8.9  GFRNONAA >60 >60  --  >60  PROT 6.6 7.3  --  7.2  ALBUMIN 3.5 4.0  --  3.9  AST 20 22  --  20  ALT 16 22  --  19  ALKPHOS 51 59  --  56  BILITOT 2.2* 2.5*  --  1.9*    RADIOGRAPHIC STUDIES: I have personally reviewed the radiological images as listed and agreed with the findings in the report. CT CHEST ABDOMEN PELVIS W CONTRAST  Result Date: 12/16/2021 CLINICAL DATA:  History of colorectal cancer, surveillance EXAM: CT CHEST, ABDOMEN, AND PELVIS WITH CONTRAST TECHNIQUE: Multidetector CT imaging of the chest, abdomen and pelvis was performed following the standard protocol during  bolus administration of intravenous contrast. RADIATION DOSE REDUCTION: This exam was performed according to the departmental dose-optimization program which includes automated exposure control, adjustment of the mA and/or kV according to patient size and/or  use of iterative reconstruction technique. CONTRAST:  15m OMNIPAQUE IOHEXOL 350 MG/ML SOLN COMPARISON:  Multiple priors including most recent CT May 20, 2021 FINDINGS: CT CHEST FINDINGS Cardiovascular: Left chest Port-A-Cath with tip in the SVC. Aortic atherosclerosis without aneurysmal dilation. No central pulmonary embolus on this nondedicated study. Normal size heart. No significant pericardial effusion/thickening. Left anterior descending coronary artery calcifications. Mediastinum/Nodes: No supraclavicular adenopathy. No discrete thyroid nodularity. No pathologically enlarged mediastinal, hilar or axillary lymph nodes. Symmetric distal esophageal wall thickening with reflux versus retained contrast in the esophagus. Lungs/Pleura: Partially calcified nodule in the right lower lobe measuring 4 mm on image 104/3 unchanged. No suspicious pulmonary nodules or masses. Similar mild biapical pleuroparenchymal scarring. No pleural effusion. No pneumothorax. Musculoskeletal: No aggressive lytic or blastic lesion of bone. CT ABDOMEN PELVIS FINDINGS Hepatobiliary: No suspicious hepatic lesion. Gallbladder is surgically absent. No biliary ductal dilation. Pancreas: No pancreatic ductal dilation or evidence of acute inflammation. Spleen: Normal in size without focal abnormality. Adrenals/Urinary Tract: Bilateral adrenal glands appear normal. No hydronephrosis. Hypodense 17 mm exophytic right lower pole renal cyst. Exophytic hypodense 2 cm left upper pole renal cyst. Subcentimeter hypodense bilateral renal lesions are too small to accurately characterize but statistically likely reflect cysts. No solid enhancing renal mass. The kidneys demonstrate symmetric enhancement  and excretion contrast material. No suspicious filling defect visualized within the opacified portions of the collecting systems or proximal ureters on delayed imaging. Urinary bladder is not well distended limiting evaluation. Stomach/Bowel: Radiopaque enteric contrast material traverses loops of proximal colon. Stomach is unremarkable for degree of distension. No pathologic dilation of small or large bowel. Postsurgical change of right hemicolectomy with anastomosis in the right upper quadrant without suspicious soft tissue nodularity along the anastomotic sutures. Moderate volume of formed stool throughout the colon suggestive of constipation Vascular/Lymphatic: Aortic and branch vessel atherosclerosis without abdominal aortic aneurysm. No pathologically enlarged abdominal or pelvic lymph nodes. Reproductive: Mild enlargement of the prostate gland is unchanged from prior. Other: No significant abdominopelvic free fluid. No discrete omental or peritoneal nodularity. Musculoskeletal: No aggressive lytic or blastic lesion of bone. IMPRESSION: 1. Stable examination post right hemicolectomy without evidence of local recurrence or metastatic disease in the chest abdomen or pelvis. 2. Symmetric distal esophageal wall thickening with reflux versus retained contrast in the esophagus, possibly reflecting gastroesophageal reflux disease with esophagitis. 3. Moderate volume of formed stool throughout the colon suggestive of constipation. 4.  Aortic Atherosclerosis (ICD10-I70.0). Electronically Signed   By: JDahlia BailiffM.D.   On: 12/16/2021 13:31    ASSESSMENT & PLAN:   [place] #Colon cancer stage III-[id number]- high risk. S/p  adjuvant CIV 5FU x12 cycles [finished Jan 18th, 2022]. JAN, 24th- 2023-CT CAP- NED- see below; s/p  Dr. BEstill Dooms2022]surveillaince colonocsopy.   #Elevated tumor marker- June 2021-08-02; -awaiting labs from today. However, CT negative; see above;  Monitor closely.   #  COPD-STABLE-   continue inhalers.   #Incidental findings on CT Imaging dated: 19th JAN, 2023: gastroesophageal reflux disease with esophagitis; constipation;  Aortic Atherosclerosis .I reviewed/discussed/counseled the patient.   # Port flush- q 2-56M. Will keep for now.    # DISPOSITION: # port flush in 2 months # follow up in 4 months-  MD; labs- cbc/cmp;CEA; port flush;-;Dr.B  # I reviewed the blood work- with the patient in detail; also reviewed the imaging independently [as summarized above]; and with the patient in detail.      All questions were answered. The patient knows  to call the clinic with any problems, questions or concerns.    Cammie Sickle, MD 12/17/2021 11:32 AM

## 2021-12-18 LAB — CEA: CEA: 6.6 ng/mL — ABNORMAL HIGH (ref 0.0–4.7)

## 2022-02-02 ENCOUNTER — Other Ambulatory Visit: Payer: Self-pay | Admitting: *Deleted

## 2022-02-02 DIAGNOSIS — Z95828 Presence of other vascular implants and grafts: Secondary | ICD-10-CM

## 2022-02-02 MED ORDER — LIDOCAINE-PRILOCAINE 2.5-2.5 % EX CREA: 1.0000 "application " | TOPICAL_CREAM | CUTANEOUS | 3 refills | Status: AC | PRN

## 2022-02-04 ENCOUNTER — Encounter: Payer: Self-pay | Admitting: Internal Medicine

## 2022-02-11 ENCOUNTER — Other Ambulatory Visit: Payer: Self-pay

## 2022-02-11 ENCOUNTER — Inpatient Hospital Stay: Payer: Medicare HMO | Attending: Internal Medicine

## 2022-02-11 DIAGNOSIS — Z452 Encounter for adjustment and management of vascular access device: Secondary | ICD-10-CM | POA: Diagnosis not present

## 2022-02-11 DIAGNOSIS — Z85038 Personal history of other malignant neoplasm of large intestine: Secondary | ICD-10-CM | POA: Insufficient documentation

## 2022-02-11 DIAGNOSIS — Z95828 Presence of other vascular implants and grafts: Secondary | ICD-10-CM

## 2022-02-11 MED ORDER — HEPARIN SOD (PORK) LOCK FLUSH 100 UNIT/ML IV SOLN
500.0000 [IU] | Freq: Once | INTRAVENOUS | Status: AC
  Administered 2022-02-11: 500 [IU] via INTRAVENOUS
  Filled 2022-02-11: qty 5

## 2022-02-11 MED ORDER — SODIUM CHLORIDE 0.9% FLUSH
10.0000 mL | Freq: Once | INTRAVENOUS | Status: AC
  Administered 2022-02-11: 10 mL via INTRAVENOUS
  Filled 2022-02-11: qty 10

## 2022-04-15 ENCOUNTER — Inpatient Hospital Stay (HOSPITAL_BASED_OUTPATIENT_CLINIC_OR_DEPARTMENT_OTHER): Payer: Medicare HMO | Admitting: Internal Medicine

## 2022-04-15 ENCOUNTER — Encounter: Payer: Self-pay | Admitting: Internal Medicine

## 2022-04-15 ENCOUNTER — Inpatient Hospital Stay: Payer: Medicare HMO | Attending: Internal Medicine

## 2022-04-15 DIAGNOSIS — J449 Chronic obstructive pulmonary disease, unspecified: Secondary | ICD-10-CM | POA: Insufficient documentation

## 2022-04-15 DIAGNOSIS — C182 Malignant neoplasm of ascending colon: Secondary | ICD-10-CM | POA: Diagnosis present

## 2022-04-15 DIAGNOSIS — Z452 Encounter for adjustment and management of vascular access device: Secondary | ICD-10-CM | POA: Insufficient documentation

## 2022-04-15 DIAGNOSIS — Z95828 Presence of other vascular implants and grafts: Secondary | ICD-10-CM

## 2022-04-15 LAB — CBC WITH DIFFERENTIAL/PLATELET
Abs Immature Granulocytes: 0.04 10*3/uL (ref 0.00–0.07)
Basophils Absolute: 0 10*3/uL (ref 0.0–0.1)
Basophils Relative: 0 %
Eosinophils Absolute: 0.3 10*3/uL (ref 0.0–0.5)
Eosinophils Relative: 3 %
HCT: 42.1 % (ref 39.0–52.0)
Hemoglobin: 14.2 g/dL (ref 13.0–17.0)
Immature Granulocytes: 0 %
Lymphocytes Relative: 25 %
Lymphs Abs: 2.4 10*3/uL (ref 0.7–4.0)
MCH: 32.1 pg (ref 26.0–34.0)
MCHC: 33.7 g/dL (ref 30.0–36.0)
MCV: 95.2 fL (ref 80.0–100.0)
Monocytes Absolute: 0.8 10*3/uL (ref 0.1–1.0)
Monocytes Relative: 8 %
Neutro Abs: 6.2 10*3/uL (ref 1.7–7.7)
Neutrophils Relative %: 64 %
Platelets: 220 10*3/uL (ref 150–400)
RBC: 4.42 MIL/uL (ref 4.22–5.81)
RDW: 12.8 % (ref 11.5–15.5)
WBC: 9.7 10*3/uL (ref 4.0–10.5)
nRBC: 0 % (ref 0.0–0.2)

## 2022-04-15 LAB — COMPREHENSIVE METABOLIC PANEL
ALT: 16 U/L (ref 0–44)
AST: 22 U/L (ref 15–41)
Albumin: 3.5 g/dL (ref 3.5–5.0)
Alkaline Phosphatase: 48 U/L (ref 38–126)
Anion gap: 5 (ref 5–15)
BUN: 16 mg/dL (ref 8–23)
CO2: 29 mmol/L (ref 22–32)
Calcium: 8.4 mg/dL — ABNORMAL LOW (ref 8.9–10.3)
Chloride: 107 mmol/L (ref 98–111)
Creatinine, Ser: 1.06 mg/dL (ref 0.61–1.24)
GFR, Estimated: >60 mL/min (ref >60)
Glucose, Bld: 106 mg/dL — ABNORMAL HIGH (ref 70–99)
Potassium: 3.8 mmol/L (ref 3.5–5.1)
Sodium: 141 mmol/L (ref 135–145)
Total Bilirubin: 1.5 mg/dL — ABNORMAL HIGH (ref 0.3–1.2)
Total Protein: 6.8 g/dL (ref 6.5–8.1)

## 2022-04-15 MED ORDER — HEPARIN SOD (PORK) LOCK FLUSH 100 UNIT/ML IV SOLN
500.0000 [IU] | Freq: Once | INTRAVENOUS | Status: AC
  Administered 2022-04-15: 500 [IU] via INTRAVENOUS
  Filled 2022-04-15: qty 5

## 2022-04-15 MED ORDER — SODIUM CHLORIDE 0.9% FLUSH
10.0000 mL | Freq: Once | INTRAVENOUS | Status: AC
  Administered 2022-04-15: 10 mL via INTRAVENOUS
  Filled 2022-04-15: qty 10

## 2022-04-15 NOTE — Progress Notes (Signed)
[facility] NOTE  Patient Care Team: Albina Billet, MD as PCP - General (Internal Medicine) Clent Jacks, RN as Oncology Nurse Navigator Cammie Sickle, MD as Consulting Physician (Hematology and Oncology)  CHIEF COMPLAINTS/PURPOSE OF CONSULTATION: Colon cancer  #  Oncology History Overview Note  # July 2021- RIGHT COLON ADENO CARCINOMA- Dr.Byrnett s/p right hemicolectomy- pT4b N2b- STAGE III [high risk]; MSI-STABLE.   # AUG 10th, 2021- FOLFOX [adjuvant];  OCT 2022- surveillance colonocsopy. s/p  Dr. Bary Castilla; repeat in 3 years  -------------------------------------------------------------------------------------------   - INVASIVE MODERATELY DIFFERENTIATED ADENOCARCINOMA.  - TEN OF FIFTY-TWO LYMPH NODES POSITIVE FOR METASTATIC ADENOCARCINOMA  (10/52).  - SINGLE INCIDENTAL TUBULOVILLOUS ADENOMA.  - THREE INCIDENTAL TUBULAR ADENOMAS.  - SEE CANCER SUMMARY.   CANCER CASE SUMMARY: COLON AND RECTUM  Procedure: Right hemicolectomy  Tumor Site: Cecum  Tumor Size: Greatest dimension: 12.2 cm  Macroscopic Tumor Perforation: Present  Histologic Type: Adenocarcinoma  Histologic Grade: G2: Moderately differentiated  Tumor Extension: Tumor directly invades adjacent structures (terminal  ileum)  Margins: All margins are uninvolved by invasive carcinoma, high-grade  dysplasia/intramucosal adenocarcinoma, and low-grade dysplasia  Treatment Effect: No known presurgical therapy  Lymphovascular Invasion: Present  Perineural Invasion: Present  Tumor Deposits: Present: Multiple  Regional Lymph Nodes:       Number of Lymph Nodes Involved: 10       Number of Lymph Nodes Examined: 1  -  # COPD- [Dr.Fleming];   # SURVIVORSHIP:   # GENETICS:   DIAGNOSIS: colon cancer  STAGE:   III      ;  GOALS:cure  CURRENT/MOST RECENT THERAPY : FOLFOX     Cancer of right colon ([place])  05/20/2020 Initial Diagnosis   Cancer of right colon ([place])   07/02/2020 -   Chemotherapy    Patient is on Treatment Plan: COLORECTAL 5FU Q14D X 6 MONTHS       12/08/2020 Cancer Staging   Staging form: Colon and Rectum, AJCC 8th Edition - Clinical: Stage IIIC (cT4b, cN2b, cM0) - Signed by Cammie Sickle, MD on 12/08/2020       HISTORY OF PRESENTING ILLNESS: Alone.  Walks independently.  James Hodge 76 y.o.  male high risk stage III colon cancer currently s/p adjuvant 5-FU continuous infusion-on surveillance is here for follow-up/.  Patient has not had any hospitalizations.  Denies any weight loss.  Denies any nausea vomiting abdominal pain.  No blood in stools or black-colored stools.    Review of Systems  Constitutional:  Negative for chills, diaphoresis, fever and malaise/fatigue.  HENT:  Negative for nosebleeds and sore throat.   Eyes:  Negative for double vision.  Respiratory:  Negative for cough, hemoptysis, sputum production and wheezing.   Cardiovascular:  Negative for chest pain, palpitations and orthopnea.  Gastrointestinal:  Negative for abdominal pain, blood in stool, constipation, diarrhea, heartburn, melena, nausea and vomiting.  Genitourinary:  Negative for dysuria, frequency and urgency.  Musculoskeletal:  Negative for back pain and joint pain.  Skin: Negative.  Negative for itching and rash.  Neurological:  Negative for dizziness, tingling, focal weakness, weakness and headaches.  Endo/Heme/Allergies:  Does not bruise/bleed easily.  Psychiatric/Behavioral:  Negative for depression. The patient is not nervous/anxious and does not have insomnia.     MEDICAL HISTORY:  Past Medical History:  Diagnosis Date   Cancer ([place])    COPD (chronic obstructive pulmonary disease) ([place])    Hypertension     SURGICAL HISTORY: Past Surgical History:  Procedure  Laterality Date   CHOLECYSTECTOMY     Around 2001   COLONOSCOPY WITH PROPOFOL N/A 04/12/2020   Procedure: COLONOSCOPY WITH PROPOFOL;  Surgeon: Robert Bellow, MD;  Location: [facility]  ENDOSCOPY;  Service: Endoscopy;  Laterality: N/A;   COLONOSCOPY WITH PROPOFOL N/A 04/24/2020   Procedure: COLONOSCOPY WITH PROPOFOL;  Surgeon: Robert Bellow, MD;  Location: [facility] ENDOSCOPY;  Service: Endoscopy;  Laterality: N/A;   COLONOSCOPY WITH PROPOFOL N/A 09/10/2021   Procedure: COLONOSCOPY WITH PROPOFOL;  Surgeon: Robert Bellow, MD;  Location: [facility] ENDOSCOPY;  Service: Endoscopy;  Laterality: N/A;   ESOPHAGOGASTRODUODENOSCOPY (EGD) WITH PROPOFOL N/A 04/12/2020   Procedure: ESOPHAGOGASTRODUODENOSCOPY (EGD) WITH PROPOFOL;  Surgeon: Robert Bellow, MD;  Location: [facility] ENDOSCOPY;  Service: Endoscopy;  Laterality: N/A;   EYE SURGERY Bilateral    cataract   LAPAROSCOPIC PARTIAL COLECTOMY Right 05/20/2020   Procedure: LAPAROSCOPIC PARTIAL COLECTOMY;  Surgeon: Robert Bellow, MD;  Location: [facility] ORS;  Service: General;  Laterality: Right;  Floyce Stakes, RNFA to assist   PORTACATH PLACEMENT Left 06/19/2020   Procedure: INSERTION PORT-A-CATH;  Surgeon: Robert Bellow, MD;  Location: [facility] ORS;  Service: General;  Laterality: Left;    SOCIAL HISTORY: Social History   Socioeconomic History   Marital status: Married    Spouse name: Not on file   Number of children: Not on file   Years of education: Not on file   Highest education level: Not on file  Occupational History   Not on file  Tobacco Use   Smoking status: Former    Years: 3.00    Types: Cigarettes    Quit date: 11/23/1968    Years since quitting: 53.4   Smokeless tobacco: Never  Vaping Use   Vaping Use: Never used  Substance and Sexual Activity   Alcohol use: Not Currently   Drug use: Never   Sexual activity: Not on file  Other Topics Concern   Not on file  Social History Narrative   Lives in [place]; smoked- quit in 70s; beer social; 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; grandson    Social Determinants of Health   Financial Resource Strain: Not on file  Food Insecurity: Not on file  Transportation Needs:  Not on file  Physical Activity: Not on file  Stress: Not on file  Social Connections: Not on file  Intimate Partner Violence: Not on file    FAMILY HISTORY: Family History  Problem Relation Age of Onset   Multiple sclerosis Brother     ALLERGIES:  has No Known Allergies.  MEDICATIONS:  Current Outpatient Medications  Medication Sig Dispense Refill   BREO ELLIPTA 100-25 MCG/INH AEPB Inhale 1 puff into the lungs at bedtime.     Butalbital-APAP-Caffeine 50-300-40 MG CAPS Take 1-2 tablets by mouth every 8 (eight) hours as needed.     fexofenadine (ALLEGRA) 180 MG tablet Take 180 mg by mouth daily.     fexofenadine (ALLEGRA) 180 MG tablet Take 1 tablet by mouth daily.     lidocaine-prilocaine (EMLA) cream Apply 1 application. topically as needed. 30 g 3   lisinopril (ZESTRIL) 10 MG tablet Take 10 mg by mouth daily.     ondansetron (ZOFRAN) 8 MG tablet One pill every 8 hours as needed for nausea/vomitting. 40 tablet 1   potassium chloride SA (KLOR-CON M20) 20 MEQ tablet TAKE 1 TABLET BY MOUTH TWICE A DAY 60 tablet 3   prochlorperazine (COMPAZINE) 10 MG tablet Take 1 tablet (10 mg total) by mouth every 6 (six) hours  as needed for nausea or vomiting. 40 tablet 1   SYMBICORT 160-4.5 MCG/ACT inhaler Inhale into the lungs.     tiotropium (SPIRIVA) 18 MCG inhalation capsule Place 18 mcg into inhaler and inhale at bedtime.      GI Cocktail (alum & mag hydroxide-simethicone/lidocaine)oral mixture Each dose to contain 57m Maalox and 161mviscous lidocaine 300 mL 0   No current facility-administered medications for this visit.   Facility-Administered Medications Ordered in Other Visits  Medication Dose Route Frequency Provider Last Rate Last Admin   heparin lock flush 100 unit/mL  500 Units Intravenous Once BrCammie SickleMD          .  PHYSICAL EXAMINATION: ECOG PERFORMANCE STATUS: 0 - Asymptomatic  Vitals:   04/15/22 1042  BP: (!) 148/85  Pulse: (!) 55  Temp: 97.6 F (36.4  C)  SpO2: 97%   Filed Weights   04/15/22 1042  Weight: 270 lb 11.2 oz (122.8 kg)     Physical Exam HENT:     Head: Normocephalic and atraumatic.     Mouth/Throat:     Pharynx: No oropharyngeal exudate.  Eyes:     Pupils: Pupils are equal, round, and reactive to light.  Cardiovascular:     Rate and Rhythm: Normal rate and regular rhythm.  Pulmonary:     Effort: No respiratory distress.     Breath sounds: No wheezing.     Comments: Decreased breath sounds Abdominal:     General: Bowel sounds are normal. There is no distension.     Palpations: Abdomen is soft. There is no mass.     Tenderness: There is no abdominal tenderness. There is no guarding or rebound.  Musculoskeletal:        General: No tenderness. Normal range of motion.     Cervical back: Normal range of motion and neck supple.  Skin:    General: Skin is warm.  Neurological:     Mental Status: He is alert and oriented to person, place, and time.  Psychiatric:        Mood and Affect: Affect normal.     LABORATORY DATA:  I have reviewed the data as listed Lab Results  Component Value Date   WBC 9.7 04/15/2022   HGB 14.2 04/15/2022   HCT 42.1 04/15/2022   MCV 95.2 04/15/2022   PLT 220 04/15/2022   Recent Labs    08/20/21 0958 12/16/21 1004 12/17/21 0909 04/15/22 1015  NA 137  --  140 141  K 4.1  --  3.4* 3.8  CL 103  --  105 107  CO2 27  --  29 29  GLUCOSE 101*  --  109* 106*  BUN 11  --  12 16  CREATININE 0.89 1.00 0.81 1.06  CALCIUM 8.7*  --  8.9 8.4*  GFRNONAA >60  --  >60 >60  PROT 7.3  --  7.2 6.8  ALBUMIN 4.0  --  3.9 3.5  AST 22  --  20 22  ALT 22  --  19 16  ALKPHOS 59  --  56 48  BILITOT 2.5*  --  1.9* 1.5*    RADIOGRAPHIC STUDIES: I have personally reviewed the radiological images as listed and agreed with the findings in the report. No results found.  ASSESSMENT & PLAN:   [place] of right colon ([place]#Colon cancer stage III [July 2021]-PT4BN2B- high risk. S/p  adjuvant CIV  5FU x12 cycles [finished Jan 18th, 2022]. JAN, 24th- 2023-CT CAP- NED- see  below; s/p  Dr. Bary Castilla [OCT 2022] surveillaince colonocsopy.   #Elevated tumor marker- JAN 20023- 6 [waxing & waning]- Awaiting labs from today. However, JAN 2023- CT negative; see above;  Monitor closely.   # COPD-STABLE-   continue inhalers.    # Port flush- q 2-36M. Will keep for now.     # DISPOSITION: # port flush in 6 W # follow up in 3 months-  MD; labs- cbc/cmp;CEA; port flush; CT CAP prior-;Dr.B      All questions were answered. The patient knows to call the clinic with any problems, questions or concerns.    Cammie Sickle, MD 04/20/2022 7:57 PM

## 2022-04-15 NOTE — Assessment & Plan Note (Addendum)
#  Colon cancer stage III [July 2021]-PT4BN2B- high risk. S/p  adjuvant CIV 5FU x12 cycles [finished Jan 18th, 2022]. JAN, 24th- 2023-CT CAP- NED- see below; s/p  Dr. Bary Castilla [OCT 2022] surveillaince colonocsopy.   #Elevated tumor marker- JAN 20023- 6 [waxing & waning]- Awaiting labs from today. However, JAN 2023- CT negative; see above;  Monitor closely.   # COPD-STABLE-   continue inhalers.    # Port flush- q 2-28M. Will keep for now.     # DISPOSITION: # port flush in 6 W # follow up in 3 months-  MD; labs- cbc/cmp;CEA; port flush; CT CAP prior-;Dr.B

## 2022-04-16 LAB — CEA: CEA: 5.9 ng/mL — ABNORMAL HIGH (ref 0.0–4.7)

## 2022-04-18 ENCOUNTER — Other Ambulatory Visit: Payer: Self-pay

## 2022-04-18 ENCOUNTER — Emergency Department
Admission: EM | Admit: 2022-04-18 | Discharge: 2022-04-18 | Disposition: A | Discharge status: Home / Self Care | Payer: Medicare HMO | Attending: Emergency Medicine | Admitting: Emergency Medicine

## 2022-04-18 DIAGNOSIS — B085 Enteroviral vesicular pharyngitis: Secondary | ICD-10-CM | POA: Diagnosis not present

## 2022-04-18 DIAGNOSIS — Z85038 Personal history of other malignant neoplasm of large intestine: Secondary | ICD-10-CM | POA: Diagnosis not present

## 2022-04-18 DIAGNOSIS — I10 Essential (primary) hypertension: Secondary | ICD-10-CM | POA: Insufficient documentation

## 2022-04-18 DIAGNOSIS — J449 Chronic obstructive pulmonary disease, unspecified: Secondary | ICD-10-CM | POA: Diagnosis not present

## 2022-04-18 DIAGNOSIS — R1310 Dysphagia, unspecified: Secondary | ICD-10-CM | POA: Diagnosis present

## 2022-04-18 DIAGNOSIS — J029 Acute pharyngitis, unspecified: Secondary | ICD-10-CM

## 2022-04-18 LAB — GROUP A STREP BY PCR: Group A Strep by PCR: NOT DETECTED

## 2022-04-18 MED ORDER — LIDOCAINE VISCOUS HCL 2 % MT SOLN
15.0000 mL | Freq: Once | OROMUCOSAL | Status: AC
  Administered 2022-04-18: 15 mL via ORAL
  Filled 2022-04-18: qty 15

## 2022-04-18 MED ORDER — LIDOCAINE VISCOUS HCL 2 % MT SOLN: OROMUCOSAL | 0 refills | Status: DC

## 2022-04-18 MED ORDER — ALUM & MAG HYDROXIDE-SIMETH 200-200-20 MG/5ML PO SUSP
30.0000 mL | Freq: Once | ORAL | Status: AC
  Administered 2022-04-18: 30 mL via ORAL
  Filled 2022-04-18: qty 30

## 2022-04-18 NOTE — ED Triage Notes (Signed)
Pt states that he is having trouble eating, drinking, and speaking from a sore throat x3 days- pt denies cough, fever, and congestion

## 2022-04-18 NOTE — ED Provider Notes (Signed)
   [facility] Provider Note    Event Date/Time   First MD Initiated Contact with Patient 04/18/22 1203     (approximate)  History   Chief Complaint: Sore Throat  HPI  James Hodge is a 76 y.o. male with a past medical history of COPD, hypertension, colon cancer not on chemotherapy, presents to the emergency department for sore throat.  According to the patient for the past 3 days he has had a sore throat and pain with swallowing or talking.  No fever.  No cough.  No chest pain or shortness of breath.  Physical Exam   Triage Vital Signs: ED Triage Vitals  Enc Vitals Group     BP 04/18/22 1141 139/90     Pulse Rate 04/18/22 1141 76     Resp 04/18/22 1141 20     Temp 04/18/22 1141 98.9 F (37.2 C)     Temp Source 04/18/22 1141 Oral     SpO2 04/18/22 1141 93 %     Weight 04/18/22 1142 270 lb (122.5 kg)     Height 04/18/22 1142 '6\' 1"'$  (1.854 m)     Head Circumference --      Peak Flow --      Pain Score 04/18/22 1142 5     Pain Loc --      Pain Edu? --      Excl. in [place]? --     Most recent vital signs: Vitals:   04/18/22 1141  BP: 139/90  Pulse: 76  Resp: 20  Temp: 98.9 F (37.2 C)  SpO2: 93%    General: Awake, no distress.  CV:  Good peripheral perfusion.  Regular rate and rhythm  Resp:  Normal effort.  Equal breath sounds bilaterally.  Abd:  No distention.  Soft, nontender.  No rebound or guarding. Other:  Patient has mild pharyngeal erythema with small exudates/ulcerations to the soft palate most consistent with herpangina.  No significant tonsillar hypertrophy or tonsillar exudates.   ED Results / Procedures / Treatments    MEDICATIONS ORDERED IN ED: Medications  alum & mag hydroxide-simeth (MAALOX/MYLANTA) 200-200-20 MG/5ML suspension 30 mL (30 mLs Oral Given 04/18/22 1216)    And  lidocaine (XYLOCAINE) 2 % viscous mouth solution 15 mL (15 mLs Oral Given 04/18/22 1217)     IMPRESSION / MDM / ASSESSMENT AND PLAN / ED COURSE  I  reviewed the triage vital signs and the nursing notes.  Patient presents emergency department for 3 days of sore throat and pain with swallowing.  On examination patient has mild pharyngeal erythema he also has several lesions to the soft palate most consistent with herpangina.  We will swab for strep as a precaution we will also have the patient gargle and swallow GI cocktail to see if this helps with the patient's symptoms.  Patient agreeable to plan of care.  Strep test is negative.  We will discharge with GI cocktail prescription supportive care discussed return precautions.  Patient will follow-up with his doctor.  FINAL CLINICAL IMPRESSION(S) / ED DIAGNOSES   Sore throat Herpangina  Note:  This document was prepared using Dragon voice recognition software and may include unintentional dictation errors.   Harvest Dark, MD 04/18/22 1326

## 2022-04-20 ENCOUNTER — Encounter: Payer: Self-pay | Admitting: Internal Medicine

## 2022-05-27 ENCOUNTER — Inpatient Hospital Stay: Payer: Medicare HMO | Attending: Internal Medicine

## 2022-05-27 DIAGNOSIS — C182 Malignant neoplasm of ascending colon: Secondary | ICD-10-CM | POA: Insufficient documentation

## 2022-05-27 DIAGNOSIS — Z452 Encounter for adjustment and management of vascular access device: Secondary | ICD-10-CM | POA: Insufficient documentation

## 2022-05-27 DIAGNOSIS — Z95828 Presence of other vascular implants and grafts: Secondary | ICD-10-CM

## 2022-05-27 MED ORDER — SODIUM CHLORIDE 0.9% FLUSH
10.0000 mL | Freq: Once | INTRAVENOUS | Status: AC
  Administered 2022-05-27: 10 mL via INTRAVENOUS
  Filled 2022-05-27: qty 10

## 2022-05-27 MED ORDER — HEPARIN SOD (PORK) LOCK FLUSH 100 UNIT/ML IV SOLN
500.0000 [IU] | Freq: Once | INTRAVENOUS | Status: AC
  Administered 2022-05-27: 500 [IU] via INTRAVENOUS
  Filled 2022-05-27: qty 5

## 2022-05-27 NOTE — Progress Notes (Signed)
Survivorship Care Plan visit completed.  Treatment summary reviewed and given to patient.  ASCO answers booklet reviewed and given to patient.  CARE program and Cancer Transitions discussed with patient along with other resources cancer center offers to patients and caregivers.  Patient verbalized understanding.    

## 2022-06-15 ENCOUNTER — Other Ambulatory Visit: Payer: Self-pay

## 2022-06-16 ENCOUNTER — Other Ambulatory Visit: Payer: Self-pay

## 2022-07-16 ENCOUNTER — Ambulatory Visit
Admission: RE | Admit: 2022-07-16 | Discharge: 2022-07-16 | Disposition: A | Discharge status: Home / Self Care | Payer: Medicare HMO | Source: Ambulatory Visit | Attending: Internal Medicine | Admitting: Internal Medicine

## 2022-07-16 DIAGNOSIS — C182 Malignant neoplasm of ascending colon: Secondary | ICD-10-CM | POA: Diagnosis present

## 2022-07-16 LAB — POCT I-STAT CREATININE: Creatinine, Ser: 0.9 mg/dL (ref 0.61–1.24)

## 2022-07-16 MED ORDER — IOHEXOL 300 MG/ML  SOLN
100.0000 mL | Freq: Once | INTRAMUSCULAR | Status: AC | PRN
  Administered 2022-07-16: 100 mL via INTRAVENOUS

## 2022-07-20 ENCOUNTER — Inpatient Hospital Stay (HOSPITAL_BASED_OUTPATIENT_CLINIC_OR_DEPARTMENT_OTHER): Payer: Medicare HMO | Admitting: Nurse Practitioner

## 2022-07-20 ENCOUNTER — Ambulatory Visit: Payer: Medicare HMO | Admitting: Internal Medicine

## 2022-07-20 ENCOUNTER — Other Ambulatory Visit: Payer: Medicare HMO

## 2022-07-20 ENCOUNTER — Inpatient Hospital Stay: Payer: Medicare HMO | Attending: Internal Medicine

## 2022-07-20 ENCOUNTER — Other Ambulatory Visit: Payer: Self-pay

## 2022-07-20 VITALS — BP 136/91 | HR 60 | Temp 97.2°F | Resp 16 | Wt 243.0 lb

## 2022-07-20 DIAGNOSIS — J449 Chronic obstructive pulmonary disease, unspecified: Secondary | ICD-10-CM | POA: Diagnosis not present

## 2022-07-20 DIAGNOSIS — Z85038 Personal history of other malignant neoplasm of large intestine: Secondary | ICD-10-CM

## 2022-07-20 DIAGNOSIS — Z08 Encounter for follow-up examination after completed treatment for malignant neoplasm: Secondary | ICD-10-CM

## 2022-07-20 DIAGNOSIS — Z87891 Personal history of nicotine dependence: Secondary | ICD-10-CM | POA: Insufficient documentation

## 2022-07-20 DIAGNOSIS — C182 Malignant neoplasm of ascending colon: Secondary | ICD-10-CM | POA: Diagnosis present

## 2022-07-20 DIAGNOSIS — Z79899 Other long term (current) drug therapy: Secondary | ICD-10-CM | POA: Diagnosis not present

## 2022-07-20 LAB — COMPREHENSIVE METABOLIC PANEL
ALT: 19 U/L (ref 0–44)
AST: 22 U/L (ref 15–41)
Albumin: 3.7 g/dL (ref 3.5–5.0)
Alkaline Phosphatase: 50 U/L (ref 38–126)
Anion gap: 8 (ref 5–15)
BUN: 10 mg/dL (ref 8–23)
CO2: 28 mmol/L (ref 22–32)
Calcium: 8.8 mg/dL — ABNORMAL LOW (ref 8.9–10.3)
Chloride: 104 mmol/L (ref 98–111)
Creatinine, Ser: 0.74 mg/dL (ref 0.61–1.24)
GFR, Estimated: >60 mL/min (ref >60)
Glucose, Bld: 105 mg/dL — ABNORMAL HIGH (ref 70–99)
Potassium: 3 mmol/L — ABNORMAL LOW (ref 3.5–5.1)
Sodium: 140 mmol/L (ref 135–145)
Total Bilirubin: 1.9 mg/dL — ABNORMAL HIGH (ref 0.3–1.2)
Total Protein: 6.9 g/dL (ref 6.5–8.1)

## 2022-07-20 LAB — CBC WITH DIFFERENTIAL/PLATELET
Abs Immature Granulocytes: 0.03 10*3/uL (ref 0.00–0.07)
Basophils Absolute: 0 10*3/uL (ref 0.0–0.1)
Basophils Relative: 0 %
Eosinophils Absolute: 0.4 10*3/uL (ref 0.0–0.5)
Eosinophils Relative: 5 %
HCT: 43.4 % (ref 39.0–52.0)
Hemoglobin: 14.8 g/dL (ref 13.0–17.0)
Immature Granulocytes: 0 %
Lymphocytes Relative: 32 %
Lymphs Abs: 2.5 10*3/uL (ref 0.7–4.0)
MCH: 31.2 pg (ref 26.0–34.0)
MCHC: 34.1 g/dL (ref 30.0–36.0)
MCV: 91.4 fL (ref 80.0–100.0)
Monocytes Absolute: 0.6 10*3/uL (ref 0.1–1.0)
Monocytes Relative: 8 %
Neutro Abs: 4.2 10*3/uL (ref 1.7–7.7)
Neutrophils Relative %: 55 %
Platelets: 248 10*3/uL (ref 150–400)
RBC: 4.75 MIL/uL (ref 4.22–5.81)
RDW: 12.6 % (ref 11.5–15.5)
WBC: 7.6 10*3/uL (ref 4.0–10.5)
nRBC: 0 % (ref 0.0–0.2)

## 2022-07-20 NOTE — Progress Notes (Signed)
Returns for follow-up of colon cancer and CT scan results. Pt reports he is dealing with "sinus issues", otherwise, no further concerns.

## 2022-07-20 NOTE — Progress Notes (Signed)
[place] NOTE  Patient Care Team: Albina Billet, MD as PCP - General (Internal Medicine) Clent Jacks, RN as Oncology Nurse Navigator Cammie Sickle, MD as Consulting Physician (Hematology and Oncology) Bary Castilla Forest Gleason, MD as Consulting Physician (General Surgery)  CHIEF COMPLAINTS/PURPOSE OF CONSULTATION: Colon cancer  #  Oncology History Overview Note  # July 2021- RIGHT COLON ADENO CARCINOMA- Dr.Byrnett s/p right hemicolectomy- pT4b N2b- STAGE III [high risk]; MSI-STABLE.   # AUG 10th, 2021- FOLFOX [adjuvant];  OCT 2022- surveillance colonocsopy. s/p  Dr. Bary Castilla; repeat in 3 years  -------------------------------------------------------------------------------------------   - INVASIVE MODERATELY DIFFERENTIATED ADENOCARCINOMA.  - TEN OF FIFTY-TWO LYMPH NODES POSITIVE FOR METASTATIC ADENOCARCINOMA  (10/52).  - SINGLE INCIDENTAL TUBULOVILLOUS ADENOMA.  - THREE INCIDENTAL TUBULAR ADENOMAS.  - SEE CANCER SUMMARY.   CANCER CASE SUMMARY: COLON AND RECTUM  Procedure: Right hemicolectomy  Tumor Site: Cecum  Tumor Size: Greatest dimension: 12.2 cm  Macroscopic Tumor Perforation: Present  Histologic Type: Adenocarcinoma  Histologic Grade: G2: Moderately differentiated  Tumor Extension: Tumor directly invades adjacent structures (terminal  ileum)  Margins: All margins are uninvolved by invasive carcinoma, high-grade  dysplasia/intramucosal adenocarcinoma, and low-grade dysplasia  Treatment Effect: No known presurgical therapy  Lymphovascular Invasion: Present  Perineural Invasion: Present  Tumor Deposits: Present: Multiple  Regional Lymph Nodes:       Number of Lymph Nodes Involved: 10       Number of Lymph Nodes Examined: 38  -  # COPD- [Dr.Fleming];   # SURVIVORSHIP:   # GENETICS:   DIAGNOSIS: colon cancer  STAGE:   III      ;  GOALS:cure  CURRENT/MOST RECENT THERAPY : FOLFOX     Cancer of right colon ([place])  05/20/2020  Initial Diagnosis   Cancer of right colon ([place])   07/02/2020 - 12/12/2020 Chemotherapy   Patient is on Treatment Plan : COLORECTAL 5FU q14d x 6 months     12/08/2020 Cancer Staging   Staging form: Colon and Rectum, AJCC 8th Edition - Clinical: Stage IIIC (cT4b, cN2b, cM0) - Signed by Cammie Sickle, MD on 12/08/2020     HISTORY OF PRESENTING ILLNESS: Alone.  Walks independently. James Hodge 76 y.o.  male high risk stage III colon cancer currently s/p adjuvant 5-FU continuous infusion-on surveillance is here for follow-up.   Patient has not had any hospitalizations. Denies unintentional weight loss but has lost weight. Denies any nausea vomiting abdominal pain.  No blood in stools or black-colored stools. He has a chihuahua that sleeps with him.   Review of Systems  Constitutional:  Positive for malaise/fatigue. Negative for chills, fever and weight loss.  HENT:  Negative for hearing loss, nosebleeds, sore throat and tinnitus.   Eyes:  Negative for blurred vision and double vision.  Respiratory:  Negative for cough, hemoptysis, shortness of breath and wheezing.   Cardiovascular:  Negative for chest pain, palpitations and leg swelling.  Gastrointestinal:  Negative for abdominal pain, blood in stool, constipation, diarrhea, melena, nausea and vomiting.  Genitourinary:  Negative for dysuria and urgency.  Musculoskeletal:  Negative for back pain, falls, joint pain and myalgias.  Skin:  Negative for itching and rash.  Neurological:  Negative for dizziness, tingling, sensory change, loss of consciousness, weakness and headaches.  Endo/Heme/Allergies:  Negative for environmental allergies. Does not bruise/bleed easily.  Psychiatric/Behavioral:  Negative for depression. The patient is not nervous/anxious and does not have insomnia.      MEDICAL HISTORY:  Past Medical  History:  Diagnosis Date   Cancer ([place])    COPD (chronic obstructive pulmonary disease) ([place])    Hypertension      SURGICAL HISTORY: Past Surgical History:  Procedure Laterality Date   CHOLECYSTECTOMY     Around 2001   COLONOSCOPY WITH PROPOFOL N/A 04/12/2020   Procedure: COLONOSCOPY WITH PROPOFOL;  Surgeon: Robert Bellow, MD;  Location: [facility];  Service: Endoscopy;  Laterality: N/A;   COLONOSCOPY WITH PROPOFOL N/A 04/24/2020   Procedure: COLONOSCOPY WITH PROPOFOL;  Surgeon: Robert Bellow, MD;  Location: [facility];  Service: Endoscopy;  Laterality: N/A;   COLONOSCOPY WITH PROPOFOL N/A 09/10/2021   Procedure: COLONOSCOPY WITH PROPOFOL;  Surgeon: Robert Bellow, MD;  Location: [facility];  Service: Endoscopy;  Laterality: N/A;   ESOPHAGOGASTRODUODENOSCOPY (EGD) WITH PROPOFOL N/A 04/12/2020   Procedure: ESOPHAGOGASTRODUODENOSCOPY (EGD) WITH PROPOFOL;  Surgeon: Robert Bellow, MD;  Location: [facility] END[facility];  Service: Endoscopy;  Laterality: N/A;   EYE SURGERY Bilateral    cataract   LAPAROSCOPIC PARTIAL COLECTOMY Right 05/20/2020   Procedure: LAPAROSCOPIC PARTIAL COLECTOMY;  Surgeon: Robert Bellow, MD;  Location: [facility] ORS;  Service: General;  Laterality: Right;  Floyce Stakes, RNFA to assist   PORTACATH PLACEMENT Left 06/19/2020   Procedure: INSERTION PORT-A-CATH;  Surgeon: Robert Bellow, MD;  Location: [facility] OR[facility];  Service: General;  Laterality: Left;    SOCIAL HISTORY: Social History   Socioeconomic History   Marital status: Married    Spouse name: Not on file   Number of children: Not on file   Years of education: Not on file   Highest education level: Not on file  Occupational History   Not on file  Tobacco Use   Smoking status: Former    Years: 3.00    Types: Cigarettes    Quit date: 11/23/1968    Years since quitting: 53.6   Smokeless tobacco: Never  Vaping Use   Vaping Use: Never used  Substance and Sexual Activity   Alcohol use: Not Currently   Drug use: Never   Sexual activity: Not on file  Other Topics Concern   Not on file  Social  History Narrative   Lives in [place]; smoked- quit in 70s; beer social; 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; grandson    Social Determinants of Health   Financial Resource Strain: Not on file  Food Insecurity: Not on file  Transportation Needs: Not on file  Physical Activity: Not on file  Stress: Not on file  Social Connections: Not on file  Intimate Partner Violence: Not on file    FAMILY HISTORY: Family History  Problem Relation Age of Onset   Multiple sclerosis Brother     ALLERGIES:  has No Known Allergies.  MEDICATIONS:  Current Outpatient Medications  Medication Sig Dispense Refill   BREO ELLIPTA 100-25 MCG/INH AEPB Inhale 1 puff into the lungs at bedtime.     Butalbital-APAP-Caffeine 50-300-40 MG CAPS Take 1-2 tablets by mouth every 8 (eight) hours as needed.     fexofenadine (ALLEGRA) 180 MG tablet Take 180 mg by mouth daily.     fexofenadine (ALLEGRA) 180 MG tablet Take 1 tablet by mouth daily.     GI Cocktail (alum & mag hydroxide-simethicone/lidocaine)oral mixture Each dose to contain 77mL Maalox and 36mL viscous lidocaine 300 mL 0   lidocaine-prilocaine (EMLA) cream Apply 1 application. topically as needed. 30 g 3   lisinopril (ZESTRIL) 10 MG tablet Take 10 mg by mouth daily.     ondansetron (ZOFRAN) 8  MG tablet One pill every 8 hours as needed for nausea/vomitting. 40 tablet 1   potassium chloride SA (KLOR-CON M20) 20 MEQ tablet TAKE 1 TABLET BY MOUTH TWICE A DAY 60 tablet 3   prochlorperazine (COMPAZINE) 10 MG tablet Take 1 tablet (10 mg total) by mouth every 6 (six) hours as needed for nausea or vomiting. 40 tablet 1   SYMBICORT 160-4.5 MCG/ACT inhaler Inhale into the lungs.     tiotropium (SPIRIVA) 18 MCG inhalation capsule Place 18 mcg into inhaler and inhale at bedtime.      No current facility-administered medications for this visit.   Facility-Administered Medications Ordered in Other Visits  Medication Dose Route Frequency Provider Last Rate Last Admin    heparin lock flush 100 unit/mL  500 Units Intravenous Once Cammie Sickle, MD        PHYSICAL EXAMINATION: ECOG PERFORMANCE STATUS: 0 - Asymptomatic  Vitals:   07/20/22 1106  BP: (!) 136/91  Pulse: 60  Resp: 16  Temp: (!) 97.2 F (36.2 C)  SpO2: 96%   Filed Weights   07/20/22 1106  Weight: 243 lb (110.2 kg)    Physical Exam HENT:     Head: Normocephalic and atraumatic.     Mouth/Throat:     Pharynx: No oropharyngeal exudate.  Eyes:     Pupils: Pupils are equal, round, and reactive to light.  Cardiovascular:     Rate and Rhythm: Normal rate and regular rhythm.  Pulmonary:     Effort: No respiratory distress.     Breath sounds: No wheezing.     Comments: Decreased breath sounds Abdominal:     General: Bowel sounds are normal. There is no distension.     Palpations: Abdomen is soft. There is no mass.     Tenderness: There is no abdominal tenderness. There is no guarding or rebound.  Musculoskeletal:        General: No tenderness. Normal range of motion.     Cervical back: Normal range of motion and neck supple.  Skin:    General: Skin is warm.  Neurological:     Mental Status: He is alert and oriented to person, place, and time.  Psychiatric:        Mood and Affect: Affect normal.     LABORATORY DATA:  I have reviewed the data as listed Lab Results  Component Value Date   WBC 7.6 07/20/2022   HGB 14.8 07/20/2022   HCT 43.4 07/20/2022   MCV 91.4 07/20/2022   PLT 248 07/20/2022   Recent Labs    12/17/21 0909 04/15/22 1015 07/16/22 0850 07/20/22 1039  NA 140 141  --  140  K 3.4* 3.8  --  3.0*  CL 105 107  --  104  CO2 29 29  --  28  GLUCOSE 109* 106*  --  105*  BUN 12 16  --  10  CREATININE 0.81 1.06 0.90 0.74  CALCIUM 8.9 8.4*  --  8.8*  GFRNONAA >60 >60  --  >60  PROT 7.2 6.8  --  6.9  ALBUMIN 3.9 3.5  --  3.7  AST 20 22  --  22  ALT 19 16  --  19  ALKPHOS 56 48  --  50  BILITOT 1.9* 1.5*  --  1.9*   No results found for:  "CEA"  Component Ref Range & Units 3 mo ago (04/15/22) 7 mo ago (12/17/21) 11 mo ago (08/20/21) 1 yr ago (05/21/21) 1 yr ago (03/25/21)  1 yr ago (01/21/21) 1 yr ago (11/25/20)  CEA 0.0 - 4.7 ng/mL 5.9 High   6.6 High  CM  10.4 High  CM  9.5 High  CM  8.4 High  CM  7.9 High  CM  15.3 High     RADIOGRAPHIC STUDIES: I have personally reviewed the radiological images as listed and agreed with the findings in the report. CT CHEST ABDOMEN PELVIS W CONTRAST  Result Date: 07/17/2022 CLINICAL DATA:  Colon cancer restaging. Prior partial colectomy and prior chemotherapy. * Tracking Code: BO * EXAM: CT CHEST, ABDOMEN, AND PELVIS WITH CONTRAST TECHNIQUE: Multidetector CT imaging of the chest, abdomen and pelvis was performed following the standard protocol during bolus administration of intravenous contrast. RADIATION DOSE REDUCTION: This exam was performed according to the departmental dose-optimization program which includes automated exposure control, adjustment of the mA and/or kV according to patient size and/or use of iterative reconstruction technique. CONTRAST:  192m OMNIPAQUE IOHEXOL 300 MG/ML  SOLN COMPARISON:  12/16/2021 FINDINGS: CT CHEST FINDINGS Cardiovascular: Left Port-A-Cath tip: Upper SVC. Coronary, aortic arch, and branch vessel atherosclerotic vascular disease. Mediastinum/Nodes: No pathologic adenopathy. Contrast medium in the distal esophagus compatible with dysmotility or reflux. Lungs/Pleura: Calcified granuloma in the right lower lobe on image 94 series 4. Mild scarring in the superior segment right lower lobe. No worrisome lung lesions observed. Musculoskeletal: Unremarkable CT ABDOMEN PELVIS FINDINGS Hepatobiliary: Absent gallbladder. No significant hepatic lesion is observed. 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 This is a benign lesion and does not require further follow up. 1.7 by 1.9 cm Bosniak  category 1 cyst of the right kidney lower pole on image 85 series 3. 1.9 by 1.9 cm benign left kidney upper pole cyst on image 68 series 3. Mild left mid kidney scarring. These renal lesions do not require any follow up. Stomach/Bowel: Right hemicolectomy. Narrowed splenic flexure on image 71 of series 3 with local wall thickening probably due to contraction such as peristalsis, this area looks normal on 12/16/2021. Vascular/Lymphatic: Atherosclerosis is present, including aortoiliac atherosclerotic disease. No pathologic adenopathy. Reproductive: Prostatomegaly. Other: Hazy density in the right small bowel mesentery, cannot exclude low-grade sclerosing mesenteritis. Musculoskeletal: Unremarkable IMPRESSION: 1. No findings of active malignancy. 2. Other imaging findings of potential clinical significance: Aortic Atherosclerosis (ICD10-I70.0). Coronary atherosclerosis. Esophageal dysmotility versus reflux. 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 Right hemicolectomy. 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 Prostatomegaly. Electronically Signed   By: WVan ClinesM.D.   On: 07/17/2022 12:10    ASSESSMENT & PLAN:   No problem-specific Assessment & Plan notes found for this encounter.  Cancer of right colon ([facility] # Colon cancer- stage III [July 2021]-pT4B N2B- high risk. S/p  adjuvant CIV 5FU x 12 cycles [finished Jan 18th, 2022]. CT C/A/P 07/17/22 was independently reviewed: No evidence of active malignancy, progression, or recurrence. He had surveillance colonoscopy with Dr. BBary Castillain October 2022. He will need surveillance colonoscopy in October 2025. He clinically, remains asymptomatic. CEA today is pending. Was previously rising but has steadily decreased over the past year and was 5.9 in May 2023. Close surveillance. Clinically, asymptomatic.    # COPD-STABLE-   continue inhalers.     # Port flush- q 2-13M. Will keep for now. Declined port flush today. Will perform in 6 weeks.     # DISPOSITION: 6 weeks- port flush 3 mo- CT C/A/P Few days later- port/lab (cbc, cmp, cea), Dr. BRogue Bussing la  All questions were answered. The patient knows to call the clinic with any problems, questions or concerns.   Verlon Au, NP 07/20/2022

## 2022-07-21 LAB — CEA: CEA: 6 ng/mL — ABNORMAL HIGH (ref 0.0–4.7)

## 2022-08-31 ENCOUNTER — Inpatient Hospital Stay: Payer: Medicare HMO | Attending: Internal Medicine

## 2022-08-31 DIAGNOSIS — Z95828 Presence of other vascular implants and grafts: Secondary | ICD-10-CM

## 2022-08-31 DIAGNOSIS — C182 Malignant neoplasm of ascending colon: Secondary | ICD-10-CM | POA: Insufficient documentation

## 2022-08-31 DIAGNOSIS — Z452 Encounter for adjustment and management of vascular access device: Secondary | ICD-10-CM | POA: Diagnosis not present

## 2022-08-31 MED ORDER — SODIUM CHLORIDE 0.9% FLUSH
10.0000 mL | Freq: Once | INTRAVENOUS | Status: AC
  Administered 2022-08-31: 10 mL via INTRAVENOUS
  Filled 2022-08-31: qty 10

## 2022-08-31 MED ORDER — HEPARIN SOD (PORK) LOCK FLUSH 100 UNIT/ML IV SOLN
500.0000 [IU] | Freq: Once | INTRAVENOUS | Status: AC
  Administered 2022-08-31: 500 [IU] via INTRAVENOUS
  Filled 2022-08-31: qty 5

## 2022-10-21 ENCOUNTER — Ambulatory Visit
Admission: RE | Admit: 2022-10-21 | Discharge: 2022-10-21 | Disposition: A | Discharge status: Home / Self Care | Payer: Medicare HMO | Source: Ambulatory Visit | Attending: Nurse Practitioner | Admitting: Nurse Practitioner

## 2022-10-21 DIAGNOSIS — Z08 Encounter for follow-up examination after completed treatment for malignant neoplasm: Secondary | ICD-10-CM | POA: Diagnosis present

## 2022-10-21 DIAGNOSIS — Z85038 Personal history of other malignant neoplasm of large intestine: Secondary | ICD-10-CM | POA: Insufficient documentation

## 2022-10-21 DIAGNOSIS — C182 Malignant neoplasm of ascending colon: Secondary | ICD-10-CM | POA: Insufficient documentation

## 2022-10-21 LAB — POCT I-STAT CREATININE: Creatinine, Ser: 0.9 mg/dL (ref 0.61–1.24)

## 2022-10-21 MED ORDER — IOHEXOL 300 MG/ML  SOLN
100.0000 mL | Freq: Once | INTRAMUSCULAR | Status: AC | PRN
  Administered 2022-10-21: 100 mL via INTRAVENOUS

## 2022-10-26 ENCOUNTER — Encounter: Payer: Self-pay | Admitting: Internal Medicine

## 2022-10-26 ENCOUNTER — Inpatient Hospital Stay: Payer: Medicare HMO | Attending: Internal Medicine

## 2022-10-26 ENCOUNTER — Encounter: Payer: Self-pay | Admitting: *Deleted

## 2022-10-26 ENCOUNTER — Inpatient Hospital Stay (HOSPITAL_BASED_OUTPATIENT_CLINIC_OR_DEPARTMENT_OTHER): Payer: Medicare HMO | Admitting: Internal Medicine

## 2022-10-26 VITALS — BP 146/80 | HR 69 | Temp 97.9°F | Resp 18 | Wt 238.7 lb

## 2022-10-26 DIAGNOSIS — J439 Emphysema, unspecified: Secondary | ICD-10-CM | POA: Diagnosis not present

## 2022-10-26 DIAGNOSIS — Z08 Encounter for follow-up examination after completed treatment for malignant neoplasm: Secondary | ICD-10-CM | POA: Diagnosis present

## 2022-10-26 DIAGNOSIS — C182 Malignant neoplasm of ascending colon: Secondary | ICD-10-CM

## 2022-10-26 DIAGNOSIS — I272 Pulmonary hypertension, unspecified: Secondary | ICD-10-CM | POA: Diagnosis not present

## 2022-10-26 DIAGNOSIS — Z85038 Personal history of other malignant neoplasm of large intestine: Secondary | ICD-10-CM | POA: Diagnosis present

## 2022-10-26 DIAGNOSIS — Z95828 Presence of other vascular implants and grafts: Secondary | ICD-10-CM | POA: Diagnosis not present

## 2022-10-26 DIAGNOSIS — Z452 Encounter for adjustment and management of vascular access device: Secondary | ICD-10-CM | POA: Diagnosis not present

## 2022-10-26 LAB — COMPREHENSIVE METABOLIC PANEL
ALT: 15 U/L (ref 0–44)
AST: 20 U/L (ref 15–41)
Albumin: 3.6 g/dL (ref 3.5–5.0)
Alkaline Phosphatase: 50 U/L (ref 38–126)
Anion gap: 9 (ref 5–15)
BUN: 12 mg/dL (ref 8–23)
CO2: 29 mmol/L (ref 22–32)
Calcium: 8.4 mg/dL — ABNORMAL LOW (ref 8.9–10.3)
Chloride: 106 mmol/L (ref 98–111)
Creatinine, Ser: 0.87 mg/dL (ref 0.61–1.24)
GFR, Estimated: >60 mL/min (ref >60)
Glucose, Bld: 111 mg/dL — ABNORMAL HIGH (ref 70–99)
Potassium: 3 mmol/L — ABNORMAL LOW (ref 3.5–5.1)
Sodium: 144 mmol/L (ref 135–145)
Total Bilirubin: 1.8 mg/dL — ABNORMAL HIGH (ref 0.3–1.2)
Total Protein: 6.7 g/dL (ref 6.5–8.1)

## 2022-10-26 LAB — CBC
HCT: 41.2 % (ref 39.0–52.0)
Hemoglobin: 13.9 g/dL (ref 13.0–17.0)
MCH: 31.2 pg (ref 26.0–34.0)
MCHC: 33.7 g/dL (ref 30.0–36.0)
MCV: 92.6 fL (ref 80.0–100.0)
Platelets: 190 10*3/uL (ref 150–400)
RBC: 4.45 MIL/uL (ref 4.22–5.81)
RDW: 13.2 % (ref 11.5–15.5)
WBC: 9.1 10*3/uL (ref 4.0–10.5)
nRBC: 0 % (ref 0.0–0.2)

## 2022-10-26 MED ORDER — SODIUM CHLORIDE 0.9% FLUSH
10.0000 mL | Freq: Once | INTRAVENOUS | Status: AC
  Administered 2022-10-26: 10 mL via INTRAVENOUS
  Filled 2022-10-26: qty 10

## 2022-10-26 MED ORDER — HEPARIN SOD (PORK) LOCK FLUSH 100 UNIT/ML IV SOLN
500.0000 [IU] | Freq: Once | INTRAVENOUS | Status: AC
  Administered 2022-10-26: 500 [IU] via INTRAVENOUS
  Filled 2022-10-26: qty 5

## 2022-10-26 NOTE — Progress Notes (Signed)
[place] NOTE  Patient Care Team: Albina Billet, MD as PCP - General (Internal Medicine) Clent Jacks, RN as Oncology Nurse Navigator Cammie Sickle, MD as Consulting Physician (Hematology and Oncology) Bary Castilla Forest Gleason, MD as Consulting Physician (General Surgery)  CHIEF COMPLAINTS/PURPOSE OF CONSULTATION: Colon cancer  #  Oncology History Overview Note  # July 2021- RIGHT COLON ADENO CARCINOMA- Dr.Byrnett s/p right hemicolectomy- pT4b N2b- STAGE III [high risk]; MSI-STABLE.   # AUG 10th, 2021- FOLFOX [adjuvant];  OCT 2022- surveillance colonocsopy. s/p  Dr. Bary Castilla; repeat in 3 years  -------------------------------------------------------------------------------------------   - INVASIVE MODERATELY DIFFERENTIATED ADENOCARCINOMA.  - TEN OF FIFTY-TWO LYMPH NODES POSITIVE FOR METASTATIC ADENOCARCINOMA  (10/52).  - SINGLE INCIDENTAL TUBULOVILLOUS ADENOMA.  - THREE INCIDENTAL TUBULAR ADENOMAS.  - SEE CANCER SUMMARY.   CANCER CASE SUMMARY: COLON AND RECTUM  Procedure: Right hemicolectomy  Tumor Site: Cecum  Tumor Size: Greatest dimension: 12.2 cm  Macroscopic Tumor Perforation: Present  Histologic Type: Adenocarcinoma  Histologic Grade: G2: Moderately differentiated  Tumor Extension: Tumor directly invades adjacent structures (terminal  ileum)  Margins: All margins are uninvolved by invasive carcinoma, high-grade  dysplasia/intramucosal adenocarcinoma, and low-grade dysplasia  Treatment Effect: No known presurgical therapy  Lymphovascular Invasion: Present  Perineural Invasion: Present  Tumor Deposits: Present: Multiple  Regional Lymph Nodes:       Number of Lymph Nodes Involved: 10       Number of Lymph Nodes Examined: 35  -  # COPD- [Dr.Fleming];   # SURVIVORSHIP:   # GENETICS:   DIAGNOSIS: colon cancer  STAGE:   III      ;  GOALS:cure  CURRENT/MOST RECENT THERAPY : FOLFOX     Cancer of right colon ([place])  05/20/2020  Initial Diagnosis   Cancer of right colon ([place])   07/02/2020 - 12/12/2020 Chemotherapy   Patient is on Treatment Plan : COLORECTAL 5FU q14d x 6 months     12/08/2020 Cancer Staging   Staging form: Colon and Rectum, AJCC 8th Edition - Clinical: Stage IIIC (cT4b, cN2b, cM0) - Signed by Cammie Sickle, MD on 12/08/2020      HISTORY OF PRESENTING ILLNESS: Alone.  Walks independently.  James Hodge 76 y.o.  male high risk stage III colon cancer currently s/p adjuvant 5-FU continuous infusion-on surveillance is here for follow-up; and the results of the CT scan.   Patient has not had any hospitalizations.  Denies any weight loss.  Denies any nausea vomiting abdominal pain.  No blood in stools or black-colored stools.    Review of Systems  Constitutional:  Negative for chills, diaphoresis, fever and malaise/fatigue.  HENT:  Negative for nosebleeds and sore throat.   Eyes:  Negative for double vision.  Respiratory:  Negative for cough, hemoptysis, sputum production and wheezing.   Cardiovascular:  Negative for chest pain, palpitations and orthopnea.  Gastrointestinal:  Negative for abdominal pain, blood in stool, constipation, diarrhea, heartburn, melena, nausea and vomiting.  Genitourinary:  Negative for dysuria, frequency and urgency.  Musculoskeletal:  Negative for back pain and joint pain.  Skin: Negative.  Negative for itching and rash.  Neurological:  Negative for dizziness, tingling, focal weakness, weakness and headaches.  Endo/Heme/Allergies:  Does not bruise/bleed easily.  Psychiatric/Behavioral:  Negative for depression. The patient is not nervous/anxious and does not have insomnia.      MEDICAL HISTORY:  Past Medical History:  Diagnosis Date   Cancer [facility])    COPD (chronic obstructive pulmonary disease) ([place])  Hypertension     SURGICAL HISTORY: Past Surgical History:  Procedure Laterality Date   CHOLECYSTECTOMY     Around 2001   COLONOSCOPY WITH PROPOFOL N/A  04/12/2020   Procedure: COLONOSCOPY WITH PROPOFOL;  Surgeon: Robert Bellow, MD;  Location: [facility] ENDOSCOPY;  Service: Endoscopy;  Laterality: N/A;   COLONOSCOPY WITH PROPOFOL N/A 04/24/2020   Procedure: COLONOSCOPY WITH PROPOFOL;  Surgeon: Robert Bellow, MD;  Location: [facility] ENDOSCOPY;  Service: Endoscopy;  Laterality: N/A;   COLONOSCOPY WITH PROPOFOL N/A 09/10/2021   Procedure: COLONOSCOPY WITH PROPOFOL;  Surgeon: Robert Bellow, MD;  Location: [facility] ENDOSCOPY;  Service: Endoscopy;  Laterality: N/A;   ESOPHAGOGASTRODUODENOSCOPY (EGD) WITH PROPOFOL N/A 04/12/2020   Procedure: ESOPHAGOGASTRODUODENOSCOPY (EGD) WITH PROPOFOL;  Surgeon: Robert Bellow, MD;  Location: [facility] ENDOSCOPY;  Service: Endoscopy;  Laterality: N/A;   EYE SURGERY Bilateral    cataract   LAPAROSCOPIC PARTIAL COLECTOMY Right 05/20/2020   Procedure: LAPAROSCOPIC PARTIAL COLECTOMY;  Surgeon: Robert Bellow, MD;  Location: [facility] ORS;  Service: General;  Laterality: Right;  Floyce Stakes, RNFA to assist   PORTACATH PLACEMENT Left 06/19/2020   Procedure: INSERTION PORT-A-CATH;  Surgeon: Robert Bellow, MD;  Location: [facility] ORS;  Service: General;  Laterality: Left;    SOCIAL HISTORY: Social History   Socioeconomic History   Marital status: Married    Spouse name: Not on file   Number of children: Not on file   Years of education: Not on file   Highest education level: Not on file  Occupational History   Not on file  Tobacco Use   Smoking status: Former    Years: 3.00    Types: Cigarettes    Quit date: 11/23/1968    Years since quitting: 53.9   Smokeless tobacco: Never  Vaping Use   Vaping Use: Never used  Substance and Sexual Activity   Alcohol use: Not Currently   Drug use: Never   Sexual activity: Not on file  Other Topics Concern   Not on file  Social History Narrative   Lives in [place]; smoked- quit in 70s; beer social; 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; grandson    Social Determinants of Health    Financial Resource Strain: Not on file  Food Insecurity: Not on file  Transportation Needs: Not on file  Physical Activity: Not on file  Stress: Not on file  Social Connections: Not on file  Intimate Partner Violence: Not on file    FAMILY HISTORY: Family History  Problem Relation Age of Onset   Multiple sclerosis Brother     ALLERGIES:  has No Known Allergies.  MEDICATIONS:  Current Outpatient Medications  Medication Sig Dispense Refill   BREO ELLIPTA 100-25 MCG/INH AEPB Inhale 1 puff into the lungs at bedtime.     Butalbital-APAP-Caffeine 50-300-40 MG CAPS Take 1-2 tablets by mouth every 8 (eight) hours as needed.     fexofenadine (ALLEGRA) 180 MG tablet Take 180 mg by mouth daily.     lidocaine-prilocaine (EMLA) cream Apply 1 application. topically as needed. 30 g 3   lisinopril (ZESTRIL) 10 MG tablet Take 10 mg by mouth daily.     potassium chloride SA (KLOR-CON M20) 20 MEQ tablet TAKE 1 TABLET BY MOUTH TWICE A DAY 60 tablet 3   SYMBICORT 160-4.5 MCG/ACT inhaler Inhale into the lungs.     tiotropium (SPIRIVA) 18 MCG inhalation capsule Place 18 mcg into inhaler and inhale at bedtime.      fexofenadine (ALLEGRA) 180 MG tablet Take 1  tablet by mouth daily. (Patient not taking: Reported on 10/26/2022)     GI Cocktail (alum & mag hydroxide-simethicone/lidocaine)oral mixture Each dose to contain 72m Maalox and 147mviscous lidocaine (Patient not taking: Reported on 10/26/2022) 300 mL 0   ondansetron (ZOFRAN) 8 MG tablet One pill every 8 hours as needed for nausea/vomitting. (Patient not taking: Reported on 10/26/2022) 40 tablet 1   prochlorperazine (COMPAZINE) 10 MG tablet Take 1 tablet (10 mg total) by mouth every 6 (six) hours as needed for nausea or vomiting. (Patient not taking: Reported on 10/26/2022) 40 tablet 1   No current facility-administered medications for this visit.   Facility-Administered Medications Ordered in Other Visits  Medication Dose Route Frequency Provider  Last Rate Last Admin   heparin lock flush 100 unit/mL  500 Units Intravenous Once BrCammie SickleMD          .  PHYSICAL EXAMINATION: ECOG PERFORMANCE STATUS: 0 - Asymptomatic  Vitals:   10/26/22 1400  BP: (!) 146/80  Pulse: 69  Resp: 18  Temp: 97.9 F (36.6 C)   Filed Weights   10/26/22 1400  Weight: 238 lb 11.2 oz (108.3 kg)     Physical Exam HENT:     Head: Normocephalic and atraumatic.     Mouth/Throat:     Pharynx: No oropharyngeal exudate.  Eyes:     Pupils: Pupils are equal, round, and reactive to light.  Cardiovascular:     Rate and Rhythm: Normal rate and regular rhythm.  Pulmonary:     Effort: No respiratory distress.     Breath sounds: No wheezing.     Comments: Decreased breath sounds Abdominal:     General: Bowel sounds are normal. There is no distension.     Palpations: Abdomen is soft. There is no mass.     Tenderness: There is no abdominal tenderness. There is no guarding or rebound.  Musculoskeletal:        General: No tenderness. Normal range of motion.     Cervical back: Normal range of motion and neck supple.  Skin:    General: Skin is warm.  Neurological:     Mental Status: He is alert and oriented to person, place, and time.  Psychiatric:        Mood and Affect: Affect normal.      LABORATORY DATA:  I have reviewed the data as listed Lab Results  Component Value Date   WBC 9.1 10/26/2022   HGB 13.9 10/26/2022   HCT 41.2 10/26/2022   MCV 92.6 10/26/2022   PLT 190 10/26/2022   Recent Labs    04/15/22 1015 07/16/22 0850 07/20/22 1039 10/21/22 0949 10/26/22 1421  NA 141  --  140  --  144  K 3.8  --  3.0*  --  3.0*  CL 107  --  104  --  106  CO2 29  --  28  --  29  GLUCOSE 106*  --  105*  --  111*  BUN 16  --  10  --  12  CREATININE 1.06   < > 0.74 0.90 0.87  CALCIUM 8.4*  --  8.8*  --  8.4*  GFRNONAA >60  --  >60  --  >60  PROT 6.8  --  6.9  --  6.7  ALBUMIN 3.5  --  3.7  --  3.6  AST 22  --  22  --  20  ALT  16  --  19  --  15  ALKPHOS 48  --  50  --  50  BILITOT 1.5*  --  1.9*  --  1.8*   < > = values in this interval not displayed.    RADIOGRAPHIC STUDIES: I have personally reviewed the radiological images as listed and agreed with the findings in the report. CT CHEST ABDOMEN PELVIS W CONTRAST  Result Date: 10/22/2022 CLINICAL DATA:  76 year old male with history of colon cancer. Follow-up study. * Tracking Code: BO * EXAM: CT CHEST, ABDOMEN, AND PELVIS WITH CONTRAST TECHNIQUE: Multidetector CT imaging of the chest, abdomen and pelvis was performed following the standard protocol during bolus administration of intravenous contrast. RADIATION DOSE REDUCTION: This exam was performed according to the departmental dose-optimization program which includes automated exposure control, adjustment of the mA and/or kV according to patient size and/or use of iterative reconstruction technique. CONTRAST:  134m OMNIPAQUE IOHEXOL 300 MG/ML  SOLN COMPARISON:  CT of the chest, abdomen and pelvis 07/16/2022. FINDINGS: CT CHEST FINDINGS Cardiovascular: Heart size is normal. There is no significant pericardial fluid, thickening or pericardial calcification. There is aortic atherosclerosis, as well as atherosclerosis of the great vessels of the mediastinum and the coronary arteries, including calcified atherosclerotic plaque in the left anterior descending coronary artery. Dilatation of the pulmonic trunk (3.8 cm in diameter). Left subclavian single-lumen Port-A-Cath with tip terminating in the distal superior vena cava. Mediastinum/Nodes: No pathologically enlarged mediastinal or hilar lymph nodes. Esophagus is unremarkable in appearance. No axillary lymphadenopathy. Lungs/Pleura: No suspicious appearing pulmonary nodules or masses are noted. No acute consolidative airspace disease. No pleural effusions. Scattered areas of mild scarring are noted throughout the mid to lower lungs bilaterally. Musculoskeletal: There are no  aggressive appearing lytic or blastic lesions noted in the visualized portions of the skeleton. CT ABDOMEN PELVIS FINDINGS Hepatobiliary: No suspicious cystic or solid hepatic lesions. No intra or extrahepatic biliary ductal dilatation. Status post cholecystectomy. Pancreas: No pancreatic mass. No pancreatic ductal dilatation. No pancreatic or peripancreatic fluid collections or inflammatory changes. Spleen: Unremarkable. Adrenals/Urinary Tract: Exophytic 2.2 cm intermediate attenuation (34 Hounsfield unit) lesion extending off the upper pole of the left kidney, similar to prior examinations dating back to 05/06/2020, presumably a mildly proteinaceous/hemorrhagic cyst. 2.1 cm exophytic low-attenuation lesion in the medial aspect of the lower pole of the right kidney, compatible with a simple (Bosniak class 1) cysts. No imaging follow-up for either of these lesions is recommended. No suspicious renal lesions. No hydroureteronephrosis. Urinary bladder is normal in appearance. Bilateral adrenal glands are normal in appearance. Stomach/Bowel: The appearance of the stomach is normal. There is no pathologic dilatation of small bowel or colon. Status post right hemicolectomy. Vascular/Lymphatic: Atherosclerosis throughout the abdominal aorta and pelvic vasculature, without evidence of aneurysm or dissection. No lymphadenopathy noted in the abdomen or pelvis. Reproductive: Prostate gland and seminal vesicles are unremarkable in appearance. Other: No significant volume of ascites.  No pneumoperitoneum. Musculoskeletal: There are no aggressive appearing lytic or blastic lesions noted in the visualized portions of the skeleton. IMPRESSION: 1. Status post right hemicolectomy with no findings to suggest recurrent or metastatic disease in the chest, abdomen or pelvis. 2. Aortic atherosclerosis, in addition to left anterior descending coronary artery disease. Assessment for potential risk factor modification, dietary therapy or  pharmacologic therapy may be warranted, if clinically indicated. 3. Dilatation of the pulmonic trunk (3.8 cm in diameter), concerning for pulmonary arterial hypertension. 4. Additional incidental findings, as above. Electronically Signed   By: DVinnie LangtonM.D.   On: 10/22/2022  06:41    ASSESSMENT & PLAN:   Cancer of right colon ([place]) #Colon cancer stage III [July 2021]-PT4BN2B- high risk. S/p  adjuvant CIV 5FU x12 cycles [finished Jan 18th, 2022]. JAN, 24th- 2023-CT CAP- NED- see below; s/p  Dr. Bary Castilla [OCT 2022] surveillaince colonocsopy. OCT 2023-  Status post right hemicolectomy with no findings to suggest recurrent or metastatic disease in the chest, abdomen or pelvis.  #Elevated tumor marker- JAN 20023- 6 [waxing & waning]- Awaiting labs from today. However, OCT 2023- CT negative; see above;  Monitor closely.   # COPD-STABLE-   continue inhalers.    # Port flush- q 2-98M. Will keep for now.    #Incidental findings on Imaging  CT scan CAP 2023: emphysema; pulmonary hypertension; athrrosclerosis; cystsI reviewed/discussed/counseled the patient.   # DISPOSITION: # refer to Dr.Byrnett re: port explantation # follow up in 4 months-  MD; labs- cbc/cmp;CEA; Dr.B      All questions were answered. The patient knows to call the clinic with any problems, questions or concerns.    Cammie Sickle, MD 10/26/2022 3:48 PM

## 2022-10-26 NOTE — Assessment & Plan Note (Addendum)
#  Colon cancer stage III [July 2021]-PT4BN2B- high risk. S/p  adjuvant CIV 5FU x12 cycles [finished Jan 18th, 2022]. JAN, 24th- 2023-CT CAP- NED- see below; s/p  Dr. Bary Castilla [OCT 2022] surveillaince colonocsopy. OCT 2023-  Status post right hemicolectomy with no findings to suggest recurrent or metastatic disease in the chest, abdomen or pelvis.  #Elevated tumor marker- JAN 20023- 6 [waxing & waning]- Awaiting labs from today. However, OCT 2023- CT negative; see above;  Monitor closely.   # COPD-STABLE-   continue inhalers.    # Port flush- q 2-60M. Refer to Dr.Byrnett re: removal.   #Incidental findings on Imaging  CT scan CAP 2023: emphysema; pulmonary hypertension; athrrosclerosis; cystsI reviewed/discussed/counseled the patient.   # DISPOSITION: # refer to Dr.Byrnett re: port explantation # follow up in 4 months-  MD; labs- cbc/cmp;CEA; Dr.B

## 2022-10-26 NOTE — Progress Notes (Signed)
Patient denies new problems/concerns today.   °

## 2022-10-26 NOTE — Patient Instructions (Signed)
Port removal orders faxed to Dr. Bary Castilla.

## 2022-10-27 LAB — CEA: CEA: 9.9 ng/mL — ABNORMAL HIGH (ref 0.0–4.7)

## 2023-02-26 ENCOUNTER — Inpatient Hospital Stay: Payer: Medicare HMO | Attending: Internal Medicine | Admitting: Internal Medicine

## 2023-02-26 ENCOUNTER — Inpatient Hospital Stay: Payer: Medicare HMO

## 2023-05-21 ENCOUNTER — Inpatient Hospital Stay: Payer: Medicare HMO

## 2023-05-21 ENCOUNTER — Inpatient Hospital Stay: Payer: Medicare HMO | Attending: Internal Medicine | Admitting: Internal Medicine

## 2023-05-21 ENCOUNTER — Encounter: Payer: Self-pay | Admitting: Internal Medicine

## 2023-05-21 VITALS — BP 125/79 | HR 52 | Temp 96.2°F | Ht 73.0 in | Wt 225.2 lb

## 2023-05-21 DIAGNOSIS — J439 Emphysema, unspecified: Secondary | ICD-10-CM | POA: Insufficient documentation

## 2023-05-21 DIAGNOSIS — I272 Pulmonary hypertension, unspecified: Secondary | ICD-10-CM | POA: Diagnosis not present

## 2023-05-21 DIAGNOSIS — C182 Malignant neoplasm of ascending colon: Secondary | ICD-10-CM | POA: Diagnosis present

## 2023-05-21 LAB — CBC
HCT: 39.6 % (ref 39.0–52.0)
Hemoglobin: 13.4 g/dL (ref 13.0–17.0)
MCH: 31.2 pg (ref 26.0–34.0)
MCHC: 33.8 g/dL (ref 30.0–36.0)
MCV: 92.3 fL (ref 80.0–100.0)
Platelets: 188 10*3/uL (ref 150–400)
RBC: 4.29 MIL/uL (ref 4.22–5.81)
RDW: 12.8 % (ref 11.5–15.5)
WBC: 7.8 10*3/uL (ref 4.0–10.5)
nRBC: 0 % (ref 0.0–0.2)

## 2023-05-21 LAB — COMPREHENSIVE METABOLIC PANEL
ALT: 13 U/L (ref 0–44)
AST: 17 U/L (ref 15–41)
Albumin: 3.6 g/dL (ref 3.5–5.0)
Alkaline Phosphatase: 44 U/L (ref 38–126)
Anion gap: 7 (ref 5–15)
BUN: 12 mg/dL (ref 8–23)
CO2: 28 mmol/L (ref 22–32)
Calcium: 8.6 mg/dL — ABNORMAL LOW (ref 8.9–10.3)
Chloride: 105 mmol/L (ref 98–111)
Creatinine, Ser: 0.85 mg/dL (ref 0.61–1.24)
GFR, Estimated: >60 mL/min (ref >60)
Glucose, Bld: 101 mg/dL — ABNORMAL HIGH (ref 70–99)
Potassium: 3.4 mmol/L — ABNORMAL LOW (ref 3.5–5.1)
Sodium: 140 mmol/L (ref 135–145)
Total Bilirubin: 2.1 mg/dL — ABNORMAL HIGH (ref 0.3–1.2)
Total Protein: 6.5 g/dL (ref 6.5–8.1)

## 2023-05-21 NOTE — Assessment & Plan Note (Signed)
#  Colon cancer stage III [July 2021]-PT4BN2B- high risk. S/p  adjuvant CIV 5FU x12 cycles [finished Jan 18th, 2022]. JAN, 24th- 2023-CT CAP- NED- see below; s/p Dr. Lemar Livings [OCT 2022] surveillaince colonocsopy.   # NOV 2023-  Status post right hemicolectomy with no findings to suggest recurrent or metastatic disease in the chest, abdomen or pelvis. stable.    Will repeat CT scan CAP now.   #Elevated tumor marker- JAN 20023- 6 [waxing & waning]- Awaiting labs from today. However, OCT 2023- CT negative; see above;  Monitor closely.   # COPD- continue inhalers- stable.      # Left cheek non healing ulcer- ? SCC; recommend evaluation with derma. Pt will speak to PCP re: referral.   # PIV- s/p port explanation.   #Incidental findings on Imaging  CT scan CAP 2023: emphysema; pulmonary hypertension; athrrosclerosis; cystsI reviewed/discussed/counseled the patient.   Will call results # DISPOSITION: # CT CAP in 1-2 weeks-  # follow up in last week of JAN 2025-  MD; labs- cbc/cmp;CEA; Dr.B

## 2023-05-21 NOTE — Progress Notes (Signed)
[facility] CONSULT NOTE  Patient Care Team: Jaclyn Shaggy, MD as PCP - General (Internal Medicine) Benita Gutter, RN as Oncology Nurse Navigator Earna Coder, MD as Consulting Physician (Hematology and Oncology) Lemar Livings Merrily Pew, MD as Consulting Physician (General Surgery)  CHIEF COMPLAINTS/PURPOSE OF CONSULTATION: Colon cancer  #  Oncology History Overview Note  # July 2021- RIGHT COLON ADENO CARCINOMA- Dr.Byrnett s/p right hemicolectomy- pT4b N2b- STAGE III [high risk]; MSI-STABLE.   # AUG 10th, 2021- FOLFOX [adjuvant];  OCT 2022- surveillance colonocsopy. s/p  Dr. Lemar Livings; repeat in 3 years  -------------------------------------------------------------------------------------------   - INVASIVE MODERATELY DIFFERENTIATED ADENOCARCINOMA.  - TEN OF FIFTY-TWO LYMPH NODES POSITIVE FOR METASTATIC ADENOCARCINOMA  (10/52).  - SINGLE INCIDENTAL TUBULOVILLOUS ADENOMA.  - THREE INCIDENTAL TUBULAR ADENOMAS.  - SEE CANCER SUMMARY.   CANCER CASE SUMMARY: COLON AND RECTUM  Procedure: Right hemicolectomy  Tumor Site: Cecum  Tumor Size: Greatest dimension: 12.2 cm  Macroscopic Tumor Perforation: Present  Histologic Type: Adenocarcinoma  Histologic Grade: G2: Moderately differentiated  Tumor Extension: Tumor directly invades adjacent structures (terminal  ileum)  Margins: All margins are uninvolved by invasive carcinoma, high-grade  dysplasia/intramucosal adenocarcinoma, and low-grade dysplasia  Treatment Effect: No known presurgical therapy  Lymphovascular Invasion: Present  Perineural Invasion: Present  Tumor Deposits: Present: Multiple  Regional Lymph Nodes:       Number of Lymph Nodes Involved: 10       Number of Lymph Nodes Examined: 52  -  # COPD- [Dr.Fleming];   # SURVIVORSHIP:   # GENETICS:   DIAGNOSIS: colon cancer  STAGE:   III      ;  GOALS:cure  CURRENT/MOST RECENT THERAPY : FOLFOX     Cancer of right colon (HCC)  05/20/2020  Initial Diagnosis   Cancer of right colon (HCC)   07/02/2020 - 12/12/2020 Chemotherapy   Patient is on Treatment Plan : COLORECTAL 5FU q14d x 6 months     12/08/2020 Cancer Staging   Staging form: Colon and Rectum, AJCC 8th Edition - Clinical: Stage IIIC (cT4b, cN2b, cM0) - Signed by Earna Coder, MD on 12/08/2020     HISTORY OF PRESENTING ILLNESS: Alone.  Walks independently.  James Hodge 77 y.o.  male high risk stage III colon cancer currently s/p adjuvant 5-FU continuous infusion-on surveillance is here for follow-up.  Patient noted to have Left cheek non healing ulcer. Never seen dermatologist.   Patient has not had any hospitalizations.  Denies any weight loss.  Denies any nausea vomiting abdominal pain.  No blood in stools or black-colored stools.    Review of Systems  Constitutional:  Negative for chills, diaphoresis, fever and malaise/fatigue.  HENT:  Negative for nosebleeds and sore throat.   Eyes:  Negative for double vision.  Respiratory:  Negative for cough, hemoptysis, sputum production and wheezing.   Cardiovascular:  Negative for chest pain, palpitations and orthopnea.  Gastrointestinal:  Negative for abdominal pain, blood in stool, constipation, diarrhea, heartburn, melena, nausea and vomiting.  Genitourinary:  Negative for dysuria, frequency and urgency.  Musculoskeletal:  Negative for back pain and joint pain.  Skin: Negative.  Negative for itching and rash.  Neurological:  Negative for dizziness, tingling, focal weakness, weakness and headaches.  Endo/Heme/Allergies:  Does not bruise/bleed easily.  Psychiatric/Behavioral:  Negative for depression. The patient is not nervous/anxious and does not have insomnia.      MEDICAL HISTORY:  Past Medical History:  Diagnosis Date   Cancer [facility])    COPD (  chronic obstructive pulmonary disease) (HCC)    Hypertension     SURGICAL HISTORY: Past Surgical History:  Procedure Laterality Date   CHOLECYSTECTOMY      Around 2001   COLONOSCOPY WITH PROPOFOL N/A 04/12/2020   Procedure: COLONOSCOPY WITH PROPOFOL;  Surgeon: Earline Mayotte, MD;  Location: [facility] ENDOSCOPY;  Service: Endoscopy;  Laterality: N/A;   COLONOSCOPY WITH PROPOFOL N/A 04/24/2020   Procedure: COLONOSCOPY WITH PROPOFOL;  Surgeon: Earline Mayotte, MD;  Location: [facility] ENDOSCOPY;  Service: Endoscopy;  Laterality: N/A;   COLONOSCOPY WITH PROPOFOL N/A 09/10/2021   Procedure: COLONOSCOPY WITH PROPOFOL;  Surgeon: Earline Mayotte, MD;  Location: [facility] ENDOSCOPY;  Service: Endoscopy;  Laterality: N/A;   ESOPHAGOGASTRODUODENOSCOPY (EGD) WITH PROPOFOL N/A 04/12/2020   Procedure: ESOPHAGOGASTRODUODENOSCOPY (EGD) WITH PROPOFOL;  Surgeon: Earline Mayotte, MD;  Location: [facility] ENDOSCOPY;  Service: Endoscopy;  Laterality: N/A;   EYE SURGERY Bilateral    cataract   LAPAROSCOPIC PARTIAL COLECTOMY Right 05/20/2020   Procedure: LAPAROSCOPIC PARTIAL COLECTOMY;  Surgeon: Earline Mayotte, MD;  Location: [facility] ORS;  Service: General;  Laterality: Right;  Sonda Rumble, RNFA to assist   PORTACATH PLACEMENT Left 06/19/2020   Procedure: INSERTION PORT-A-CATH;  Surgeon: Earline Mayotte, MD;  Location: [facility] ORS;  Service: General;  Laterality: Left;    SOCIAL HISTORY: Social History   Socioeconomic History   Marital status: Married    Spouse name: Not on file   Number of children: Not on file   Years of education: Not on file   Highest education level: Not on file  Occupational History   Not on file  Tobacco Use   Smoking status: Former    Years: 3    Types: Cigarettes    Quit date: 11/23/1968    Years since quitting: 54.5   Smokeless tobacco: Never  Vaping Use   Vaping Use: Never used  Substance and Sexual Activity   Alcohol use: Not Currently   Drug use: Never   Sexual activity: Not on file  Other Topics Concern   Not on file  Social History Narrative   Lives in [place]; smoked- quit in 70s; beer social; 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  MEDICATIONS:  Current Outpatient Medications  Medication Sig Dispense Refill   BREO ELLIPTA 100-25 MCG/INH AEPB Inhale 1 puff into the lungs at bedtime.     Butalbital-APAP-Caffeine 50-300-40 MG CAPS Take 1-2 tablets by mouth every 8 (eight) hours as needed.     fexofenadine (ALLEGRA) 180 MG tablet Take 180 mg by mouth daily.     lidocaine-prilocaine (EMLA) cream Apply 1 application. topically as needed. 30 g 3   lisinopril (ZESTRIL) 10 MG tablet Take 10 mg by mouth daily.     potassium chloride SA (KLOR-CON M20) 20 MEQ tablet TAKE 1 TABLET BY MOUTH TWICE A DAY 60 tablet 3   SYMBICORT 160-4.5 MCG/ACT inhaler Inhale into the lungs.     tiotropium (SPIRIVA) 18 MCG inhalation capsule Place 18 mcg into inhaler and inhale at bedtime.  fexofenadine (ALLEGRA) 180 MG tablet Take 1 tablet by mouth daily. (Patient not taking: Reported on 10/26/2022)     GI Cocktail (alum & mag hydroxide-simethicone/lidocaine)oral mixture Each dose to contain 15mL Maalox and 15mL viscous lidocaine (Patient not taking: Reported on 10/26/2022) 300 mL 0   ondansetron (ZOFRAN) 8 MG tablet One pill every 8 hours as needed for nausea/vomitting. (Patient not taking: Reported on 10/26/2022) 40 tablet 1   prochlorperazine (COMPAZINE) 10 MG tablet Take 1 tablet (10 mg total) by mouth every 6 (six) hours as needed for nausea or vomiting. (Patient not taking: Reported on 10/26/2022) 40 tablet 1   No current facility-administered medications for this visit.   Facility-Administered Medications Ordered in Other  Visits  Medication Dose Route Frequency Provider Last Rate Last Admin   heparin lock flush 100 unit/mL  500 Units Intravenous Once Earna Coder, MD          .  PHYSICAL EXAMINATION: ECOG PERFORMANCE STATUS: 0 - Asymptomatic  Vitals:   05/21/23 1010  BP: 125/79  Pulse: (!) 52  Temp: (!) 96.2 F (35.7 C)  SpO2: 93%   Filed Weights   05/21/23 1010  Weight: 225 lb 3.2 oz (102.2 kg)     Physical Exam HENT:     Head: Normocephalic and atraumatic.     Mouth/Throat:     Pharynx: No oropharyngeal exudate.  Eyes:     Pupils: Pupils are equal, round, and reactive to light.  Cardiovascular:     Rate and Rhythm: Normal rate and regular rhythm.  Pulmonary:     Effort: No respiratory distress.     Breath sounds: No wheezing.     Comments: Decreased breath sounds Abdominal:     General: Bowel sounds are normal. There is no distension.     Palpations: Abdomen is soft. There is no mass.     Tenderness: There is no abdominal tenderness. There is no guarding or rebound.  Musculoskeletal:        General: No tenderness. Normal range of motion.     Cervical back: Normal range of motion and neck supple.  Skin:    General: Skin is warm.  Neurological:     Mental Status: He is alert and oriented to person, place, and time.  Psychiatric:        Mood and Affect: Affect normal.      LABORATORY DATA:  I have reviewed the data as listed Lab Results  Component Value Date   WBC 7.8 05/21/2023   HGB 13.4 05/21/2023   HCT 39.6 05/21/2023   MCV 92.3 05/21/2023   PLT 188 05/21/2023   Recent Labs    07/20/22 1039 10/21/22 0949 10/26/22 1421 05/21/23 1004  NA 140  --  144 140  K 3.0*  --  3.0* 3.4*  CL 104  --  106 105  CO2 28  --  29 28  GLUCOSE 105*  --  111* 101*  BUN 10  --  12 12  CREATININE 0.74 0.90 0.87 0.85  CALCIUM 8.8*  --  8.4* 8.6*  GFRNONAA >60  --  >60 >60  PROT 6.9  --  6.7 6.5  ALBUMIN 3.7  --  3.6 3.6  AST 22  --  20 17  ALT 19  --  15 13   ALKPHOS 50  --  50 44  BILITOT 1.9*  --  1.8* 2.1*    RADIOGRAPHIC STUDIES: I have personally reviewed the radiological images as listed and agreed with  the findings in the report. No results found.  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 S/p  adjuvant CIV 5FU x12 cycles [finished Jan 18th, 2022]. JAN, 24th- 2023-CT CAP- NED- see below; s/p Dr. Lemar Livings [OCT 2022] surveillaince colonocsopy.   # NOV 2023-  Status post right hemicolectomy with no findings to suggest recurrent or metastatic disease in the chest, abdomen or pelvis. stable.    Will repeat CT scan CAP now.   #Elevated tumor marker- JAN 20023- 6 [waxing & waning]- Awaiting labs from today. However, OCT 2023- CT negative; see above;  Monitor closely.   # COPD- continue inhalers- stable.      # Left cheek non healing ulcer- ? SCC; recommend evaluation with derma. Pt will speak to PCP re: referral.   # PIV- s/p port explanation.   #Incidental findings on Imaging  CT scan CAP 2023: emphysema; pulmonary hypertension; athrrosclerosis; cystsI reviewed/discussed/counseled the patient.   Will call results # DISPOSITION: # CT CAP in 1-2 weeks-  # follow up in last week of JAN 2025-  MD; labs- cbc/cmp;CEA; Dr.B   All questions were answered. The patient knows to call the clinic with any problems, questions or concerns.    Earna Coder, MD 05/21/2023 11:38 AM

## 2023-05-21 NOTE — Progress Notes (Signed)
No concerns today 

## 2023-05-22 LAB — CEA: CEA: 8.8 ng/mL — ABNORMAL HIGH (ref 0.0–4.7)

## 2023-05-28 ENCOUNTER — Ambulatory Visit
Admission: RE | Admit: 2023-05-28 | Discharge: 2023-05-28 | Disposition: A | Discharge status: Home / Self Care | Payer: Medicare HMO | Source: Ambulatory Visit | Attending: Internal Medicine | Admitting: Internal Medicine

## 2023-05-28 DIAGNOSIS — C182 Malignant neoplasm of ascending colon: Secondary | ICD-10-CM | POA: Diagnosis present

## 2023-05-28 MED ORDER — BARIUM SULFATE 2 % PO SUSP
450.0000 mL | Freq: Once | ORAL | Status: AC
  Administered 2023-05-28 (×2): 450 mL via ORAL

## 2023-05-28 MED ORDER — IOHEXOL 300 MG/ML  SOLN
100.0000 mL | Freq: Once | INTRAMUSCULAR | Status: AC | PRN
  Administered 2023-05-28: 100 mL via INTRAVENOUS

## 2023-12-20 ENCOUNTER — Encounter: Payer: Self-pay | Admitting: Internal Medicine

## 2023-12-20 ENCOUNTER — Inpatient Hospital Stay (HOSPITAL_BASED_OUTPATIENT_CLINIC_OR_DEPARTMENT_OTHER): Payer: Medicare HMO | Admitting: Internal Medicine

## 2023-12-20 ENCOUNTER — Inpatient Hospital Stay: Payer: Medicare HMO | Attending: Internal Medicine

## 2023-12-20 VITALS — BP 137/82 | HR 55 | Temp 97.6°F | Resp 20 | Wt 231.8 lb

## 2023-12-20 DIAGNOSIS — J439 Emphysema, unspecified: Secondary | ICD-10-CM | POA: Diagnosis not present

## 2023-12-20 DIAGNOSIS — C182 Malignant neoplasm of ascending colon: Secondary | ICD-10-CM | POA: Diagnosis not present

## 2023-12-20 DIAGNOSIS — Z85038 Personal history of other malignant neoplasm of large intestine: Secondary | ICD-10-CM | POA: Insufficient documentation

## 2023-12-20 DIAGNOSIS — I272 Pulmonary hypertension, unspecified: Secondary | ICD-10-CM | POA: Insufficient documentation

## 2023-12-20 DIAGNOSIS — Z08 Encounter for follow-up examination after completed treatment for malignant neoplasm: Secondary | ICD-10-CM | POA: Diagnosis present

## 2023-12-20 LAB — CBC WITH DIFFERENTIAL (CANCER CENTER ONLY)
Abs Immature Granulocytes: 0.03 10*3/uL (ref 0.00–0.07)
Basophils Absolute: 0.1 10*3/uL (ref 0.0–0.1)
Basophils Relative: 1 %
Eosinophils Absolute: 0.3 10*3/uL (ref 0.0–0.5)
Eosinophils Relative: 4 %
HCT: 43.1 % (ref 39.0–52.0)
Hemoglobin: 14.1 g/dL (ref 13.0–17.0)
Immature Granulocytes: 0 %
Lymphocytes Relative: 28 %
Lymphs Abs: 2.5 10*3/uL (ref 0.7–4.0)
MCH: 31.1 pg (ref 26.0–34.0)
MCHC: 32.7 g/dL (ref 30.0–36.0)
MCV: 94.9 fL (ref 80.0–100.0)
Monocytes Absolute: 0.8 10*3/uL (ref 0.1–1.0)
Monocytes Relative: 9 %
Neutro Abs: 5.2 10*3/uL (ref 1.7–7.7)
Neutrophils Relative %: 58 %
Platelet Count: 258 10*3/uL (ref 150–400)
RBC: 4.54 MIL/uL (ref 4.22–5.81)
RDW: 12.7 % (ref 11.5–15.5)
WBC Count: 8.9 10*3/uL (ref 4.0–10.5)
nRBC: 0 % (ref 0.0–0.2)

## 2023-12-20 LAB — CMP (CANCER CENTER ONLY)
ALT: 15 U/L (ref 0–44)
AST: 18 U/L (ref 15–41)
Albumin: 4 g/dL (ref 3.5–5.0)
Alkaline Phosphatase: 48 U/L (ref 38–126)
Anion gap: 8 (ref 5–15)
BUN: 26 mg/dL — ABNORMAL HIGH (ref 8–23)
CO2: 27 mmol/L (ref 22–32)
Calcium: 9.2 mg/dL (ref 8.9–10.3)
Chloride: 103 mmol/L (ref 98–111)
Creatinine: 1.06 mg/dL (ref 0.61–1.24)
GFR, Estimated: >60 mL/min (ref >60)
Glucose, Bld: 96 mg/dL (ref 70–99)
Potassium: 4.7 mmol/L (ref 3.5–5.1)
Sodium: 138 mmol/L (ref 135–145)
Total Bilirubin: 1.9 mg/dL — ABNORMAL HIGH (ref 0.0–1.2)
Total Protein: 7 g/dL (ref 6.5–8.1)

## 2023-12-20 NOTE — Assessment & Plan Note (Addendum)
#  Colon cancer stage III [July 2021]-PT4BN2B- high risk. S/p  adjuvant CIV 5FU x12 cycles [finished Jan 18th, 2022]. JAN, 24th- 2023-CT CAP- NED- see below; s/p Dr. Lemar Livings [OCT 2022]- wnl. ? surveillance colonoscopy in 3-5 years. Will need GI referral.   # JULY 2024- -  Status post right hemicolectomy with no findings to suggest recurrent or metastatic disease in the chest, abdomen or pelvis. stable.   #Elevated tumor marker- JAN 20023- 6 [waxing & waning]- Awaiting labs from today. However, JULY 2024- CT negative; see above;  Monitor closely.   # COPD- continue inhalers- stable.      # PIV- s/p port explanation.   #Incidental findings on Imaging  CT scan CAP 2023: emphysema; pulmonary hypertension; athrrosclerosis; cystsI reviewed/discussed/counseled the patient.   # DISPOSITION: # follow up in 6 months-  MD; labs- cbc/cmp;CEA;CT CAP Dr.B

## 2023-12-20 NOTE — Progress Notes (Signed)
[facility] CONSULT NOTE  Patient Care Team: Jaclyn Shaggy, MD as PCP - General (Internal Medicine) Earna Coder, MD as Consulting Physician (Hematology and Oncology) Lemar Livings Merrily Pew, MD as Consulting Physician (General Surgery)  CHIEF COMPLAINTS/PURPOSE OF CONSULTATION: Colon cancer  #  Oncology History Overview Note  # July 2021- RIGHT COLON ADENO CARCINOMA- Dr.Byrnett s/p right hemicolectomy- pT4b N2b- STAGE III [high risk]; MSI-STABLE.   # AUG 10th, 2021- FOLFOX [adjuvant];  OCT 2022- surveillance colonocsopy. s/p  Dr. Lemar Livings; repeat in 3 years  -------------------------------------------------------------------------------------------   - INVASIVE MODERATELY DIFFERENTIATED ADENOCARCINOMA.  - TEN OF FIFTY-TWO LYMPH NODES POSITIVE FOR METASTATIC ADENOCARCINOMA  (10/52).  - SINGLE INCIDENTAL TUBULOVILLOUS ADENOMA.  - THREE INCIDENTAL TUBULAR ADENOMAS.  - SEE CANCER SUMMARY.   CANCER CASE SUMMARY: COLON AND RECTUM  Procedure: Right hemicolectomy  Tumor Site: Cecum  Tumor Size: Greatest dimension: 12.2 cm  Macroscopic Tumor Perforation: Present  Histologic Type: Adenocarcinoma  Histologic Grade: G2: Moderately differentiated  Tumor Extension: Tumor directly invades adjacent structures (terminal  ileum)  Margins: All margins are uninvolved by invasive carcinoma, high-grade  dysplasia/intramucosal adenocarcinoma, and low-grade dysplasia  Treatment Effect: No known presurgical therapy  Lymphovascular Invasion: Present  Perineural Invasion: Present  Tumor Deposits: Present: Multiple  Regional Lymph Nodes:       Number of Lymph Nodes Involved: 10       Number of Lymph Nodes Examined: 52  -  # COPD- [Dr.Fleming];   # SURVIVORSHIP:   # GENETICS:   DIAGNOSIS: colon cancer  STAGE:   III      ;  GOALS:cure  CURRENT/MOST RECENT THERAPY : FOLFOX     Cancer of right colon (HCC)  05/20/2020 Initial Diagnosis   Cancer of right colon (HCC)    07/02/2020 - 12/12/2020 Chemotherapy   Patient is on Treatment Plan : COLORECTAL 5FU q14d x 6 months     12/08/2020 Cancer Staging   Staging form: Colon and Rectum, AJCC 8th Edition - Clinical: Stage IIIC (cT4b, cN2b, cM0) - Signed by Earna Coder, MD on 12/08/2020     HISTORY OF PRESENTING ILLNESS: Alone.  Walks independently.  James Hodge 78 y.o.  male high risk stage III colon cancer currently s/p adjuvant 5-FU continuous infusion-on surveillance is here for follow-up.  Patient has not had any hospitalizations.  Denies any weight loss.  Denies any nausea vomiting abdominal pain.  No blood in stools or black-colored stools.    Review of Systems  Constitutional:  Negative for chills, diaphoresis, fever and malaise/fatigue.  HENT:  Negative for nosebleeds and sore throat.   Eyes:  Negative for double vision.  Respiratory:  Negative for cough, hemoptysis, sputum production and wheezing.   Cardiovascular:  Negative for chest pain, palpitations and orthopnea.  Gastrointestinal:  Negative for abdominal pain, blood in stool, constipation, diarrhea, heartburn, melena, nausea and vomiting.  Genitourinary:  Negative for dysuria, frequency and urgency.  Musculoskeletal:  Negative for back pain and joint pain.  Skin: Negative.  Negative for itching and rash.  Neurological:  Negative for dizziness, tingling, focal weakness, weakness and headaches.  Endo/Heme/Allergies:  Does not bruise/bleed easily.  Psychiatric/Behavioral:  Negative for depression. The patient is not nervous/anxious and does not have insomnia.      MEDICAL HISTORY:  Past Medical History:  Diagnosis Date   Cancer (HCC)    COPD (chronic obstructive pulmonary disease) (HCC)    Hypertension     SURGICAL HISTORY: Past Surgical History:  Procedure Laterality Date  CHOLECYSTECTOMY     Around 2001   COLONOSCOPY WITH PROPOFOL N/A 04/12/2020   Procedure: COLONOSCOPY WITH PROPOFOL;  Surgeon: Earline Mayotte, MD;   Location: [facility] ENDOSCOPY;  Service: Endoscopy;  Laterality: N/A;   COLONOSCOPY WITH PROPOFOL N/A 04/24/2020   Procedure: COLONOSCOPY WITH PROPOFOL;  Surgeon: Earline Mayotte, MD;  Location: [facility] ENDOSCOPY;  Service: Endoscopy;  Laterality: N/A;   COLONOSCOPY WITH PROPOFOL N/A 09/10/2021   Procedure: COLONOSCOPY WITH PROPOFOL;  Surgeon: Earline Mayotte, MD;  Location: [facility] ENDOSCOPY;  Service: Endoscopy;  Laterality: N/A;   ESOPHAGOGASTRODUODENOSCOPY (EGD) WITH PROPOFOL N/A 04/12/2020   Procedure: ESOPHAGOGASTRODUODENOSCOPY (EGD) WITH PROPOFOL;  Surgeon: Earline Mayotte, MD;  Location: [facility] ENDOSCOPY;  Service: Endoscopy;  Laterality: N/A;   EYE SURGERY Bilateral    cataract   LAPAROSCOPIC PARTIAL COLECTOMY Right 05/20/2020   Procedure: LAPAROSCOPIC PARTIAL COLECTOMY;  Surgeon: Earline Mayotte, MD;  Location: [facility] ORS;  Service: General;  Laterality: Right;  Sonda Rumble, RNFA to assist   PORTACATH PLACEMENT Left 06/19/2020   Procedure: INSERTION PORT-A-CATH;  Surgeon: Earline Mayotte, MD;  Location: [facility] ORS;  Service: General;  Laterality: Left;    SOCIAL HISTORY: Social History   Socioeconomic History   Marital status: Married    Spouse name: Not on file   Number of children: Not on file   Years of education: Not on file   Highest education level: Not on file  Occupational History   Not on file  Tobacco Use   Smoking status: Former    Current packs/day: 0.00    Types: Cigarettes    Start date: 11/23/1965    Quit date: 11/23/1968    Years since quitting: 55.1   Smokeless tobacco: Never  Vaping Use   Vaping status: Never Used  Substance and Sexual Activity   Alcohol use: Not Currently   Drug use: Never   Sexual activity: Not on file  Other Topics Concern   Not on file  Social History Narrative   Lives in [place]; smoked- quit in 70s; beer social; 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  MEDICATIONS:  Current Outpatient Medications  Medication Sig Dispense Refill   BREO ELLIPTA 100-25 MCG/INH AEPB Inhale 1 puff into the lungs at bedtime.     Butalbital-APAP-Caffeine 50-300-40 MG CAPS Take 1-2 tablets by mouth every 8 (eight) hours as needed.     fexofenadine (ALLEGRA) 180 MG tablet Take 180 mg by mouth daily.     lidocaine-prilocaine (EMLA) cream Apply 1 application. topically as needed. 30 g 3   lisinopril (ZESTRIL) 10 MG tablet Take 10 mg by mouth daily.     potassium chloride SA (KLOR-CON M20) 20 MEQ tablet TAKE 1 TABLET BY MOUTH TWICE A DAY 60 tablet 3   SYMBICORT 160-4.5 MCG/ACT inhaler Inhale into the lungs.     tiotropium (SPIRIVA) 18 MCG inhalation capsule Place 18 mcg into inhaler and inhale at bedtime.      fexofenadine (ALLEGRA) 180 MG tablet Take 1 tablet by mouth daily. (Patient not taking: Reported on  12/20/2023)     GI Cocktail (alum & mag hydroxide-simethicone/lidocaine)oral mixture Each dose to contain 15mL Maalox and 15mL viscous lidocaine (Patient not taking: Reported on 12/20/2023) 300 mL 0   ondansetron (ZOFRAN) 8 MG tablet One pill every 8 hours as needed for nausea/vomitting. (Patient not taking: Reported on 12/20/2023) 40 tablet 1   prochlorperazine (COMPAZINE) 10 MG tablet Take 1 tablet (10 mg total) by mouth every 6 (six) hours as needed for nausea or vomiting. (Patient not taking: Reported on 12/20/2023) 40 tablet 1   No current facility-administered medications for this visit.   Facility-Administered Medications Ordered in Other Visits  Medication Dose Route Frequency Provider Last Rate Last Admin   heparin lock flush 100  unit/mL  500 Units Intravenous Once Earna Coder, MD          .  PHYSICAL EXAMINATION: ECOG PERFORMANCE STATUS: 0 - Asymptomatic  Vitals:   12/20/23 0952  BP: 137/82  Pulse: (!) 55  Resp: 20  Temp: 97.6 F (36.4 C)  SpO2: 100%   Filed Weights   12/20/23 0952  Weight: 231 lb 12.8 oz (105.1 kg)     Physical Exam HENT:     Head: Normocephalic and atraumatic.     Mouth/Throat:     Pharynx: No oropharyngeal exudate.  Eyes:     Pupils: Pupils are equal, round, and reactive to light.  Cardiovascular:     Rate and Rhythm: Normal rate and regular rhythm.  Pulmonary:     Effort: No respiratory distress.     Breath sounds: No wheezing.     Comments: Decreased breath sounds Abdominal:     General: Bowel sounds are normal. There is no distension.     Palpations: Abdomen is soft. There is no mass.     Tenderness: There is no abdominal tenderness. There is no guarding or rebound.  Musculoskeletal:        General: No tenderness. Normal range of motion.     Cervical back: Normal range of motion and neck supple.  Skin:    General: Skin is warm.  Neurological:     Mental Status: He is alert and oriented to person, place, and time.  Psychiatric:        Mood and Affect: Affect normal.      LABORATORY DATA:  I have reviewed the data as listed Lab Results  Component Value Date   WBC 8.9 12/20/2023   HGB 14.1 12/20/2023   HCT 43.1 12/20/2023   MCV 94.9 12/20/2023   PLT 258 12/20/2023   Recent Labs    05/21/23 1004 12/20/23 0937  NA 140 138  K 3.4* 4.7  CL 105 103  CO2 28 27  GLUCOSE 101* 96  BUN 12 26*  CREATININE 0.85 1.06  CALCIUM 8.6* 9.2  GFRNONAA >60 >60  PROT 6.5 7.0  ALBUMIN 3.6 4.0  AST 17 18  ALT 13 15  ALKPHOS 44 48  BILITOT 2.1* 1.9*    RADIOGRAPHIC STUDIES: I have personally reviewed the radiological images as listed and agreed with the findings in the report. No results found.  ASSESSMENT & PLAN:   Cancer of right colon  (HCC) #Colon cancer stage III [July 2021]-PT4BN2B- high risk. S/p  adjuvant CIV 5FU x12 cycles [finished Jan 18th, 2022]. JAN, 24th- 2023-CT CAP- NED- see below; s/p Dr. Lemar Livings [OCT 2022]- wnl. ? surveillance colonoscopy in 3-5 years. Will need GI referral.   # JULY 2024- -  Status post right hemicolectomy with no findings to  suggest recurrent or metastatic disease in the chest, abdomen or pelvis. stable.   #Elevated tumor marker- JAN 20023- 6 [waxing & waning]- Awaiting labs from today. However, JULY 2024- CT negative; see above;  Monitor closely.   # COPD- continue inhalers- stable.      # PIV- s/p port explanation.   #Incidental findings on Imaging  CT scan CAP 2023: emphysema; pulmonary hypertension; athrrosclerosis; cystsI reviewed/discussed/counseled the patient.   # DISPOSITION: # follow up in 6 months-  MD; labs- cbc/cmp;CEA;CT CAP Dr.B    All questions were answered. The patient knows to call the clinic with any problems, questions or concerns.    Earna Coder, MD 12/20/2023 10:42 AM

## 2023-12-20 NOTE — Progress Notes (Signed)
Patient has no concerns

## 2023-12-21 LAB — CEA: CEA: 17.8 ng/mL — ABNORMAL HIGH (ref 0.0–4.7)

## 2023-12-27 ENCOUNTER — Other Ambulatory Visit: Payer: Self-pay | Admitting: Internal Medicine

## 2023-12-27 ENCOUNTER — Telehealth: Payer: Self-pay | Admitting: Internal Medicine

## 2023-12-27 NOTE — Telephone Encounter (Signed)
Cancer markers elevated baseline-recommend CT scan sooner than 6 months. Please inform pt-  Please reschedule the CT scan appointment next available-

## 2023-12-31 ENCOUNTER — Ambulatory Visit
Admission: RE | Admit: 2023-12-31 | Discharge: 2023-12-31 | Disposition: A | Discharge status: Home / Self Care | Payer: Medicare HMO | Source: Ambulatory Visit | Attending: Internal Medicine | Admitting: Internal Medicine

## 2023-12-31 DIAGNOSIS — C182 Malignant neoplasm of ascending colon: Secondary | ICD-10-CM | POA: Insufficient documentation

## 2023-12-31 MED ORDER — BARIUM SULFATE 2 % PO SUSP
450.0000 mL | Freq: Once | ORAL | Status: AC
  Administered 2023-12-31: 450 mL via ORAL

## 2023-12-31 MED ORDER — IOHEXOL 300 MG/ML  SOLN
100.0000 mL | Freq: Once | INTRAMUSCULAR | Status: AC | PRN
  Administered 2023-12-31: 100 mL via INTRAVENOUS

## 2024-01-03 ENCOUNTER — Ambulatory Visit: Payer: Medicare HMO

## 2024-01-09 NOTE — Progress Notes (Signed)
Called patient unable to see the patient.  Left a voicemail that the CT scan does not show any evidence of cancer.    Recommend follow-up in 6 months as planned/no CT scan needed in 6 months.  Please schedule- In 6 months MD 3 days prior- labs CBC CMP CEA- GB

## 2024-01-10 ENCOUNTER — Telehealth: Payer: Self-pay | Admitting: Internal Medicine

## 2024-01-10 NOTE — Telephone Encounter (Signed)
Pt has appt on 2/26 to review the results of the CT- GB

## 2024-01-19 ENCOUNTER — Other Ambulatory Visit: Payer: Self-pay

## 2024-01-19 ENCOUNTER — Inpatient Hospital Stay (HOSPITAL_BASED_OUTPATIENT_CLINIC_OR_DEPARTMENT_OTHER): Payer: Medicare HMO | Admitting: Internal Medicine

## 2024-01-19 ENCOUNTER — Encounter: Payer: Self-pay | Admitting: Internal Medicine

## 2024-01-19 ENCOUNTER — Inpatient Hospital Stay: Payer: Medicare HMO | Attending: Internal Medicine

## 2024-01-19 VITALS — BP 144/72 | HR 55 | Temp 96.5°F | Ht 73.0 in | Wt 236.6 lb

## 2024-01-19 DIAGNOSIS — C182 Malignant neoplasm of ascending colon: Secondary | ICD-10-CM | POA: Diagnosis not present

## 2024-01-19 DIAGNOSIS — Z85038 Personal history of other malignant neoplasm of large intestine: Secondary | ICD-10-CM | POA: Insufficient documentation

## 2024-01-19 DIAGNOSIS — Z08 Encounter for follow-up examination after completed treatment for malignant neoplasm: Secondary | ICD-10-CM | POA: Diagnosis present

## 2024-01-19 LAB — CMP (CANCER CENTER ONLY)
ALT: 13 U/L (ref 0–44)
AST: 17 U/L (ref 15–41)
Albumin: 3.7 g/dL (ref 3.5–5.0)
Alkaline Phosphatase: 41 U/L (ref 38–126)
Anion gap: 6 (ref 5–15)
BUN: 17 mg/dL (ref 8–23)
CO2: 29 mmol/L (ref 22–32)
Calcium: 9.1 mg/dL (ref 8.9–10.3)
Chloride: 105 mmol/L (ref 98–111)
Creatinine: 0.93 mg/dL (ref 0.61–1.24)
GFR, Estimated: >60 mL/min (ref >60)
Glucose, Bld: 101 mg/dL — ABNORMAL HIGH (ref 70–99)
Potassium: 4.5 mmol/L (ref 3.5–5.1)
Sodium: 140 mmol/L (ref 135–145)
Total Bilirubin: 1.9 mg/dL — ABNORMAL HIGH (ref 0.0–1.2)
Total Protein: 6.9 g/dL (ref 6.5–8.1)

## 2024-01-19 LAB — CBC WITH DIFFERENTIAL (CANCER CENTER ONLY)
Abs Immature Granulocytes: 0.03 10*3/uL (ref 0.00–0.07)
Basophils Absolute: 0 10*3/uL (ref 0.0–0.1)
Basophils Relative: 1 %
Eosinophils Absolute: 0.3 10*3/uL (ref 0.0–0.5)
Eosinophils Relative: 3 %
HCT: 40.3 % (ref 39.0–52.0)
Hemoglobin: 13.2 g/dL (ref 13.0–17.0)
Immature Granulocytes: 0 %
Lymphocytes Relative: 27 %
Lymphs Abs: 2.3 10*3/uL (ref 0.7–4.0)
MCH: 31.4 pg (ref 26.0–34.0)
MCHC: 32.8 g/dL (ref 30.0–36.0)
MCV: 96 fL (ref 80.0–100.0)
Monocytes Absolute: 0.8 10*3/uL (ref 0.1–1.0)
Monocytes Relative: 10 %
Neutro Abs: 4.9 10*3/uL (ref 1.7–7.7)
Neutrophils Relative %: 59 %
Platelet Count: 223 10*3/uL (ref 150–400)
RBC: 4.2 MIL/uL — ABNORMAL LOW (ref 4.22–5.81)
RDW: 13.2 % (ref 11.5–15.5)
WBC Count: 8.4 10*3/uL (ref 4.0–10.5)
nRBC: 0 % (ref 0.0–0.2)

## 2024-01-19 NOTE — Progress Notes (Signed)
 CT CAP 12/31/23.

## 2024-01-19 NOTE — Assessment & Plan Note (Signed)
#  Colon cancer stage III [July 2021]-PT4BN2B- high risk. S/p  adjuvant CIV 5FU x12 cycles [finished Jan 18th, 2022]. JAN, 24th- 2023-CT CAP- NED- see below; s/p Dr. Lemar Livings [OCT 2022]- wnl. ? surveillance colonoscopy in 3-5 years. [facility]- GI referral re: surveillance colonoscopy/ hx of colon cancer  # FEB 2025- CT CAP-  Status post right hemicolectomy with no findings to suggest recurrent or metastatic disease in the chest, abdomen or pelvis. Stable.   #Elevated tumor marker- FEB 2025- 17-waxing & waning]- Awaiting labs from today. However, FEb 2025-- CT negative; see above;  Monitor closely.   # COPD- continue inhalers- stable.      # PIV- s/p port explanation.   #Incidental findings on Imaging  CT scan CAP 2025 : emphysema; pulmonary hypertension; athrrosclerosis; cystsI reviewed/discussed/counseled the patient.   # DISPOSITION: #  [facility]- [facility] referral re: surveillance colonoscopy/ hx of colon cancer  # follow up in 6 months-  MD; labs- cbc/cmp;CEA;- Dr.B

## 2024-01-19 NOTE — Progress Notes (Signed)
 [facility] CONSULT NOTE  Patient Care Team: Jaclyn Shaggy, MD as PCP - General (Internal Medicine) Earna Coder, MD as Consulting Physician (Hematology and Oncology) Lemar Livings Merrily Pew, MD as Consulting Physician (General Surgery)  CHIEF COMPLAINTS/PURPOSE OF CONSULTATION: Colon cancer  #  Oncology History Overview Note  # July 2021- RIGHT COLON ADENO CARCINOMA- Dr.Byrnett s/p right hemicolectomy- pT4b N2b- STAGE III [high risk]; MSI-STABLE.   # AUG 10th, 2021- FOLFOX [adjuvant];  OCT 2022- surveillance colonocsopy. s/p  Dr. Lemar Livings; repeat in 3 years  -------------------------------------------------------------------------------------------   - INVASIVE MODERATELY DIFFERENTIATED ADENOCARCINOMA.  - TEN OF FIFTY-TWO LYMPH NODES POSITIVE FOR METASTATIC ADENOCARCINOMA  (10/52).  - SINGLE INCIDENTAL TUBULOVILLOUS ADENOMA.  - THREE INCIDENTAL TUBULAR ADENOMAS.  - SEE CANCER SUMMARY.   CANCER CASE SUMMARY: COLON AND RECTUM  Procedure: Right hemicolectomy  Tumor Site: Cecum  Tumor Size: Greatest dimension: 12.2 cm  Macroscopic Tumor Perforation: Present  Histologic Type: Adenocarcinoma  Histologic Grade: G2: Moderately differentiated  Tumor Extension: Tumor directly invades adjacent structures (terminal  ileum)  Margins: All margins are uninvolved by invasive carcinoma, high-grade  dysplasia/intramucosal adenocarcinoma, and low-grade dysplasia  Treatment Effect: No known presurgical therapy  Lymphovascular Invasion: Present  Perineural Invasion: Present  Tumor Deposits: Present: Multiple  Regional Lymph Nodes:       Number of Lymph Nodes Involved: 10       Number of Lymph Nodes Examined: 52  -  # COPD- [Dr.Fleming];   # SURVIVORSHIP:   # GENETICS:   DIAGNOSIS: colon cancer  STAGE:   III      ;  GOALS:cure  CURRENT/MOST RECENT THERAPY : FOLFOX     Cancer of right colon (HCC)  05/20/2020 Initial Diagnosis   Cancer of right colon (HCC)    07/02/2020 - 12/12/2020 Chemotherapy   Patient is on Treatment Plan : COLORECTAL 5FU q14d x 6 months     12/08/2020 Cancer Staging   Staging form: Colon and Rectum, AJCC 8th Edition - Clinical: Stage IIIC (cT4b, cN2b, cM0) - Signed by Earna Coder, MD on 12/08/2020     HISTORY OF PRESENTING ILLNESS: Alone.  Walks independently.  Lennette Bihari 78 y.o.  male high risk stage III colon cancer currently s/p adjuvant therapy on surveillance is here for follow-up/and review the results of the  CT scan.   Patient has not had any hospitalizations.  Denies any weight loss.  Denies any nausea vomiting abdominal pain.  No blood in stools or black-colored stools.   Review of Systems  Constitutional:  Negative for chills, diaphoresis, fever and malaise/fatigue.  HENT:  Negative for nosebleeds and sore throat.   Eyes:  Negative for double vision.  Respiratory:  Negative for cough, hemoptysis, sputum production and wheezing.   Cardiovascular:  Negative for chest pain, palpitations and orthopnea.  Gastrointestinal:  Negative for abdominal pain, blood in stool, constipation, diarrhea, heartburn, melena, nausea and vomiting.  Genitourinary:  Negative for dysuria, frequency and urgency.  Musculoskeletal:  Negative for back pain and joint pain.  Skin: Negative.  Negative for itching and rash.  Neurological:  Negative for dizziness, tingling, focal weakness, weakness and headaches.  Endo/Heme/Allergies:  Does not bruise/bleed easily.  Psychiatric/Behavioral:  Negative for depression. The patient is not nervous/anxious and does not have insomnia.      MEDICAL HISTORY:  Past Medical History:  Diagnosis Date   Cancer (HCC)    COPD (chronic obstructive pulmonary disease) (HCC)    Hypertension     SURGICAL HISTORY:  Past Surgical History:  Procedure Laterality Date   CHOLECYSTECTOMY     Around 2001   COLONOSCOPY WITH PROPOFOL N/A 04/12/2020   Procedure: COLONOSCOPY WITH PROPOFOL;  Surgeon:  Earline Mayotte, MD;  Location: [facility] END[facility]COPY;  Service: Endoscopy;  Laterality: N/A;   COLONOSCOPY WITH PROPOFOL N/A 04/24/2020   Procedure: COLONOSCOPY WITH PROPOFOL;  Surgeon: Earline Mayotte, MD;  Location: [facility] ENDOSCOPY;  Service: Endoscopy;  Laterality: N/A;   COLONOSCOPY WITH PROPOFOL N/A 09/10/2021   Procedure: COLONOSCOPY WITH PROPOFOL;  Surgeon: Earline Mayotte, MD;  Location: [facility] ENDOSCOPY;  Service: Endoscopy;  Laterality: N/A;   ESOPHAGOGASTRODUODENOSCOPY (EGD) WITH PROPOFOL N/A 04/12/2020   Procedure: ESOPHAGOGASTRODUODENOSCOPY (EGD) WITH PROPOFOL;  Surgeon: Earline Mayotte, MD;  Location: [facility] ENDOSCOPY;  Service: Endoscopy;  Laterality: N/A;   EYE SURGERY Bilateral    cataract   LAPAROSCOPIC PARTIAL COLECTOMY Right 05/20/2020   Procedure: LAPAROSCOPIC PARTIAL COLECTOMY;  Surgeon: Earline Mayotte, MD;  Location: [facility] ORS;  Service: General;  Laterality: Right;  Sonda Rumble, RNFA to assist   PORTACATH PLACEMENT Left 06/19/2020   Procedure: INSERTION PORT-A-CATH;  Surgeon: Earline Mayotte, MD;  Location: [facility] ORS;  Service: General;  Laterality: Left;    SOCIAL HISTORY: Social History   Socioeconomic History   Marital status: Married    Spouse name: Not on file   Number of children: Not on file   Years of education: Not on file   Highest education level: Not on file  Occupational History   Not on file  Tobacco Use   Smoking status: Former    Current packs/day: 0.00    Types: Cigarettes    Start date: 11/23/1965    Quit date: 11/23/1968    Years since quitting: 55.1   Smokeless tobacco: Never  Vaping Use   Vaping status: Never Used  Substance and Sexual Activity   Alcohol use: Not Currently   Drug use: Never   Sexual activity: Not on file  Other Topics Concern   Not on file  Social History Narrative   Lives in [place]; smoked- quit in 70s; beer social; 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; grandson    Social Drivers of Corporate investment banker  Strain: Not on file  Food Insecurity: Not on file  Transportation Needs: Not on file  Physical Activity: Not on file  Stress: Not on file  Social Connections: Not on file  Intimate Partner Violence: Not on file    FAMILY HISTORY: Family History  Problem Relation Age of Onset   Multiple sclerosis Brother     ALLERGIES:  has no known allergies.  MEDICATIONS:  Current Outpatient Medications  Medication Sig Dispense Refill   BREO ELLIPTA 100-25 MCG/INH AEPB Inhale 1 puff into the lungs at bedtime.     Butalbital-APAP-Caffeine 50-300-40 MG CAPS Take 1-2 tablets by mouth every 8 (eight) hours as needed.     fexofenadine (ALLEGRA) 180 MG tablet Take 180 mg by mouth daily.     lidocaine-prilocaine (EMLA) cream Apply 1 application. topically as needed. 30 g 3   lisinopril (ZESTRIL) 10 MG tablet Take 10 mg by mouth daily.     potassium chloride SA (KLOR-CON M20) 20 MEQ tablet TAKE 1 TABLET BY MOUTH TWICE A DAY 60 tablet 3   SYMBICORT 160-4.5 MCG/ACT inhaler Inhale into the lungs.     tiotropium (SPIRIVA) 18 MCG inhalation capsule Place 18 mcg into inhaler and inhale at bedtime.      fexofenadine (ALLEGRA) 180 MG tablet Take 1  tablet by mouth daily. (Patient not taking: Reported on 10/26/2022)     GI Cocktail (alum & mag hydroxide-simethicone/lidocaine)oral mixture Each dose to contain 15mL Maalox and 15mL viscous lidocaine (Patient not taking: Reported on 10/26/2022) 300 mL 0   ondansetron (ZOFRAN) 8 MG tablet One pill every 8 hours as needed for nausea/vomitting. (Patient not taking: Reported on 10/26/2022) 40 tablet 1   prochlorperazine (COMPAZINE) 10 MG tablet Take 1 tablet (10 mg total) by mouth every 6 (six) hours as needed for nausea or vomiting. (Patient not taking: Reported on 10/26/2022) 40 tablet 1   No current facility-administered medications for this visit.   Facility-Administered Medications Ordered in Other Visits  Medication Dose Route Frequency Provider Last Rate Last Admin    heparin lock flush 100 unit/mL  500 Units Intravenous Once Louretta Shorten R, MD          .  PHYSICAL EXAMINATION: ECOG PERFORMANCE STATUS: 0 - Asymptomatic  Vitals:   01/19/24 0912  BP: (!) 144/72  Pulse: (!) 55  Temp: (!) 96.5 F (35.8 C)  SpO2: 100%   Filed Weights   01/19/24 0912  Weight: 236 lb 9.6 oz (107.3 kg)     Physical Exam HENT:     Head: Normocephalic and atraumatic.     Mouth/Throat:     Pharynx: No oropharyngeal exudate.  Eyes:     Pupils: Pupils are equal, round, and reactive to light.  Cardiovascular:     Rate and Rhythm: Normal rate and regular rhythm.  Pulmonary:     Effort: No respiratory distress.     Breath sounds: No wheezing.     Comments: Decreased breath sounds Abdominal:     General: Bowel sounds are normal. There is no distension.     Palpations: Abdomen is soft. There is no mass.     Tenderness: There is no abdominal tenderness. There is no guarding or rebound.  Musculoskeletal:        General: No tenderness. Normal range of motion.     Cervical back: Normal range of motion and neck supple.  Skin:    General: Skin is warm.  Neurological:     Mental Status: He is alert and oriented to person, place, and time.  Psychiatric:        Mood and Affect: Affect normal.      LABORATORY DATA:  I have reviewed the data as listed Lab Results  Component Value Date   WBC 8.4 01/19/2024   HGB 13.2 01/19/2024   HCT 40.3 01/19/2024   MCV 96.0 01/19/2024   PLT 223 01/19/2024   Recent Labs    05/21/23 1004 12/20/23 0937 01/19/24 0900  NA 140 138 140  K 3.4* 4.7 4.5  CL 105 103 105  CO2 28 27 29   GLUCOSE 101* 96 101*  BUN 12 26* 17  CREATININE 0.85 1.06 0.93  CALCIUM 8.6* 9.2 9.1  GFRNONAA >60 >60 >60  PROT 6.5 7.0 6.9  ALBUMIN 3.6 4.0 3.7  AST 17 18 17   ALT 13 15 13   ALKPHOS 44 48 41  BILITOT 2.1* 1.9* 1.9*    RADIOGRAPHIC STUDIES: I have personally reviewed the radiological images as listed and agreed with the  findings in the report. CT CHEST ABDOMEN PELVIS W CONTRAST Result Date: 01/05/2024 CLINICAL DATA:  Colon cancer status post resection, elevated CEA * Tracking Code: BO * EXAM: CT CHEST, ABDOMEN, AND PELVIS WITH CONTRAST TECHNIQUE: Multidetector CT imaging of the chest, abdomen and pelvis was performed following the  standard protocol during bolus administration of intravenous contrast. RADIATION DOSE REDUCTION: This exam was performed according to the departmental dose-optimization program which includes automated exposure control, adjustment of the mA and/or kV according to patient size and/or use of iterative reconstruction technique. CONTRAST:  OMNIPAQUE IOHEXOL 300 MG/ML SOLN additional oral enteric contrast COMPARISON:  05/28/2023 FINDINGS: CT CHEST FINDINGS Cardiovascular: Aortic atherosclerosis. Normal heart size. Left coronary artery calcifications. No pericardial effusion. Mediastinum/Nodes: No enlarged mediastinal, hilar, or axillary lymph nodes. Thyroid gland, trachea, and esophagus demonstrate no significant findings. Lungs/Pleura: Bronchial wall thickening and bronchiolar plugging in the bilateral lung bases. Mild bibasilar scarring or atelectasis. No pleural effusion or pneumothorax. Musculoskeletal: No chest wall abnormality. No acute osseous findings. CT ABDOMEN PELVIS FINDINGS Hepatobiliary: No focal liver abnormality is seen. Status post cholecystectomy. No biliary dilatation. Pancreas: Unremarkable. No pancreatic ductal dilatation or surrounding inflammatory changes. Spleen: Mild splenomegaly, maximum span 14.3 cm. Adrenals/Urinary Tract: Adrenal glands are unremarkable. Kidneys are normal, without renal calculi, solid lesion, or hydronephrosis. Bladder is unremarkable. Stomach/Bowel: Stomach is within normal limits. Status post right hemicolectomy and reanastomosis. No evidence of bowel wall thickening, distention, or inflammatory changes. Colon is fluid-filled to the rectum.  Vascular/Lymphatic: Aortic atherosclerosis. No enlarged abdominal or pelvic lymph nodes. Reproductive: Prostatomegaly. Other: No abdominal wall hernia or abnormality. No ascites. Musculoskeletal: No acute osseous findings. Please note that definitively benign incidental findings, functional findings, and anatomic variants may have been intentionally omitted from this report in the interest of brevity and clarity. If not specifically noted and recommended in the impression, no further follow-up or characterization is required for incidental findings. IMPRESSION: 1. Status post right hemicolectomy and reanastomosis. 2. No evidence of lymphadenopathy or metastatic disease in the chest, abdomen, or pelvis. 3. Bronchial wall thickening and bronchiolar plugging in the bilateral lung bases, consistent with nonspecific infectious or inflammatory bronchitis, possibly related to aspiration. 4. Colon is fluid-filled to the rectum, suggestive of diarrheal illness. 5. Mild splenomegaly. 6. Coronary artery disease. Aortic Atherosclerosis (ICD10-I70.0). Electronically Signed   By: Jearld Lesch M.D.   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 S/p  adjuvant CIV 5FU x12 cycles [finished Jan 18th, 2022]. JAN, 24th- 2023-CT CAP- NED- see below; s/p Dr. Lemar Livings [OCT 2022]- wnl. ? surveillance colonoscopy in 3-5 years. [facility]- [facility] referral re: surveillance colonoscopy/ hx of colon cancer  # FEB 2025- CT CAP-  Status post right hemicolectomy with no findings to suggest recurrent or metastatic disease in the chest, abdomen or pelvis. Stable.   #Elevated tumor marker- FEB 2025- 17-waxing & waning]- Awaiting labs from today. However, FEb 2025-- CT negative; see above;  Monitor closely.   # COPD- continue inhalers- stable.      # PIV- s/p port explanation.   #Incidental findings on Imaging  CT scan CAP 2025 : emphysema; pulmonary hypertension;  athrrosclerosis; cystsI reviewed/discussed/counseled the patient.   # DISPOSITION: #  [facility]- [facility] referral re: surveillance colonoscopy/ hx of colon cancer  # follow up in 6 months-  MD; labs- cbc/cmp;CEA;- Dr.B     All questions were answered. The patient knows to call the clinic with any problems, questions or concerns.    Earna Coder, MD 01/19/2024 10:07 AM

## 2024-01-20 LAB — CEA: CEA: 15.8 ng/mL — ABNORMAL HIGH (ref 0.0–4.7)

## 2024-06-06 ENCOUNTER — Other Ambulatory Visit: Payer: Medicare HMO

## 2024-06-19 ENCOUNTER — Ambulatory Visit: Payer: Medicare HMO | Admitting: Internal Medicine

## 2024-06-19 ENCOUNTER — Other Ambulatory Visit: Payer: Medicare HMO

## 2024-06-20 ENCOUNTER — Ambulatory Visit: Admitting: Anesthesiology

## 2024-06-20 ENCOUNTER — Ambulatory Visit
Admission: RE | Admit: 2024-06-20 | Discharge: 2024-06-20 | Disposition: A | Discharge status: Home / Self Care | Attending: Gastroenterology | Admitting: Gastroenterology

## 2024-06-20 ENCOUNTER — Encounter
Admission: RE | Disposition: A | Discharge status: Home / Self Care | Payer: Self-pay | Source: Home / Self Care | Attending: Gastroenterology

## 2024-06-20 DIAGNOSIS — J449 Chronic obstructive pulmonary disease, unspecified: Secondary | ICD-10-CM | POA: Insufficient documentation

## 2024-06-20 DIAGNOSIS — E669 Obesity, unspecified: Secondary | ICD-10-CM | POA: Insufficient documentation

## 2024-06-20 DIAGNOSIS — Z1211 Encounter for screening for malignant neoplasm of colon: Secondary | ICD-10-CM | POA: Diagnosis present

## 2024-06-20 DIAGNOSIS — K640 First degree hemorrhoids: Secondary | ICD-10-CM | POA: Insufficient documentation

## 2024-06-20 DIAGNOSIS — Z79899 Other long term (current) drug therapy: Secondary | ICD-10-CM | POA: Diagnosis not present

## 2024-06-20 DIAGNOSIS — G4730 Sleep apnea, unspecified: Secondary | ICD-10-CM | POA: Diagnosis not present

## 2024-06-20 DIAGNOSIS — Z87891 Personal history of nicotine dependence: Secondary | ICD-10-CM | POA: Insufficient documentation

## 2024-06-20 DIAGNOSIS — Z6831 Body mass index (BMI) 31.0-31.9, adult: Secondary | ICD-10-CM | POA: Insufficient documentation

## 2024-06-20 DIAGNOSIS — I10 Essential (primary) hypertension: Secondary | ICD-10-CM | POA: Diagnosis not present

## 2024-06-20 DIAGNOSIS — D122 Benign neoplasm of ascending colon: Secondary | ICD-10-CM | POA: Insufficient documentation

## 2024-06-20 DIAGNOSIS — Z85038 Personal history of other malignant neoplasm of large intestine: Secondary | ICD-10-CM | POA: Diagnosis not present

## 2024-06-20 DIAGNOSIS — Z980 Intestinal bypass and anastomosis status: Secondary | ICD-10-CM | POA: Insufficient documentation

## 2024-06-20 DIAGNOSIS — Z7951 Long term (current) use of inhaled steroids: Secondary | ICD-10-CM | POA: Insufficient documentation

## 2024-06-20 HISTORY — DX: Sleep apnea, unspecified: G47.30

## 2024-06-20 HISTORY — PX: POLYPECTOMY: SHX149

## 2024-06-20 HISTORY — PX: COLONOSCOPY: SHX5424

## 2024-06-20 SURGERY — COLONOSCOPY
Anesthesia: General

## 2024-06-20 MED ORDER — PROPOFOL 500 MG/50ML IV EMUL
INTRAVENOUS | Status: DC | PRN
  Administered 2024-06-20: 140 ug/kg/min via INTRAVENOUS

## 2024-06-20 MED ORDER — PROPOFOL 10 MG/ML IV BOLUS
INTRAVENOUS | Status: DC | PRN
  Administered 2024-06-20: 70 mg via INTRAVENOUS

## 2024-06-20 MED ORDER — SODIUM CHLORIDE 0.9 % IV SOLN: INTRAVENOUS | Status: DC

## 2024-06-20 NOTE — Op Note (Signed)
 [facility] Patient Name: James Hodge Procedure Date: 06/20/2024 1:34 PM MRN: [id number] Account #: [id number] Date of Birth: May 16, 1946 Admit Type: Outpatient Age: 78 Room: [facility] ENDO ROOM 1 Gender: Male Note Status: Finalized Instrument Name: Arvis [id number] Procedure:             Colonoscopy Indications:           High risk colon cancer surveillance: Personal history                         of colon cancer Providers:             Ole Schick MD, MD Referring MD:          Honor BROCKS. Corlis, MD (Referring MD) Medicines:             Monitored Anesthesia Care Complications:         No immediate complications. Estimated blood loss:                         Minimal. Procedure:             Pre-Anesthesia Assessment:                        - Prior to the procedure, a History and Physical was                         performed, and patient medications and allergies were                         reviewed. The patient is competent. The risks and                         benefits of the procedure and the sedation options and                         risks were discussed with the patient. All questions                         were answered and informed consent was obtained.                         Patient identification and proposed procedure were                         verified by the physician, the nurse, the                         anesthesiologist, the anesthetist and the technician                         in the endoscopy suite. Mental Status Examination:                         alert and oriented. Airway Examination: normal                         oropharyngeal airway and neck mobility. Respiratory  Examination: clear to auscultation. CV Examination:                         normal. Prophylactic Antibiotics: The patient does not                         require prophylactic antibiotics. Prior                          Anticoagulants: The patient has taken no anticoagulant                         or antiplatelet agents. ASA Grade Assessment: II - A                         patient with mild systemic disease. After reviewing                         the risks and benefits, the patient was deemed in                         satisfactory condition to undergo the procedure. The                         anesthesia plan was to use monitored anesthesia care                         (MAC). Immediately prior to administration of                         medications, the patient was re-assessed for adequacy                         to receive sedatives. The heart rate, respiratory                         rate, oxygen saturations, blood pressure, adequacy of                         pulmonary ventilation, and response to care were                         monitored throughout the procedure. The physical                         status of the patient was re-assessed after the                         procedure.                        After obtaining informed consent, the colonoscope was                         passed under direct vision. Throughout the procedure,                         the patient's blood pressure, pulse, and oxygen  saturations were monitored continuously. The                         Colonoscope was introduced through the anus and                         advanced to the the ileocolonic anastomosis. The                         colonoscopy was performed without difficulty. The                         patient tolerated the procedure well. The quality of                         the bowel preparation was good. The rectum was                         photographed. Findings:      The perianal and digital rectal examinations were normal.      There was evidence of a prior end-to-side ileo-colonic anastomosis in       the ascending colon. This was patent and was characterized by healthy        appearing mucosa.      A 4 mm polyp was found in the anastomosis. The polyp was sessile. The       polyp was removed with a cold snare. Resection and retrieval were       complete. Estimated blood loss was minimal.      Internal hemorrhoids were found during retroflexion. The hemorrhoids       were Grade I (internal hemorrhoids that do not prolapse).      The exam was otherwise without abnormality on direct and retroflexion       views. Impression:            - Patent end-to-side ileo-colonic anastomosis,                         characterized by healthy appearing mucosa.                        - One 4 mm polyp at the anastomosis, removed with a                         cold snare. Resected and retrieved.                        - Internal hemorrhoids.                        - The examination was otherwise normal on direct and                         retroflexion views. Recommendation:        - Discharge patient to home.                        - Resume previous diet.                        - Continue present medications.                        -  Await pathology results.                        - Repeat colonoscopy in 5 years for surveillance if                         benefits outweigh risks.                        - Return to referring physician as previously                         scheduled. Procedure Code(s):     --- Professional ---                        [phone], Colonoscopy, flexible; with removal of                         tumor(s), polyp(s), or other lesion(s) by snare                         technique Diagnosis Code(s):     --- Professional ---                        S14.961, Personal history of other malignant neoplasm                         of large intestine                        Z98.0, Intestinal bypass and anastomosis status                        D12.6, Benign neoplasm of colon, unspecified                        K64.0, First degree hemorrhoids CPT copyright 2022 American  Medical Association. All rights reserved. The codes documented in this report are preliminary and upon coder review may  be revised to meet current compliance requirements. Ole Schick MD, MD 06/20/2024 2:08:32 PM Number of Addenda: 0 Note Initiated On: 06/20/2024 1:34 PM Scope Withdrawal Time: 0 hours 7 minutes 49 seconds  Total Procedure Duration: 0 hours 15 minutes 59 seconds  Estimated Blood Loss:  Estimated blood loss was minimal.      [facility]

## 2024-06-20 NOTE — Transfer of Care (Signed)
 Immediate Anesthesia Transfer of Care Note  Patient: James Hodge  Procedure(s) Performed: COLONOSCOPY POLYPECTOMY, INTESTINE  Patient Location: PACU  Anesthesia Type:General  Level of Consciousness: awake, alert , and oriented  Airway & Oxygen Therapy: Patient Spontanous Breathing  Post-op Assessment: Report given to RN and Post -op Vital signs reviewed and stable  Post vital signs: Reviewed and stable  Last Vitals:  Vitals Value Taken Time  BP 107/59 06/20/24 14:10  Temp    Pulse 65 06/20/24 14:13  Resp 11 06/20/24 14:13  SpO2 95 % 06/20/24 14:13  Vitals shown include unfiled device data.  Last Pain:  Vitals:   06/20/24 1258  TempSrc: Temporal  PainSc: 0-No pain         Complications: No notable events documented.

## 2024-06-20 NOTE — Interval H&P Note (Signed)
 History and Physical Interval Note:  06/20/2024 1:37 PM  James Hodge  has presented today for surgery, with the diagnosis of Malignant Neoplasm of Ascending Colon.  The various methods of treatment have been discussed with the patient and family. After consideration of risks, benefits and other options for treatment, the patient has consented to  Procedure(s): COLONOSCOPY (N/A) as a surgical intervention.  The patient's history has been reviewed, patient examined, no change in status, stable for surgery.  I have reviewed the patient's chart and labs.  Questions were answered to the patient's satisfaction.     [name]  Ok to proceed with colonoscopy

## 2024-06-20 NOTE — H&P (Signed)
 Outpatient short stay form Pre-procedure 06/20/2024  James ONEIDA Schick, MD  Primary Physician: Corlis Honor BROCKS, MD  Reason for visit:  Surveillance  History of present illness:    77 y/o gentleman with history of COPD and colon cancer s/p right hemicolectomy here for surveillance colonoscopy. Last colonoscopy in 3-4 years ago was unremarkable. No blood thinners.    Current Facility-Administered Medications:    0.9 %  sodium chloride  infusion, , Intravenous, Continuous, Nery Frappier, James ONEIDA, MD, Last Rate: 20 mL/hr at 06/20/24 1308, New Bag at 06/20/24 1308  Facility-Administered Medications Ordered in Other Encounters:    heparin  lock flush 100 unit/mL, 500 Units, Intravenous, Once, Brahmanday, Govinda R, MD  Medications Prior to Admission  Medication Sig Dispense Refill Last Dose/Taking   BREO ELLIPTA  100-25 MCG/INH AEPB Inhale 1 puff into the lungs at bedtime.      Butalbital-APAP-Caffeine 50-300-40 MG CAPS Take 1-2 tablets by mouth every 8 (eight) hours as needed.      fexofenadine (ALLEGRA) 180 MG tablet Take 180 mg by mouth daily.      fexofenadine (ALLEGRA) 180 MG tablet Take 1 tablet by mouth daily. (Patient not taking: Reported on 10/26/2022)      GI Cocktail (alum & mag hydroxide-simethicone /lidocaine )oral mixture Each dose to contain 15mL Maalox and 15mL viscous lidocaine  (Patient not taking: Reported on 10/26/2022) 300 mL 0    lidocaine -prilocaine  (EMLA ) cream Apply 1 application. topically as needed. 30 g 3    lisinopril  (ZESTRIL ) 10 MG tablet Take 10 mg by mouth daily.      ondansetron  (ZOFRAN ) 8 MG tablet One pill every 8 hours as needed for nausea/vomitting. (Patient not taking: Reported on 10/26/2022) 40 tablet 1    potassium chloride  SA (KLOR-CON  M20) 20 MEQ tablet TAKE 1 TABLET BY MOUTH TWICE A DAY 60 tablet 3    prochlorperazine  (COMPAZINE ) 10 MG tablet Take 1 tablet (10 mg total) by mouth every 6 (six) hours as needed for nausea or vomiting. (Patient not taking: Reported  on 10/26/2022) 40 tablet 1    SYMBICORT 160-4.5 MCG/ACT inhaler Inhale into the lungs.      tiotropium (SPIRIVA ) 18 MCG inhalation capsule Place 18 mcg into inhaler and inhale at bedtime.         No Known Allergies   Past Medical History:  Diagnosis Date   Cancer (HCC)    COPD (chronic obstructive pulmonary disease) (HCC)    Hypertension    Sleep apnea     Review of systems:  Otherwise negative.    Physical Exam  Gen: Alert, oriented. Appears stated age.  HEENT: PERRLA. Lungs: No respiratory distress CV: RRR Abd: soft, benign, no masses Ext: No edema    Planned procedures: Proceed with colonoscopy. The patient understands the nature of the planned procedure, indications, risks, alternatives and potential complications including but not limited to bleeding, infection, perforation, damage to internal organs and possible oversedation/side effects from anesthesia. The patient agrees and gives consent to proceed.  Please refer to procedure notes for findings, recommendations and patient disposition/instructions.     James ONEIDA Schick, MD [facility]

## 2024-06-20 NOTE — Anesthesia Preprocedure Evaluation (Addendum)
 Anesthesia Evaluation  Patient identified by MRN, date of birth, ID band Patient awake    Reviewed: Allergy & Precautions, NPO status , Patient's Chart, lab work & pertinent test results  History of Anesthesia Complications Negative for: history of anesthetic complications  Airway Mallampati: IV   Neck ROM: Full    Dental  (+) Upper Dentures, Lower Dentures   Pulmonary COPD, former smoker (quit 1970)   Pulmonary exam normal breath sounds clear to auscultation       Cardiovascular hypertension, Normal cardiovascular exam Rhythm:Regular Rate:Normal     Neuro/Psych negative neurological ROS     GI/Hepatic Colon CA   Endo/Other  Obesity   Renal/GU negative Renal ROS     Musculoskeletal   Abdominal   Peds  Hematology negative hematology ROS (+)   Anesthesia Other Findings   Reproductive/Obstetrics                              Anesthesia Physical Anesthesia Plan  ASA: 2  Anesthesia Plan: General   Post-op Pain Management:    Induction: Intravenous  PONV Risk Score and Plan: 2 and Propofol  infusion, TIVA and Treatment may vary due to age or medical condition  Airway Management Planned: Natural Airway  Additional Equipment:   Intra-op Plan:   Post-operative Plan:   Informed Consent: I have reviewed the patients History and Physical, chart, labs and discussed the procedure including the risks, benefits and alternatives for the proposed anesthesia with the patient or authorized representative who has indicated his/her understanding and acceptance.       Plan Discussed with: CRNA  Anesthesia Plan Comments: (LMA/GETA backup discussed.  Patient consented for risks of anesthesia including but not limited to:  - adverse reactions to medications - damage to eyes, teeth, lips or other oral mucosa - nerve damage due to positioning  - sore throat or hoarseness - damage to heart,  brain, nerves, lungs, other parts of body or loss of life  Informed patient about role of CRNA in peri- and intra-operative care.  Patient voiced understanding.)         Anesthesia Quick Evaluation

## 2024-06-21 ENCOUNTER — Encounter: Payer: Self-pay | Admitting: Gastroenterology

## 2024-06-21 NOTE — Anesthesia Postprocedure Evaluation (Signed)
 Anesthesia Post Note  Patient: James Hodge  Procedure(s) Performed: COLONOSCOPY POLYPECTOMY, INTESTINE  Patient location during evaluation: PACU Anesthesia Type: General Level of consciousness: awake and alert, oriented and patient cooperative Pain management: pain level controlled Vital Signs Assessment: post-procedure vital signs reviewed and stable Respiratory status: spontaneous breathing, nonlabored ventilation and respiratory function stable Cardiovascular status: blood pressure returned to baseline and stable Postop Assessment: adequate PO intake Anesthetic complications: no   No notable events documented.   Last Vitals:  Vitals:   06/20/24 1430 06/20/24 1438  BP: (!) 150/74   Pulse:    Resp: 15 13  Temp:    SpO2:      Last Pain:  Vitals:   06/20/24 1400  TempSrc: Temporal  PainSc:                  Alfonso Ruths

## 2024-06-22 LAB — SURGICAL PATHOLOGY

## 2024-07-18 ENCOUNTER — Encounter: Payer: Self-pay | Admitting: Internal Medicine

## 2024-07-18 ENCOUNTER — Inpatient Hospital Stay: Payer: Medicare HMO | Attending: Internal Medicine

## 2024-07-18 ENCOUNTER — Inpatient Hospital Stay (HOSPITAL_BASED_OUTPATIENT_CLINIC_OR_DEPARTMENT_OTHER): Payer: Medicare HMO | Admitting: Internal Medicine

## 2024-07-18 VITALS — BP 122/74 | HR 61 | Temp 97.6°F | Resp 18 | Ht 73.0 in | Wt 248.7 lb

## 2024-07-18 DIAGNOSIS — Z87891 Personal history of nicotine dependence: Secondary | ICD-10-CM | POA: Diagnosis not present

## 2024-07-18 DIAGNOSIS — J449 Chronic obstructive pulmonary disease, unspecified: Secondary | ICD-10-CM | POA: Diagnosis not present

## 2024-07-18 DIAGNOSIS — Z79899 Other long term (current) drug therapy: Secondary | ICD-10-CM | POA: Diagnosis not present

## 2024-07-18 DIAGNOSIS — C182 Malignant neoplasm of ascending colon: Secondary | ICD-10-CM | POA: Insufficient documentation

## 2024-07-18 LAB — CBC WITH DIFFERENTIAL (CANCER CENTER ONLY)
Abs Immature Granulocytes: 0.06 K/uL (ref 0.00–0.07)
Basophils Absolute: 0.1 K/uL (ref 0.0–0.1)
Basophils Relative: 1 %
Eosinophils Absolute: 0.4 K/uL (ref 0.0–0.5)
Eosinophils Relative: 4 %
HCT: 39.7 % (ref 39.0–52.0)
Hemoglobin: 13.2 g/dL (ref 13.0–17.0)
Immature Granulocytes: 1 %
Lymphocytes Relative: 24 %
Lymphs Abs: 2.4 K/uL (ref 0.7–4.0)
MCH: 30.7 pg (ref 26.0–34.0)
MCHC: 33.2 g/dL (ref 30.0–36.0)
MCV: 92.3 fL (ref 80.0–100.0)
Monocytes Absolute: 0.9 K/uL (ref 0.1–1.0)
Monocytes Relative: 9 %
Neutro Abs: 6.2 K/uL (ref 1.7–7.7)
Neutrophils Relative %: 61 %
Platelet Count: 225 K/uL (ref 150–400)
RBC: 4.3 MIL/uL (ref 4.22–5.81)
RDW: 13.2 % (ref 11.5–15.5)
WBC Count: 10 K/uL (ref 4.0–10.5)
nRBC: 0 % (ref 0.0–0.2)

## 2024-07-18 LAB — CMP (CANCER CENTER ONLY)
ALT: 16 U/L (ref 0–44)
AST: 20 U/L (ref 15–41)
Albumin: 3.6 g/dL (ref 3.5–5.0)
Alkaline Phosphatase: 44 U/L (ref 38–126)
Anion gap: 6 (ref 5–15)
BUN: 16 mg/dL (ref 8–23)
CO2: 26 mmol/L (ref 22–32)
Calcium: 8.9 mg/dL (ref 8.9–10.3)
Chloride: 107 mmol/L (ref 98–111)
Creatinine: 1 mg/dL (ref 0.61–1.24)
GFR, Estimated: >60 mL/min (ref >60)
Glucose, Bld: 106 mg/dL — ABNORMAL HIGH (ref 70–99)
Potassium: 3.9 mmol/L (ref 3.5–5.1)
Sodium: 139 mmol/L (ref 135–145)
Total Bilirubin: 2 mg/dL — ABNORMAL HIGH (ref 0.0–1.2)
Total Protein: 6.8 g/dL (ref 6.5–8.1)

## 2024-07-18 NOTE — Assessment & Plan Note (Signed)
#  Colon cancer stage III [July 2021]-PT4BN2B- high risk. S/p  adjuvant CIV 5FU x12 cycles [finished Jan 18th, 2022]. JULY 2025- colonoscopy- adenoma- repeat in 5 years [Dr.Russow].   # FEB 2025- CT CAP-  Status post right hemicolectomy with no findings to suggest recurrent or metastatic disease in the chest, abdomen or pelvis- stable; will repeat imaging in 6 months.   #Elevated tumor marker- FEB 2025- 17-waxing & waning]- Awaiting labs from today. However, FEb 2025-- CT negative; see above;  Monitor closely.   # COPD- continue inhalers- stable.      # PIV- s/p port explanation.   # DISPOSITION: # follow up in 6 months-  MD; labs- cbc/cmp;CEA;-CT CAP-  Dr.B

## 2024-07-18 NOTE — Progress Notes (Signed)
 [facility] CONSULT NOTE  Patient Care Team: Corlis Honor BROCKS, MD as PCP - General (Internal Medicine) Rennie James SAUNDERS, MD as Consulting Physician (Oncology) Dessa Reyes ORN, MD as Consulting Physician (General Surgery)  CHIEF COMPLAINTS/PURPOSE OF CONSULTATION: Colon cancer  #  Oncology History Overview Note  # July 2021- RIGHT COLON ADENO CARCINOMA- Dr.Byrnett s/p right hemicolectomy- pT4b N2b- STAGE III [high risk]; MSI-STABLE.   # AUG 10th, 2021- FOLFOX [adjuvant];  OCT 2022- surveillance colonocsopy. s/p  Dr. Dessa; repeat in 3 years  -------------------------------------------------------------------------------------------   - INVASIVE MODERATELY DIFFERENTIATED ADENOCARCINOMA.  - TEN OF FIFTY-TWO LYMPH NODES POSITIVE FOR METASTATIC ADENOCARCINOMA  (10/52).  - SINGLE INCIDENTAL TUBULOVILLOUS ADENOMA.  - THREE INCIDENTAL TUBULAR ADENOMAS.  - SEE CANCER SUMMARY.   CANCER CASE SUMMARY: COLON AND RECTUM  Procedure: Right hemicolectomy  Tumor Site: Cecum  Tumor Size: Greatest dimension: 12.2 cm  Macroscopic Tumor Perforation: Present  Histologic Type: Adenocarcinoma  Histologic Grade: G2: Moderately differentiated  Tumor Extension: Tumor directly invades adjacent structures (terminal  ileum)  Margins: All margins are uninvolved by invasive carcinoma, high-grade  dysplasia/intramucosal adenocarcinoma, and low-grade dysplasia  Treatment Effect: No known presurgical therapy  Lymphovascular Invasion: Present  Perineural Invasion: Present  Tumor Deposits: Present: Multiple  Regional Lymph Nodes:       Number of Lymph Nodes Involved: 10       Number of Lymph Nodes Examined: 52  -  # COPD- [Dr.Fleming];   # SURVIVORSHIP:   # GENETICS:   DIAGNOSIS: colon cancer  STAGE:   III      ;  GOALS:cure     Cancer of right colon (HCC)  05/20/2020 Initial Diagnosis   Cancer of right colon (HCC)   07/02/2020 - 12/12/2020 Chemotherapy   Patient is on  Treatment Plan : COLORECTAL 5FU q14d x 6 months     12/08/2020 Cancer Staging   Staging form: Colon and Rectum, AJCC 8th Edition - Clinical: Stage IIIC (cT4b, cN2b, cM0) - Signed by Rennie James SAUNDERS, MD on 12/08/2020     HISTORY OF PRESENTING ILLNESS: Alone.  Walks independently.  James Hodge 78 y.o.  male high risk stage III colon cancer currently s/p adjuvant therapy on surveillance is here for follow-up.  In the interim had colonoscopy.   Patient has not had any hospitalizations.  Denies any weight loss.  Denies any nausea vomiting abdominal pain.  No blood in stools or black-colored stools.   Review of Systems  Constitutional:  Negative for chills, diaphoresis, fever and malaise/fatigue.  HENT:  Negative for nosebleeds and sore throat.   Eyes:  Negative for double vision.  Respiratory:  Negative for cough, hemoptysis, sputum production and wheezing.   Cardiovascular:  Negative for chest pain, palpitations and orthopnea.  Gastrointestinal:  Negative for abdominal pain, blood in stool, constipation, diarrhea, heartburn, melena, nausea and vomiting.  Genitourinary:  Negative for dysuria, frequency and urgency.  Musculoskeletal:  Negative for back pain and joint pain.  Skin: Negative.  Negative for itching and rash.  Neurological:  Negative for dizziness, tingling, focal weakness, weakness and headaches.  Endo/Heme/Allergies:  Does not bruise/bleed easily.  Psychiatric/Behavioral:  Negative for depression. The patient is not nervous/anxious and does not have insomnia.      MEDICAL HISTORY:  Past Medical History:  Diagnosis Date   Cancer (HCC)    COPD (chronic obstructive pulmonary disease) (HCC)    Hypertension    Sleep apnea     SURGICAL HISTORY: Past Surgical History:  Procedure  Laterality Date   CHOLECYSTECTOMY     Around 2001   COLONOSCOPY N/A 06/20/2024   Procedure: COLONOSCOPY;  Surgeon: Maryruth Ole DASEN, MD;  Location[facility] ENDOSCOPY;  Service: Endoscopy;   Laterality: N/A;   COLONOSCOPY WITH PROPOFOL  N/A 04/12/2020   Procedure: COLONOSCOPY WITH PROPOFOL ;  Surgeon: Dessa Reyes ORN, MD;  Location: [facility] ENDOSCOPY;  Service: Endoscopy;  Laterality: N/A;   COLONOSCOPY WITH PROPOFOL  N/A 04/24/2020   Procedure: COLONOSCOPY WITH PROPOFOL ;  Surgeon: Dessa Reyes ORN, MD;  Location: [facility] ENDOSCOPY;  Service: Endoscopy;  Laterality: N/A;   COLONOSCOPY WITH PROPOFOL  N/A 09/10/2021   Procedure: COLONOSCOPY WITH PROPOFOL ;  Surgeon: Dessa Reyes ORN, MD;  Location: [facility] ENDOSCOPY;  Service: Endoscopy;  Laterality: N/A;   ESOPHAGOGASTRODUODENOSCOPY (EGD) WITH PROPOFOL  N/A 04/12/2020   Procedure: ESOPHAGOGASTRODUODENOSCOPY (EGD) WITH PROPOFOL ;  Surgeon: Dessa Reyes ORN, MD;  Location: [facility] ENDOSCOPY;  Service: Endoscopy;  Laterality: N/A;   EYE SURGERY Bilateral    cataract   LAPAROSCOPIC PARTIAL COLECTOMY Right 05/20/2020   Procedure: LAPAROSCOPIC PARTIAL COLECTOMY;  Surgeon: Dessa Reyes ORN, MD;  Location: [facility] ORS;  Service: General;  Laterality: Right;  Shelba Rakers, RNFA to assist   POLYPECTOMY  06/20/2024   Procedure: POLYPECTOMY, INTESTINE;  Surgeon: Maryruth Ole DASEN, MD;  Location: [facility] ENDOSCOPY;  Service: Endoscopy;;   PORTACATH PLACEMENT Left 06/19/2020   Procedure: INSERTION PORT-A-CATH;  Surgeon: Dessa Reyes ORN, MD;  Location: [facility] ORS;  Service: General;  Laterality: Left;    SOCIAL HISTORY: Social History   Socioeconomic History   Marital status: Married    Spouse name: Not on file   Number of children: Not on file   Years of education: Not on file   Highest education level: Not on file  Occupational History   Not on file  Tobacco Use   Smoking status: Former    Current packs/day: 0.00    Types: Cigarettes    Start date: 11/23/1965    Quit date: 11/23/1968    Years since quitting: 55.6   Smokeless tobacco: Never  Vaping Use   Vaping status: Never Used  Substance and Sexual Activity   Alcohol use: Not Currently   Drug  use: Never   Sexual activity: Not on file  Other Topics Concern   Not on file  Social History Narrative   Lives in [place]; smoked- quit in 70s; beer social; 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; grandson    Social Drivers of Corporate investment banker Strain: Low Risk  (04/06/2024)   Received from [facility]   Overall Financial Resource Strain (CARDIA)    Difficulty of Paying Living Expenses: Not hard at all  Food Insecurity: No Food Insecurity (04/06/2024)   Received from [facility]   Hunger Vital Sign    Within the past 12 months, you worried that your food would run out before you got the money to buy more.: Never true    Within the past 12 months, the food you bought just didn't last and you didn't have money to get more.: Never true  Transportation Needs: No Transportation Needs (04/06/2024)   Received from [facility] - Transportation    In the past 12 months, has lack of transportation kept you from medical appointments or from getting medications?: No    Lack of Transportation (Non-Medical): No  Physical Activity: Not on file  Stress: Not on file  Social Connections: Not on file  Intimate Partner Violence: Not on file  FAMILY HISTORY: Family History  Problem Relation Age of Onset   Multiple sclerosis Brother     ALLERGIES:  has no known allergies.  MEDICATIONS:  Current Outpatient Medications  Medication Sig Dispense Refill   BREO ELLIPTA  100-25 MCG/INH AEPB Inhale 1 puff into the lungs at bedtime.     Butalbital-APAP-Caffeine 50-300-40 MG CAPS Take 1-2 tablets by mouth every 8 (eight) hours as needed.     fexofenadine (ALLEGRA) 180 MG tablet Take 180 mg by mouth daily.     lidocaine -prilocaine  (EMLA ) cream Apply 1 application. topically as needed. 30 g 3   lisinopril  (ZESTRIL ) 10 MG tablet Take 10 mg by mouth daily.     potassium chloride  SA (KLOR-CON  M20) 20 MEQ tablet TAKE 1 TABLET BY MOUTH TWICE A DAY  60 tablet 3   SYMBICORT 160-4.5 MCG/ACT inhaler Inhale into the lungs.     tiotropium (SPIRIVA ) 18 MCG inhalation capsule Place 18 mcg into inhaler and inhale at bedtime.      ondansetron  (ZOFRAN ) 8 MG tablet One pill every 8 hours as needed for nausea/vomitting. (Patient not taking: Reported on 07/18/2024) 40 tablet 1   prochlorperazine  (COMPAZINE ) 10 MG tablet Take 1 tablet (10 mg total) by mouth every 6 (six) hours as needed for nausea or vomiting. (Patient not taking: Reported on 07/18/2024) 40 tablet 1   No current facility-administered medications for this visit.   Facility-Administered Medications Ordered in Other Visits  Medication Dose Route Frequency Provider Last Rate Last Admin   heparin  lock flush 100 unit/mL  500 Units Intravenous Once Quanetta Truss R, MD          .  PHYSICAL EXAMINATION: ECOG PERFORMANCE STATUS: 0 - Asymptomatic  Vitals:   07/18/24 1004  BP: 122/74  Pulse: 61  Resp: 18  Temp: 97.6 F (36.4 C)  SpO2: 97%   Filed Weights   07/18/24 1004  Weight: 248 lb 11.2 oz (112.8 kg)     Physical Exam HENT:     Head: Normocephalic and atraumatic.     Mouth/Throat:     Pharynx: No oropharyngeal exudate.  Eyes:     Pupils: Pupils are equal, round, and reactive to light.  Cardiovascular:     Rate and Rhythm: Normal rate and regular rhythm.  Pulmonary:     Effort: No respiratory distress.     Breath sounds: No wheezing.     Comments: Decreased breath sounds Abdominal:     General: Bowel sounds are normal. There is no distension.     Palpations: Abdomen is soft. There is no mass.     Tenderness: There is no abdominal tenderness. There is no guarding or rebound.  Musculoskeletal:        General: No tenderness. Normal range of motion.     Cervical back: Normal range of motion and neck supple.  Skin:    General: Skin is warm.  Neurological:     Mental Status: He is alert and oriented to person, place, and time.  Psychiatric:        Mood and  Affect: Affect normal.      LABORATORY DATA:  I have reviewed the data as listed Lab Results  Component Value Date   WBC 10.0 07/18/2024   HGB 13.2 07/18/2024   HCT 39.7 07/18/2024   MCV 92.3 07/18/2024   PLT 225 07/18/2024   Recent Labs    12/20/23 0937 01/19/24 0900 07/18/24 1002  NA 138 140 139  K 4.7 4.5 3.9  CL 103 105  107  CO2 27 29 26   GLUCOSE 96 101* 106*  BUN 26* 17 16  CREATININE 1.06 0.93 1.00  CALCIUM  9.2 9.1 8.9  GFRNONAA >60 >60 >60  PROT 7.0 6.9 6.8  ALBUMIN 4.0 3.7 3.6  AST 18 17 20   ALT 15 13 16   ALKPHOS 48 41 44  BILITOT 1.9* 1.9* 2.0*    RADIOGRAPHIC STUDIES: I have personally reviewed the radiological images as listed and agreed with the findings in the report. No results found.   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 S/p  adjuvant CIV 5FU x12 cycles [finished Jan 18th, 2022]. JULY 2025- colonoscopy- adenoma- repeat in 5 years [Dr.Russow].   # FEB 2025- CT CAP-  Status post right hemicolectomy with no findings to suggest recurrent or metastatic disease in the chest, abdomen or pelvis- stable; will repeat imaging in 6 months.   #Elevated tumor marker- FEB 2025- 17-waxing & waning]- Awaiting labs from today. However, FEb 2025-- CT negative; see above;  Monitor closely.   # COPD- continue inhalers- stable.      # PIV- s/p port explanation.   # DISPOSITION: # follow up in 6 months-  MD; labs- cbc/cmp;CEA;-CT CAP-  Dr.B      All questions were answered. The patient knows to call the clinic with any problems, questions or concerns.    James JONELLE Joe, MD 07/18/2024 11:07 AM

## 2024-07-18 NOTE — Progress Notes (Signed)
 Patient has no concerns

## 2024-07-19 LAB — CEA: CEA: 12 ng/mL — ABNORMAL HIGH (ref 0.0–4.7)

## 2025-01-02 ENCOUNTER — Ambulatory Visit

## 2025-01-16 ENCOUNTER — Ambulatory Visit: Admitting: Internal Medicine

## 2025-01-16 ENCOUNTER — Other Ambulatory Visit
